# Patient Record
Sex: Female | Born: 1935 | Race: White | Hispanic: No | Smoking: Never smoker
Health system: Southern US, Community
[De-identification: ages and names within clinical notes are randomized; demographics above are authoritative.]

## PROBLEM LIST (undated history)

## (undated) DIAGNOSIS — E785 Hyperlipidemia, unspecified: Secondary | ICD-10-CM

## (undated) DIAGNOSIS — F329 Major depressive disorder, single episode, unspecified: Secondary | ICD-10-CM

## (undated) DIAGNOSIS — F32A Depression, unspecified: Secondary | ICD-10-CM

## (undated) DIAGNOSIS — I10 Essential (primary) hypertension: Secondary | ICD-10-CM

## (undated) DIAGNOSIS — K851 Biliary acute pancreatitis without necrosis or infection: Secondary | ICD-10-CM

## (undated) DIAGNOSIS — F0390 Unspecified dementia without behavioral disturbance: Secondary | ICD-10-CM

## (undated) DIAGNOSIS — C569 Malignant neoplasm of unspecified ovary: Secondary | ICD-10-CM

## (undated) DIAGNOSIS — I4891 Unspecified atrial fibrillation: Secondary | ICD-10-CM

## (undated) DIAGNOSIS — I351 Nonrheumatic aortic (valve) insufficiency: Secondary | ICD-10-CM

## (undated) HISTORY — PX: ABDOMINAL HYSTERECTOMY: SHX81

---

## 2000-05-17 ENCOUNTER — Encounter: Admission: RE | Admit: 2000-05-17 | Discharge: 2000-05-17 | Payer: Self-pay

## 2000-05-24 ENCOUNTER — Encounter: Admission: RE | Admit: 2000-05-24 | Discharge: 2000-05-24 | Payer: Self-pay | Admitting: *Deleted

## 2000-05-24 ENCOUNTER — Encounter: Payer: Self-pay | Admitting: *Deleted

## 2000-09-09 ENCOUNTER — Ambulatory Visit (HOSPITAL_COMMUNITY): Admission: RE | Admit: 2000-09-09 | Discharge: 2000-09-09 | Payer: Self-pay | Admitting: *Deleted

## 2000-09-09 ENCOUNTER — Encounter (INDEPENDENT_AMBULATORY_CARE_PROVIDER_SITE_OTHER): Payer: Self-pay

## 2000-09-09 ENCOUNTER — Encounter: Payer: Self-pay | Admitting: *Deleted

## 2000-11-27 ENCOUNTER — Emergency Department (HOSPITAL_COMMUNITY): Admission: EM | Admit: 2000-11-27 | Discharge: 2000-11-27 | Payer: Self-pay

## 2000-12-04 ENCOUNTER — Emergency Department (HOSPITAL_COMMUNITY): Admission: EM | Admit: 2000-12-04 | Discharge: 2000-12-04 | Payer: Self-pay | Admitting: Emergency Medicine

## 2001-04-30 ENCOUNTER — Emergency Department (HOSPITAL_COMMUNITY): Admission: EM | Admit: 2001-04-30 | Discharge: 2001-04-30 | Payer: Self-pay | Admitting: Emergency Medicine

## 2001-04-30 ENCOUNTER — Encounter: Payer: Self-pay | Admitting: Emergency Medicine

## 2001-07-16 ENCOUNTER — Encounter: Admission: RE | Admit: 2001-07-16 | Discharge: 2001-07-16 | Payer: Self-pay | Admitting: Orthopedic Surgery

## 2001-07-16 ENCOUNTER — Encounter: Payer: Self-pay | Admitting: Orthopedic Surgery

## 2001-07-17 ENCOUNTER — Ambulatory Visit (HOSPITAL_BASED_OUTPATIENT_CLINIC_OR_DEPARTMENT_OTHER): Admission: RE | Admit: 2001-07-17 | Discharge: 2001-07-17 | Payer: Self-pay | Admitting: Orthopedic Surgery

## 2001-09-09 ENCOUNTER — Encounter (INDEPENDENT_AMBULATORY_CARE_PROVIDER_SITE_OTHER): Payer: Self-pay | Admitting: *Deleted

## 2003-11-08 ENCOUNTER — Emergency Department (HOSPITAL_COMMUNITY): Admission: EM | Admit: 2003-11-08 | Discharge: 2003-11-08 | Payer: Self-pay | Admitting: Family Medicine

## 2004-12-17 ENCOUNTER — Encounter (INDEPENDENT_AMBULATORY_CARE_PROVIDER_SITE_OTHER): Payer: Self-pay | Admitting: *Deleted

## 2004-12-18 ENCOUNTER — Inpatient Hospital Stay (HOSPITAL_COMMUNITY): Admission: EM | Admit: 2004-12-18 | Discharge: 2004-12-22 | Payer: Self-pay | Admitting: Emergency Medicine

## 2005-01-24 ENCOUNTER — Ambulatory Visit: Payer: Self-pay | Admitting: Cardiology

## 2005-01-29 ENCOUNTER — Ambulatory Visit: Payer: Self-pay | Admitting: Nurse Practitioner

## 2005-03-21 ENCOUNTER — Encounter (INDEPENDENT_AMBULATORY_CARE_PROVIDER_SITE_OTHER): Payer: Self-pay | Admitting: Specialist

## 2005-03-22 ENCOUNTER — Inpatient Hospital Stay (HOSPITAL_COMMUNITY): Admission: RE | Admit: 2005-03-22 | Discharge: 2005-03-25 | Payer: Self-pay | Admitting: Surgery

## 2005-03-22 ENCOUNTER — Encounter (INDEPENDENT_AMBULATORY_CARE_PROVIDER_SITE_OTHER): Payer: Self-pay | Admitting: *Deleted

## 2005-03-25 ENCOUNTER — Encounter (INDEPENDENT_AMBULATORY_CARE_PROVIDER_SITE_OTHER): Payer: Self-pay | Admitting: *Deleted

## 2006-05-24 ENCOUNTER — Encounter: Admission: RE | Admit: 2006-05-24 | Discharge: 2006-05-24 | Payer: Self-pay | Admitting: Orthopaedic Surgery

## 2006-05-29 ENCOUNTER — Emergency Department (HOSPITAL_COMMUNITY): Admission: EM | Admit: 2006-05-29 | Discharge: 2006-05-29 | Payer: Self-pay | Admitting: Emergency Medicine

## 2006-06-10 ENCOUNTER — Emergency Department (HOSPITAL_COMMUNITY): Admission: EM | Admit: 2006-06-10 | Discharge: 2006-06-10 | Payer: Self-pay | Admitting: Emergency Medicine

## 2006-08-02 ENCOUNTER — Emergency Department (HOSPITAL_COMMUNITY): Admission: EM | Admit: 2006-08-02 | Discharge: 2006-08-02 | Payer: Self-pay | Admitting: Emergency Medicine

## 2006-08-12 IMAGING — RF DG CHOLANGIOGRAM OPERATIVE
1 series · 12 of 12 positions shown · non-contrast
Comparison: none

CLINICAL DATA: Cholecystectomy for gallstones.  
 INTRAOPERATIVE CHOLANGIOGRAM:

[Series 1: run · 3 acquisitions, 12 frames shown]
[im 1/3]
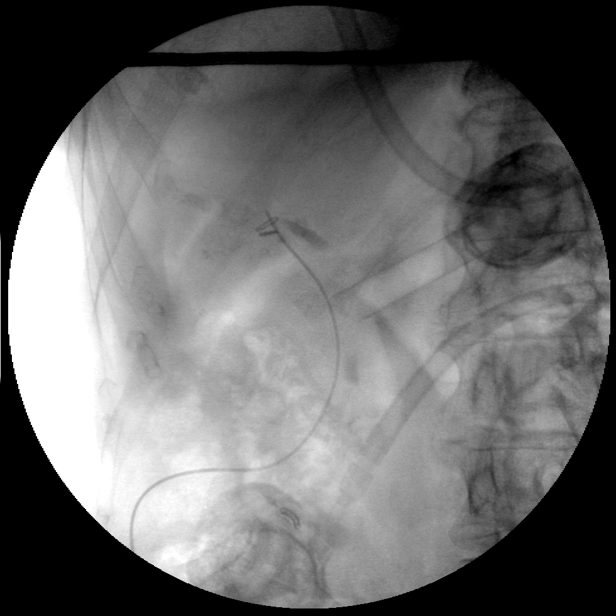
[im 1/3]
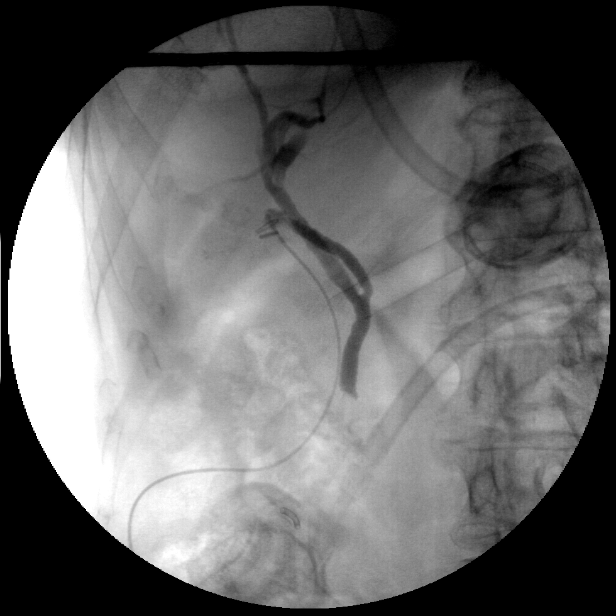
[im 1/3]
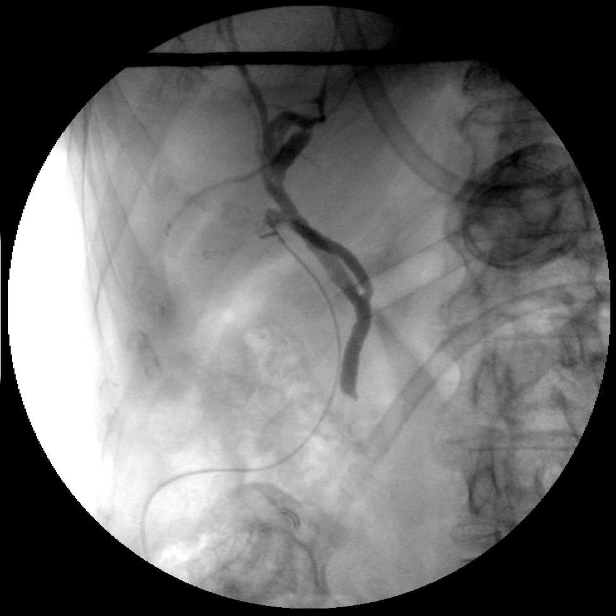
[im 1/3]
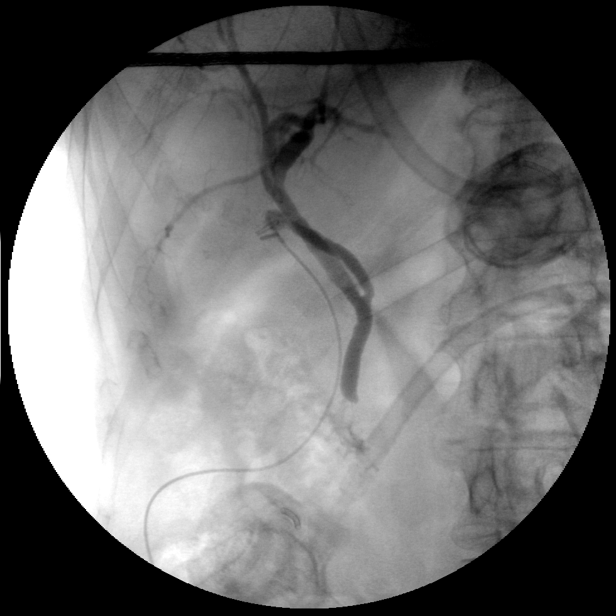
[im 2/3]
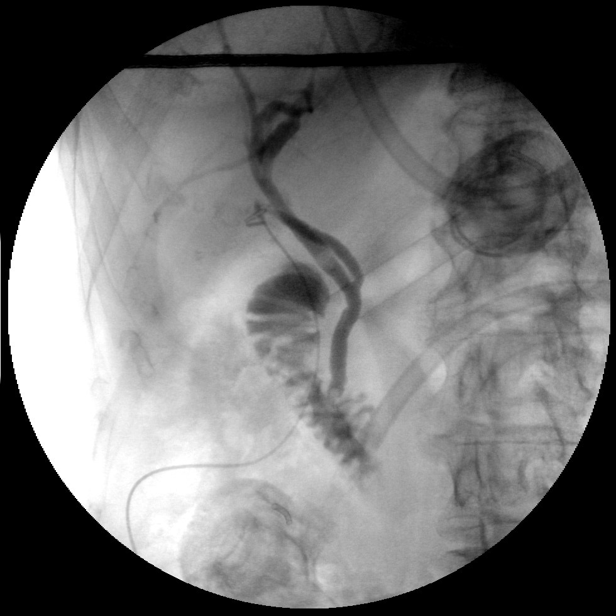
[im 2/3]
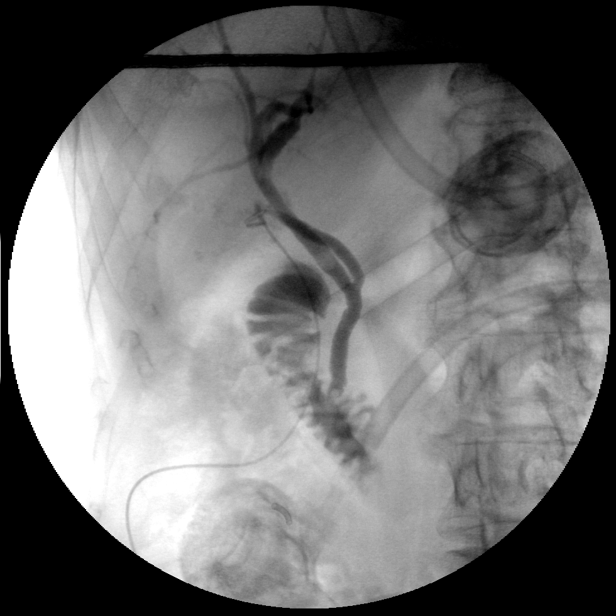
[im 2/3]
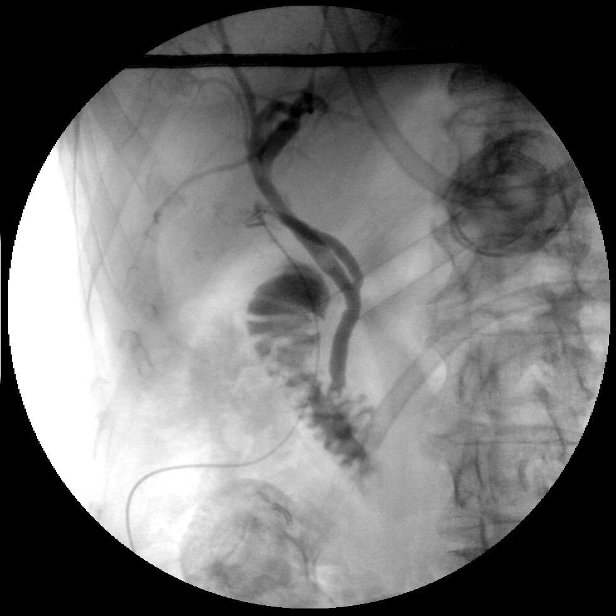
[im 2/3]
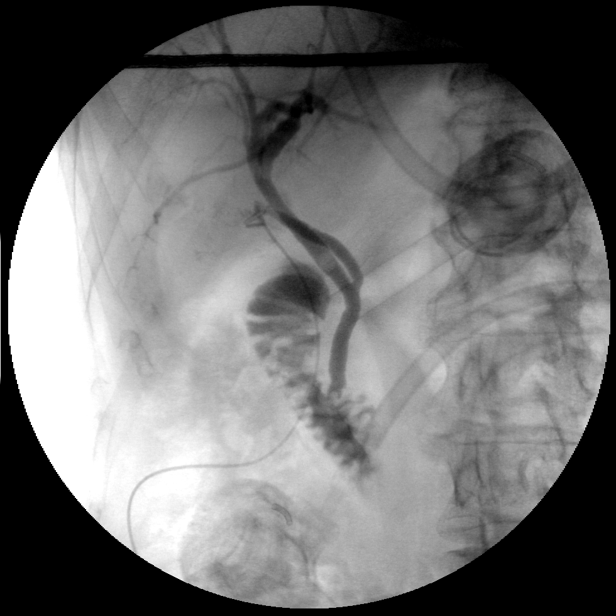
[im 3/3]
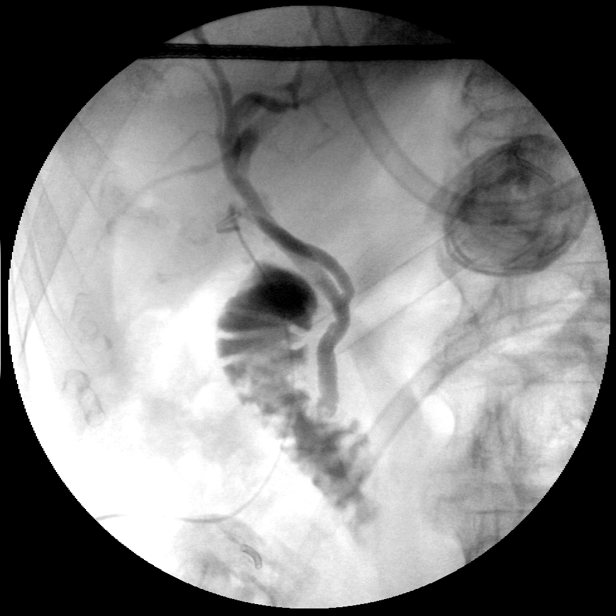
[im 3/3]
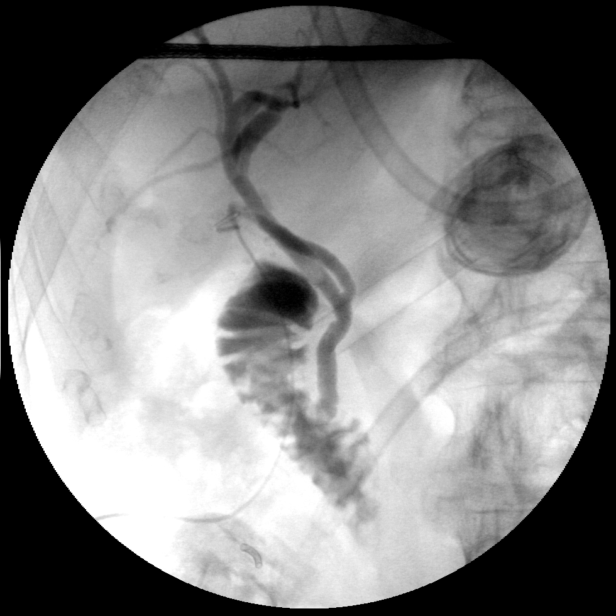
[im 3/3]
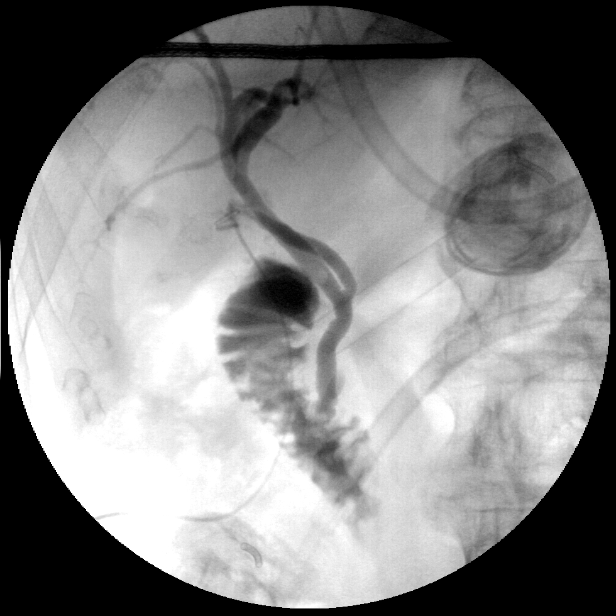
[im 3/3]
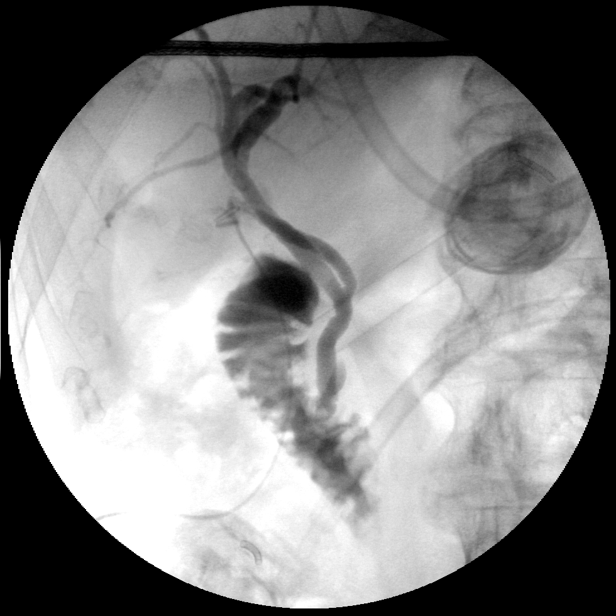

[12 of 12 positions shown; findings below may reference images not displayed]

FINDINGS: Imaging obtained with a C-arm demonstrates contrast injection run from intraoperative cholangiogram.  There is variant elongated cystic duct with low insertion.  There is persistent suggestion of a small rounded focal filling defect in the distal common bile duct just above the ampulla which is not obstructive to the flow of contrast to the duodenum.  Focal small retained calculus is not excluded.  The rest of the opacified biliary tree shows no filling defects.  No extravasation of contrast material is present.
IMPRESSION: Intraoperative cholangiogram shows suggestion of rounded filling defect in the distal common bile duct potentially representing a small retained calculus.  There is incidental elongated cystic duct with low insertion.

## 2006-08-16 ENCOUNTER — Encounter: Admission: RE | Admit: 2006-08-16 | Discharge: 2006-08-16 | Payer: Self-pay | Admitting: Orthopaedic Surgery

## 2006-12-13 ENCOUNTER — Emergency Department (HOSPITAL_COMMUNITY): Admission: EM | Admit: 2006-12-13 | Discharge: 2006-12-14 | Payer: Self-pay | Admitting: Emergency Medicine

## 2006-12-23 ENCOUNTER — Emergency Department (HOSPITAL_COMMUNITY): Admission: EM | Admit: 2006-12-23 | Discharge: 2006-12-23 | Payer: Self-pay | Admitting: Emergency Medicine

## 2007-02-19 ENCOUNTER — Encounter: Admission: RE | Admit: 2007-02-19 | Discharge: 2007-02-19 | Payer: Self-pay | Admitting: Internal Medicine

## 2007-07-04 ENCOUNTER — Ambulatory Visit: Payer: Self-pay | Admitting: Cardiology

## 2007-07-04 LAB — CONVERTED CEMR LAB
ALT: 13 units/L (ref 0–35)
AST: 20 units/L (ref 0–37)
Bilirubin, Direct: 0.1 mg/dL (ref 0.0–0.3)
Cholesterol: 162 mg/dL (ref 0–200)
HDL: 55.5 mg/dL (ref >39.0)
Total Bilirubin: 0.6 mg/dL (ref 0.3–1.2)
Total Protein: 6.6 g/dL (ref 6.0–8.3)

## 2007-07-15 ENCOUNTER — Ambulatory Visit: Payer: Self-pay

## 2007-09-02 ENCOUNTER — Ambulatory Visit: Payer: Self-pay | Admitting: Cardiovascular Disease

## 2007-10-08 ENCOUNTER — Encounter: Admission: RE | Admit: 2007-10-08 | Discharge: 2007-10-08 | Payer: Self-pay | Admitting: Orthopedic Surgery

## 2007-10-10 ENCOUNTER — Ambulatory Visit (HOSPITAL_BASED_OUTPATIENT_CLINIC_OR_DEPARTMENT_OTHER): Admission: RE | Admit: 2007-10-10 | Discharge: 2007-10-10 | Payer: Self-pay | Admitting: Orthopedic Surgery

## 2008-03-03 ENCOUNTER — Encounter: Admission: RE | Admit: 2008-03-03 | Discharge: 2008-03-03 | Payer: Self-pay | Admitting: Internal Medicine

## 2008-04-16 DIAGNOSIS — E782 Mixed hyperlipidemia: Secondary | ICD-10-CM | POA: Insufficient documentation

## 2008-04-16 DIAGNOSIS — M79609 Pain in unspecified limb: Secondary | ICD-10-CM | POA: Insufficient documentation

## 2008-04-16 DIAGNOSIS — I10 Essential (primary) hypertension: Secondary | ICD-10-CM | POA: Insufficient documentation

## 2008-04-16 DIAGNOSIS — R079 Chest pain, unspecified: Secondary | ICD-10-CM | POA: Insufficient documentation

## 2008-07-13 ENCOUNTER — Encounter: Payer: Self-pay | Admitting: Internal Medicine

## 2008-07-14 ENCOUNTER — Encounter: Payer: Self-pay | Admitting: Internal Medicine

## 2008-07-14 ENCOUNTER — Ambulatory Visit: Payer: Self-pay | Admitting: Internal Medicine

## 2008-07-14 ENCOUNTER — Inpatient Hospital Stay (HOSPITAL_COMMUNITY): Admission: AD | Admit: 2008-07-14 | Discharge: 2008-07-17 | Payer: Self-pay | Admitting: Internal Medicine

## 2008-07-14 ENCOUNTER — Telehealth: Payer: Self-pay | Admitting: Internal Medicine

## 2008-07-14 DIAGNOSIS — K219 Gastro-esophageal reflux disease without esophagitis: Secondary | ICD-10-CM | POA: Insufficient documentation

## 2008-07-14 DIAGNOSIS — D649 Anemia, unspecified: Secondary | ICD-10-CM

## 2008-07-14 DIAGNOSIS — I739 Peripheral vascular disease, unspecified: Secondary | ICD-10-CM | POA: Insufficient documentation

## 2008-07-14 DIAGNOSIS — F329 Major depressive disorder, single episode, unspecified: Secondary | ICD-10-CM

## 2008-07-14 DIAGNOSIS — K921 Melena: Secondary | ICD-10-CM | POA: Insufficient documentation

## 2008-07-14 DIAGNOSIS — M1990 Unspecified osteoarthritis, unspecified site: Secondary | ICD-10-CM | POA: Insufficient documentation

## 2008-07-14 DIAGNOSIS — M810 Age-related osteoporosis without current pathological fracture: Secondary | ICD-10-CM | POA: Insufficient documentation

## 2008-07-14 DIAGNOSIS — Z8719 Personal history of other diseases of the digestive system: Secondary | ICD-10-CM | POA: Insufficient documentation

## 2008-07-14 DIAGNOSIS — C569 Malignant neoplasm of unspecified ovary: Secondary | ICD-10-CM

## 2008-07-15 ENCOUNTER — Encounter: Payer: Self-pay | Admitting: Gastroenterology

## 2009-01-12 ENCOUNTER — Encounter: Admission: RE | Admit: 2009-01-12 | Discharge: 2009-01-12 | Payer: Self-pay | Admitting: Rheumatology

## 2009-01-23 ENCOUNTER — Inpatient Hospital Stay (HOSPITAL_COMMUNITY): Admission: EM | Admit: 2009-01-23 | Discharge: 2009-01-28 | Payer: Self-pay | Admitting: Emergency Medicine

## 2010-08-11 LAB — BASIC METABOLIC PANEL
CO2: 28 mEq/L (ref 19–32)
Calcium: 8.4 mg/dL (ref 8.4–10.5)
Calcium: 8.7 mg/dL (ref 8.4–10.5)
GFR calc Af Amer: >60 mL/min (ref >60)
GFR calc Af Amer: >60 mL/min (ref >60)
GFR calc non Af Amer: >60 mL/min (ref >60)
GFR calc non Af Amer: >60 mL/min (ref >60)
Sodium: 137 mEq/L (ref 135–145)
Sodium: 138 mEq/L (ref 135–145)

## 2010-08-11 LAB — URINE MICROSCOPIC-ADD ON

## 2010-08-11 LAB — DIFFERENTIAL
Lymphocytes Relative: 6 % — ABNORMAL LOW (ref 12–46)
Monocytes Absolute: 0.4 10*3/uL (ref 0.1–1.0)
Monocytes Relative: 3 % (ref 3–12)
Neutro Abs: 11.1 10*3/uL — ABNORMAL HIGH (ref 1.7–7.7)

## 2010-08-11 LAB — CARDIAC PANEL(CRET KIN+CKTOT+MB+TROPI)
CK, MB: 2.4 ng/mL (ref 0.3–4.0)
CK, MB: 3.8 ng/mL (ref 0.3–4.0)
Total CK: 130 U/L (ref 7–177)
Total CK: 155 U/L (ref 7–177)
Troponin I: 0.01 ng/mL (ref 0.00–0.06)

## 2010-08-11 LAB — URINALYSIS, ROUTINE W REFLEX MICROSCOPIC
Bilirubin Urine: NEGATIVE
Ketones, ur: NEGATIVE mg/dL
Nitrite: NEGATIVE
Protein, ur: NEGATIVE mg/dL
Urobilinogen, UA: 0.2 mg/dL (ref 0.0–1.0)
pH: 6.5 (ref 5.0–8.0)

## 2010-08-11 LAB — CULTURE, BLOOD (ROUTINE X 2)
Culture: NO GROWTH
Culture: NO GROWTH

## 2010-08-11 LAB — CBC
Hemoglobin: 13.4 g/dL (ref 12.0–15.0)
Hemoglobin: 14.4 g/dL (ref 12.0–15.0)
MCHC: 33.8 g/dL (ref 30.0–36.0)
RBC: 4.21 MIL/uL (ref 3.87–5.11)
RBC: 4.53 MIL/uL (ref 3.87–5.11)
RDW: 13.6 % (ref 11.5–15.5)

## 2010-08-11 LAB — VITAMIN B12: Vitamin B-12: 400 pg/mL (ref 211–911)

## 2010-08-11 LAB — TSH: TSH: 2.987 u[IU]/mL (ref 0.350–4.500)

## 2010-08-17 LAB — DIFFERENTIAL
Basophils Absolute: 0.1 10*3/uL (ref 0.0–0.1)
Eosinophils Absolute: 0.1 10*3/uL (ref 0.0–0.7)
Lymphs Abs: 1 10*3/uL (ref 0.7–4.0)
Monocytes Absolute: 0.4 10*3/uL (ref 0.1–1.0)
Neutro Abs: 3.5 10*3/uL (ref 1.7–7.7)

## 2010-08-17 LAB — CBC
HCT: 30.2 % — ABNORMAL LOW (ref 36.0–46.0)
Hemoglobin: 10 g/dL — ABNORMAL LOW (ref 12.0–15.0)
Hemoglobin: 10 g/dL — ABNORMAL LOW (ref 12.0–15.0)
Hemoglobin: 6.7 g/dL — CL (ref 12.0–15.0)
MCHC: 32.9 g/dL (ref 30.0–36.0)
MCV: 71.9 fL — ABNORMAL LOW (ref 78.0–100.0)
MCV: 77 fL — ABNORMAL LOW (ref 78.0–100.0)
Platelets: 292 10*3/uL (ref 150–400)
RBC: 3.85 MIL/uL — ABNORMAL LOW (ref 3.87–5.11)
RBC: 3.91 MIL/uL (ref 3.87–5.11)
RDW: 21 % — ABNORMAL HIGH (ref 11.5–15.5)
WBC: 5.5 10*3/uL (ref 4.0–10.5)
WBC: 5.6 10*3/uL (ref 4.0–10.5)

## 2010-08-17 LAB — URINALYSIS, ROUTINE W REFLEX MICROSCOPIC
Bilirubin Urine: NEGATIVE
Ketones, ur: NEGATIVE mg/dL
Protein, ur: NEGATIVE mg/dL
Specific Gravity, Urine: 1.01 (ref 1.005–1.030)

## 2010-08-17 LAB — CROSSMATCH

## 2010-08-17 LAB — PROTIME-INR
INR: 1.1 (ref 0.00–1.49)
Prothrombin Time: 14 seconds (ref 11.6–15.2)

## 2010-08-17 LAB — COMPREHENSIVE METABOLIC PANEL
AST: 19 U/L (ref 0–37)
Albumin: 3.6 g/dL (ref 3.5–5.2)
Alkaline Phosphatase: 64 U/L (ref 39–117)
BUN: 21 mg/dL (ref 6–23)
CO2: 25 mEq/L (ref 19–32)
Chloride: 108 mEq/L (ref 96–112)
Potassium: 3.7 mEq/L (ref 3.5–5.1)
Total Bilirubin: 0.4 mg/dL (ref 0.3–1.2)

## 2010-08-17 LAB — APTT: aPTT: 30 seconds (ref 24–37)

## 2010-08-17 LAB — TSH: TSH: 2.712 u[IU]/mL (ref 0.350–4.500)

## 2010-08-17 LAB — IRON: Iron: <10 ug/dL — ABNORMAL LOW (ref 42–135)

## 2010-08-17 LAB — URINE MICROSCOPIC-ADD ON

## 2010-09-19 NOTE — Discharge Summary (Signed)
[name], [name]                 ACCOUNT NO.:  [id number]   MEDICAL RECORD NO.:  [id number]           PATIENT TYPE:   LOCATION:                                 FACILITY:   PHYSICIAN:  Hedwig Morton. Juanda Chance, MD     DATE OF BIRTH:  Sep 12, 1918   DATE OF ADMISSION:  DATE OF DISCHARGE:                               DISCHARGE SUMMARY   ADMITTING DIAGNOSES:  1. Severe iron-deficiency anemia, she is microcytic.  She is Hemoccult      positive and has a self-reported history of black tarry stools.      Her BUN is also elevated.  All of which point to an upper      gastrointestinal source for her bleeding.  This may be secondary to      aspirin-induced ulcer, arteriovenous malformations, or neoplasia.      It is less likely that she has a lower gastrointestinal source,      such as, a large polyp or colon cancer or other lower      gastrointestinal abnormality.  Anemia symptomatic.  She is quite      weak.  2. History of pancreatitis:  She underwent in November 2006 endoscopic      retrograde cholangiopancreatography with sphincterotomy by Dr.      Evette Cristal.  3. Status post in November 2006 cholelithiasis, this led to gallstone      pancreatitis.  Laparoscopic cholecystectomy was performed by Dr.      Corliss Skains.  4. Peripheral vascular disease.  5. History of gastroesophageal reflux disease when the upper endoscope      by Dr. Virginia Rochester, in 2003.  6. Osteoarthritis.  7. Osteoporosis.  8. Depression.  9. Hypertension.  10.Hyperlipidemia.  11.Status post hysterectomy in 1966 secondary to cervical cancer.  12.Status post remote appendectomy.  13.History of fracture of left upper extremity for which she underwent      open reduction and internal fixation.  14.History of adhesive capsulitis of the left shoulder.  She underwent      closed manipulation of the left shoulder under anesthesia in 2003.  15.Status post history of bilateral cataract surgery.   DISCHARGE DIAGNOSES:  1. Upper gastrointestinal  bleed.  The exact source of the bleed is      indeterminate at the time of this dictation, but it is suspected to      be secondary to arteriovenous malformations.  2. Status post upper endoscopy on July 15, 2008, by Dr. Melvia Heaps.  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  This was fulgurated      with the APC coagulator.  Hiatal hernia also seen on endoscopy.  3. Status post colonoscopy on July 15, 2008.  This study into the      distal ileum did not show any pathology.  4. Capsule endoscopy being performed currently at the time of this      dictation with findings to be determined within the next few days.  5. Iron-deficiency anemia, suggestive  of chronic blood loss:  She      underwent transfusion with 2 units of packed red blood cells during      this admission with post transfusion blood counts stable.  6. Chest x-ray showing a possible 6-mm left lower lobe nodule.  CT      scan was performed, showed inflammatory changes involving lingular      segment, which is likely chronic and subacute in nature, possibly      this could represented an atypical mycobacterial infection of the      lungs.   LABORATORY DATA:  Hemoglobin 6.7 on admission, 10.0 at discharge.  Hematocrit 20.2 on admission, 29.6 at discharge.  MCV is 71.9.  Platelet  count 292,000.  White blood cell count 5.1.  ESR was 14.  PT 14.0, INR  1.1, PTT 30.  Sodium 141, potassium 3.7, chloride 108, CO2 25, glucose  117, BUN 21, creatinine 0.68.  LFTs within normal limits.  TSH level  2.712.  Iron less than 10.  Total iron binding capacity and iron  saturation were not calculated.  Urinalysis was displayed 0-2 white  blood cells, 0-2 red blood cells per high-power field, rare bacteria and  trace leukocytes present.   X-RAYS:  1. Single-view abdomen showed no abnormalities.  2. Two-view chest showed possible 6-mm left lower lobe nodule.  CT       suggested to further evaluate.  3. CT chest with contrast showed inflammatory changes involving the      lingular segment, which are likely subacute and chronic in nature.      One possibility would be atypical mycobacterial infection of the      lung.   BRIEF HISTORY:  [name] is an 77 year old white female who presented  to her primary physician, Dr. Ethelene Browns Anderson's office for evaluation  of fatigue and recent history of dark stools.  He obtained blood cells,  and the hemoglobin returned at 7.3.  Her usual GI doctor is Dr. Virginia Rochester.  Dr. Virginia Rochester was out of town, so Dr. Dareen Piano referred her for evaluation to  Dr. Juanda Chance.  She was added on for evaluation and ended up being admitted  to the hospital for transfusion and workup of the anemia and a history  of dark tarry stools and heme positivity on fecal occult blood testing.  Her MCV was low.   The patient gave a history of having been taking 2-4 aspirin daily until  about a week prior to  presenting to the office and subsequently being  admitted to the hospital.   HOSPITAL COURSE:  The patient was admitted to nontelemetry bed.  She was  transfused with a total of 2 units of packed red blood cells.  The  initial hemoglobin checks showed a measurement of 6.7, it rose to 10  with the transfusion.  It remained stable at 10 on recheck.   The patient underwent the upper endoscopy and colonoscopy with finding  of a duodenal AVM.  There was a suspicion for further AVMs, deeper  within the small bowel and therefore, underwent capsule endoscopy,  performed on July 16, 2008.  At the time of this dictation, the capsule  endoscopy is still in progress and images will be downloaded within the  next few days and readings to take place thereafter.   The patient also had an irregular finding on her chest x-ray.  This was  followed up with a CT scan, which raised the remote possibility of an  atypical  mycobacterial infection.  The patient has not had  fever,  chills, sweats, weight loss, or respiratory problems and it is felt  unlikely that she has any sort of mycobacterial infection.  However, she  probably should have a PPD placed.  Since the plan is to send her home  soon, we will let Dr. Dareen Piano supervise any placement of PPDs or  further workup of the lung findings.  The concern is that if she has a  PPD placed here today that she will not get followed up with a reading  within the required a 3-day period.   The patient's diet was advanced after the colonoscopy prep to a regular  diet, which she tolerated well.  She was still complaining of some  weakness but nothing like that she had been experiencing prior to  arrival.  Her stools turned to brown.  She was in stable condition and  felt ready for discharge following completion of the capsule endoscopy.  As this may be later today, it is possible that she will go home on  July 17, 2008, rather than July 16, 2008.   MEDICATIONS AT DISCHARGE:  1. Pentoxifylline ER 400 mg once daily.  2. Pravastatin 80 mg once daily.  3. Omeprazole 20 mg once daily.  4. Venlafaxine HCl 75 mg twice daily.  5. Atenolol 25 mg once daily.  6. Colchicine 0.6 mg once daily.  7. Lorazepam 1 mg at bedtime.  8. Tramadol 50 mg 1 twice daily as needed.  9. Actonel 35 mg once a week every Monday.  10.Os-Cal 500 mg with vitamin D once daily.   The patient is to continue not to use aspirin products.   DIET AT DISCHARGE:  Regular.   FOLLOWUP OFFICE VISIT:  1. She should follow up with Dr. Vivien Rossetti office for a CBC within      the next 10 days.  2. Depending on what is found on the reading of the capsule endoscopy,      she may need to follow up with Dr. Virginia Rochester.  3. Again, she should follow up regarding whether or not she needs a      PPD placed with Dr. Leatha Gilding within the next 10 days.      Jennye Moccasin, PA-C      Hedwig Morton. Juanda Chance, MD  Electronically Signed    SG/MEDQ  D:  07/16/2008   T:  07/17/2008  Job:  [id number]   cc:   Georgiana Spinner, M.D.  Dr. Lurena Nida

## 2010-09-19 NOTE — Op Note (Signed)
NAMEWILFRED, SIVERSON                 ACCOUNT NO.:  [id number]   MEDICAL RECORD NO.:  [id number]          PATIENT TYPE:  AMB   LOCATION:  DSC                          FACILITY:  [facility]   PHYSICIAN:  Katy Fitch. Sypher, M.D. DATE OF BIRTH:  [birth year]   DATE OF PROCEDURE:  10/10/2007  DATE OF DISCHARGE:                               OPERATIVE REPORT   PREOPERATIVE DIAGNOSIS:  Severely comminuted shortened fracture of left  distal radial metaphysis and distal ulnar metaphysis.   POSTOPERATIVE DIAGNOSIS:  Severely comminuted shortened fracture of left  distal radial metaphysis and distal ulnar metaphysis.   OPERATIONS:  Open reduction and internal fixation of left radius  utilizing a six peg DVR plate system and a closed/open reduction of the  left ulna without supplemental fixation.   OPERATING SURGEON:  Josephine Igo, MD   ASSISTANT:  Marveen Reeks. Dasnoit PA-C   ANESTHESIA:  Left infraclavicular block supplemented by general  anesthesia by LMA.   SUPERVISING ANESTHESIOLOGIST:  Janetta Hora. Gelene Mink, MD   INDICATIONS:  Quaniya Damas is a 88-year woman referred through her family  for evaluation and management of a very comminuted left distal double  bone forearm fracture.   Ms. Laureano has been a long-term patient of our practice.   She fell while visiting [place], [place] on Oct 02, 2007.  She is  seen at [facility] where her comminuted fracture was identified and  splinted.  She was advised to seek a hand surgery follow-up.  Her family  brought her for an upper extremity orthopedic consult at our office this  week.  Arrangements were made for definitive open reduction and internal  fixation of her fracture at this time.   Alternative treatment strategies including closed reduction, bridge  plating and external fixation were discussed and all rejected in favor  of volar plating.  But we advised her that we would either closed  reduce, open reduced or plate/pin her ulna.   At  the time of our reduction, we were able to achieve a satisfactory  closed reduction in the ulna, therefore, we will allow the radius to  provide distraction of the ulna at this time.   PROCEDURE:  Zniyah Midkiff is brought to the operating room and placed in  supine position on the table.   Following a anesthesia consult with Dr. Gelene Mink in the holding area, a  left infraclavicular block was placed without complication.  She was  subsequently brought to room 2, placed in supine position on the  operating table and under Dr. Thornton Dales direct supervision, general  anesthesia by LMA technique induced.   The left upper extremity was prepped with Betadine soap solution and  sterilely draped.  Ancef 1 g was administered as an IV prophylactic  antibiotic one hour prior to surgery.   The arm was carefully exsanguinated with an Esmarch bandage and arterial  tourniquet inflated to 230 mmHg.  The procedure commenced with extended  DVR extensile exposure to allow visualization of the pronator flexor  carpi radialis tendon and the palmaris longus.  The sheath of the flexor  carpi  radialis was split longitudinally and the flexor pollicis longus  retracted in ulnar direction.  The pronator quadratus was meticulously  elevated off the volar aspect of the radius as there was marked  comminution of the volar cortex with several fragments more than 90  degrees malrotated.   Ultimately, the volar cortex was reassembled anatomically like a jigsaw  puzzle.  At least, five fragments constituted the anterior cortex of the  radius.   A six peg DVR plate system was applied and placed with a provisional  screw in the sliding hole..  The C-arm fluoroscope was used to gain  length and adjust the proper position of the plate.  Ultimately, a  combination of four smooth pegs radially and two threaded pegs ulnarly  were placed.  Anatomic reduction of the radius was achieved.   With gentle closed manipulation  followed by open manipulation of the  radius and ulna, we achieved a virtually anatomic reduction of the ulna.   Given an intact interosseous membrane and periosteal sleeve and my  judgment plating or pin fixation of the ulna was not likely to improve  the outcome, therefore we deferred.   The wound was then inspected for bleeding points which were  electrocauterized with bipolar current followed by repair of the  pronator quadratus over the plate with figure-of-eight suture of 0  Vicryl followed by repair of the skin with subcutaneous suture of 4-0  Vicryl and intradermal 3-0 Prolene.  A voluminous gauze dressing was  applied with a sugar-tong splint.  Ms. Kahan tolerated the surgery and  anesthesia well.  She was transferred to the recovery room with stable  signs.   I will see her back for followup in 5 days at which time I will remove  her dressing and place her on a very light weight plastic splint.      Katy Fitch Sypher, M.D.  Electronically Signed     RVS/MEDQ  D:  10/10/2007  T:  10/11/2007  Job:  [id number]   cc:   Miguel Aschoff, M.D.

## 2010-09-19 NOTE — Assessment & Plan Note (Signed)
[facility]                            CARDIOLOGY OFFICE NOTE   NAME:Brown, Maria WIEDEL                        MRN:          [id number]  DATE:07/04/2007                            DOB:          [birth year]    Mrs. Mcelhinny is a pleasant 77 year old female who presents for evaluation  of chest pain, lower extremity pain, lower extremity edema, and  hyperlipidemia.  She was seen in his office in September 2006 for  preoperative consultation.  She apparently has a history of peripheral  vascular disease not requiring any intervention, but I do not have those  records available.  She does not have dyspnea on exertion, orthopnea or  PND.  She does not have palpitations.  She occasionally feels  presyncopal when she stands but otherwise has not had syncope.  She does  state that she occasionally has chest pain.  The pain is in the left  lower chest/left upper quadrant area.  She finds it difficult described  but it states that lasts for approximately 5 minutes.  Is not exertional  nor is it pleuritic positional.  Is not related to food.  There is no  associated nausea, vomiting, shortness of breath, or diaphoresis.  The  pain does not radiate.  She also complains of some swelling in her left  lower extremity.  She states she has had some swelling for quite some  time related to varicose veins.  It has worsened but she is not wearing  her support hose.  It does get worse through the day and improve  overnight.  She also has some pain in her right upper shoulder and right  hand.  She has not seen a physician in over 1 year.  Because of that,  she presented for further evaluation.  Her medications include atenolol  25 mg daily, pentoxifylline 40 mg p.o. daily, Pravachol 80 mg daily,  omeprazole 20 mg daily, Actonel 35 mg weekly, fexofenadine 75 mg daily,  lorazepam 1 mg as needed, aspirin, and Os-Cal.  She has an allergy to  HYDROCODONE.   SOCIAL HISTORY:  She does  not smoke nor does she consume alcohol.   FAMILY HISTORY:  Negative for coronary disease in her immediate family.   PAST MEDICAL HISTORY:  There is no diabetes mellitus but there is  hypertension and hyperlipidemia.  She has history of gastric reflux  disease.  She also has arthritis.  She has had a prior hysterectomy and  appendectomy.  She has a history of gallstone pancreatitis that required  cholecystectomy.  There is a question history of peripheral vascular  disease.  There was a history of depression as well.   REVIEW OF SYSTEMS:  She denies any headaches, fevers, chills.  No  productive cough or hemoptysis.  There is no dysphagia melena or  hematochezia.  There is no dysuria or seizure activity.  There is no  orthopnea or PND but there is mild pedal edema.  She also complains of  pain in her left lower extremity.  This is from the knee down.  Occurs  when standing  but she states it tingles and aches most of the time.  It  does not change with exertion.  She does not have exertional leg pain.  Remaining systems are negative.   PHYSICAL EXAMINATION:  Today shows a heart rate is 83.  BP 134/67.  She is well-developed, well-nourished in no acute distress.  She weighs  142 pounds.  Her skin is warm and dry.  She is not depressed.  There is no peripheral  clubbing.  She has somewhat of a flat affect.  BACK:  Normal.  HEENT:  Normal with normal eyelids.  Neck is supple with normal upstroke bilaterally.  No bruits noted.  There is no jugular distention and no thyromegaly noted.  Chest is clear to auscultation.  No expansion.  Her cardiovascular is regular rhythm.  Normal S1-S2.  There are no  murmurs, rubs or gallops noted.  Bowel exam nontender.  Positive bowel sounds.  No hepatosplenomegaly no  mass appreciated.  There is no abdominal bruit.  She has 2+ femoral  pulses bilaterally.  No bruits.  EXTREMITIES:  Show trace edema on the left.  She has varicosities  bilaterally, left  greater than right.  I cannot palpate cords.  She has  a 2+ posterior tibial pulse on the right.  Her posterior tibial and  dorsalis pedis pulse and left is diminished.  NEUROLOGIC:  Grossly intact.   Electrocardiogram shows sinus rhythm at a rate of 83.  There are no  significant ST changes.   DIAGNOSES:  1. Atypical chest pain - her symptoms  the etiology for symptoms      unclear.  We will plan to proceed with a Myoview for [place] for risk      stratification.  Shows normal perfusion we will not pursue further      cardiac evaluation.  2. Leg pain - her symptoms are not classic for claudication.  However,      she does have diminished pulses on the left.  We will schedule her      to have ABI with Dopplers.  We will pursue this further depending      on those results.  She will continue on her aspirin at present as      well as her statin.  3. Hyperlipidemia - she will continue on Pravachol and check lipids      and liver today.  Note she has not had any blood or by a physician      over 1 year.  I discussed importance of close follow-up.  I have      also asked have a primary care physician.  4. Hypertension - blood pressure is adequate controlled.  We will      continue with her atenolol.  5. Next history of gastric reflux disease - she will continue on      omeprazole.  6. Arthritis - she will follow up Dr. Phylliss Bob concerning this issue.   I will see her back on as-needed basis pending the results of her above  studies.     Madolyn Frieze Jens Som, MD, [facility]  Electronically Signed    BSC/MedQ  DD: 07/04/2007  DT: 07/04/2007  Job #: [id number]

## 2010-09-19 NOTE — Progress Notes (Signed)
[facility]                        PERIPHERAL VASCULAR OFFICE NOTE   NAME:[name]                        MRN:          [id number]  DATE:09/02/2007                            DOB:          01/24/1919    PRIMARY CARDIOLOGIST:  Madolyn Frieze. Jens Som, MD, [facility]   REASON FOR CONSULTATION:  Lower extremity peripheral arterial disease.   [name] is a delightful 77 year old woman with hypertension and  hyperlipidemia and peripheral arterial disease.  She complains of  bilateral leg pain worse on the left.  She describes the pain as a  burning and aching sensation that occurs in the lower legs.  It really  does not involve the calves but rather the shin area.  The pain is much  worse on the left in comparison to the right.  Her symptoms occur  intermittently and are not consistently related to exertion.  In fact  she is able to walk without limitation.  She denies any history of  ulcerations or rest pain.  She admits to lower extremity edema that she  relates to varicosities in the legs.  There is no history of stroke or  TIA.   Because of an abnormal pulse exam and complaint of leg pain she  underwent a lower extremity duplex scan on March 10 that showed long  segment bilateral SFA occlusion with reconstitution in the popliteal  arteries bilaterally.  The ABIs are 0.65 bilaterally.   PAST MEDICAL HISTORY:  1. Hypertension.  2. Dyslipidemia.  3. Gastroesophageal reflux disease.  4. Osteoarthritis.  5. Hysterectomy with appendectomy.  6. Gallstone pancreatitis and subsequent cholecystectomy.  7. Peripheral arterial disease as outlined above.   SOCIAL HISTORY:  The patient is widowed.  She lives alone.  She does not  smoke cigarettes or drink alcohol.   FAMILY HISTORY:  There is no history of coronary or peripheral arterial  disease.   REVIEW OF SYSTEMS:  A complete 12 point review of systems was performed  and negative except as detailed  above.   MEDICATIONS:  1. Include atenolol 25 mg daily.  2. Pentoxifylline 400 mg daily.  3. Pravastatin 80 mg daily.  4. Omeprazole 20 mg daily.  5. Actonel 35 mg weekly.  6. Venlafaxine 75 mg daily.  7. Aspirin 81 mg daily.  8. Os-Cal.  9. Multivitamin.   ALLERGIES:  HYDROCODONE.   PHYSICAL EXAMINATION:  GENERAL:  The patient is an alert and oriented  elderly woman in no acute distress.  VITAL SIGNS:  Weight is 146, blood pressure 116/60 on the right, 118/52  on the left.  Heart rate 72, respiratory rate 16.  HEENT:  Normal.  NECK:  Normal carotid upstrokes without bruits.  Jugular venous pressure  is normal.  No thyromegaly or thyroid nodules.  LUNGS:  Lungs are clear bilaterally.  CHEST:  Chest wall normal expansion.  HEART:  Regular rate and rhythm.  The apex is discrete and nondisplaced.  There are no murmurs, gallops or rubs.  ABDOMEN:  Abdomen is soft, nontender, no abdominal bruits.  EXTREMITIES:  There is trace bilateral edema.  There are  bilateral  varicosities.  Pulses femoral 2+ posterior tibial, 2+ right, 1+ left,  dorsalis pedis 1+ right, trace left.  SKIN:  Warm and dry without rash.  There are no areas of skin breakdown.  NEUROLOGIC:  Cranial nerves II-XII are intact.  Strength intact and  equal.   ASSESSMENT:  [name] is an 77 year old woman with atypical leg pain.  This is in the setting of long bilateral SFA occlusions.  While  oftentimes PAD is associated with atypical leg symptoms I really do not  believe her symptoms are related to vascular disease.  With long total  SFA occlusions this would not be favorable for endovascular treatment.  In the absence of critical limb ischemia I would favor observation and  reserve aggressive therapies for the development of rest pain or  critical limb symptoms.  Hopefully with total occlusions and robust  collateral flow she will remain stable and not require any aggressive  interventions.  [name] should have  aggressive medical therapy for  her PAD as a coronary risk equivalent.  She currently is on a beta-  blocker, statin as well as aspirin.   For followup I will plan on seeing her on a p.r.n. basis.  If she  develops further problems I would be happy to see her at anytime.     Veverly Fells. Excell Seltzer, MD  Electronically Signed    MDC/MedQ  DD: 09/11/2007  DT: 09/11/2007  Job #: [id number]   cc:   Madolyn Frieze. Jens Som, MD, [facility]

## 2010-09-22 NOTE — Op Note (Signed)
[name], [name]                 ACCOUNT NO.:  [id number]   MEDICAL RECORD NO.:  [id number]          PATIENT TYPE:  AMB   LOCATION:  DAY                          FACILITY:  [facility]   PHYSICIAN:  Wilmon Arms. Corliss Skains, M.D. DATE OF BIRTH:  26-Oct-1918   DATE OF PROCEDURE:  03/21/2005  DATE OF DISCHARGE:                                 OPERATIVE REPORT   PREOPERATIVE DIAGNOSES:  1.  Symptomatic cholelithiasis.  2.  Recent gallstone pancreatitis.   POSTOPERATIVE DIAGNOSES:  1.  Symptomatic cholelithiasis.  2.  Recent gallstone pancreatitis.   PROCEDURE:  Laparoscopic cholecystectomy with intraoperative cholangiogram.   SURGEON:  Wilmon Arms. Corliss Skains, M.D.   ASSISTANT:  Anselm Pancoast. Zachery Dakins, M.D.   ANESTHESIA:  General endotracheal.   INDICATIONS FOR PROCEDURE:  The patient is an 76 year old female in  reasonable health who was recently hospitalized for pancreatitis. Workup  included an ultrasound showing cholelithiasis but no evidence of common bile  duct dilatation. The patient has done reasonably well since discharge. On  retrospect she did have some episodic right upper quadrant pain associated  with vomiting but this has not recurred since her hospitalization.  Preoperative liver function tests were normal. She also received  preoperative cardiac clearance.   FINDINGS:  At operation a noninflamed gallbladder with some mild omental  adhesions. Cholangiogram showed a long cystic duct with good flow of  contrast proximally and distally in the biliary tree. Three filling defects  were noted, one was proximally in the common bile duct and 2 were distally  at the ampulla. Contrast did flow into the duodenum after giving glucagon.   DESCRIPTION OF PROCEDURE:  The patient was brought to the operating room and  placed in the supine position on the operating room table. She had been  given preoperative antibiotics. After an adequate level of general  endotracheal anesthesia was obtained,  the patient's abdomen was prepped with  Betadine and draped in a sterile fashion. A timeout was then taken to assure  proper patient and proper procedure. Her umbilicus was infiltrated with  0.25% Marcaine. A vertical incision was made just below her umbilicus. The  fascia was grasped with clamps and elevated. It was opened vertically. The  peritoneal cavity was entered bluntly. No adhesions were noted on digital  examination. The #0 Vicryl was used to create a pursestring and the Hasson  cannula was then inserted. Pneumoperitoneum was obtained by insufflating CO2  and maintaining maximum pressure at 15 mmHg. The patient was rotated into  reverse Trendelenburg position, rotated slightly to her left. The  gallbladder was visualized and was noted to be uninflamed. A 10 mm port was  placed in the subxiphoid position. Two 5 mm ports were placed in the right  upper quadrant. The gallbladder was grasped with a clamp and elevated over  the edge of the liver. There were some filmy adhesions to the surface of the  gallbladder and these were taken down with cautery. The cystic duct was  identified, circumferentially dissected and ligated with a clip distally. A  small opening was created on the cystic duct  and the cholangiogram catheter  was inserted through a stab incision and into the cystic duct. This was  secured with a clip. The cholangiogram was then obtained. There was good  flow proximally and distally in the biliary tree. However, contrast did not  flow easily through the duodenum. There appeared to be two small filling  defects at the ampulla. There was also a small filling defect more proximal  to the biliary tree above the cystic duct, common duct junction. A dose of  glucagon was given and after several minutes we repeated the cholangiogram.  The contrast flowed easily into the duodenum but the filling defects  remained. Therefore a decision was made to securely ligate the cystic duct   and consult gastroenterology for a possible ERCP postoperatively. The  cholangiogram catheter was removed and four clips were placed on the cystic  duct which was then divided. The cystic artery was also ligated with clips  and divided. Hook cautery was then used to remove the gallbladder from the  liver bed. The gallbladder was removed through the umbilical port.  Hemostasis was obtained with Bovie cautery in the liver bed. This area was  thoroughly irrigated and suctioned dry. Ports were removed under direct  vision, no bleeding was noted. The pneumoperitoneum was released. The  pursestring suture was used to close the fascia at the umbilical port. All  sponge, instrument and needle counts were correct. The patient was extubated  and brought to the recovery room in stable condition.   At the beginning of the laparoscopic portion of the case, the patient had  some bradycardia which was treated by releasing the pneumoperitoneum briefly  and treated medically by the anesthetist. This resolved quickly, no problems  for the remainder of the case. For details, please see the anesthesia  records.      Wilmon Arms. Tsuei, M.D.  Electronically Signed     MKT/MEDQ  D:  03/21/2005  T:  03/21/2005  Job:  [id number]

## 2010-09-22 NOTE — Op Note (Signed)
[facility]  Patient:    [name], [name] Visit Number: [id number] MRN: [id number]          Service Type: DSU Location: [facility] Attending Physician:  Cornell Barman Dictated by:   Lenard Galloway Chaney Malling, M.D. Proc. Date: 07/17/01 Admit Date:  07/17/2001                             Operative Report  PREOPERATIVE DIAGNOSIS: 1. Fracture left proximal humerus. 2. Adhesive capsulitis, left shoulder secondary to fracture.  POSTOPERATIVE DIAGNOSIS:  OPERATION PERFORMED:  SURGEON:  Rodney A. Chaney Malling, M.D.  ANESTHESIA:  General.  INDICATIONS FOR PROCEDURE:  The patient is an 77 year old female who had a previous fracture of the left proximal humerus.  The patient developed adhesive capsulitis about the left shoulder.  She is now admitted for closed manipulation of the left shoulder under anesthesia.  ____________  DESCRIPTION OF PROCEDURE:  The patient was placed on the operating table in supine position.  After satisfactory ____________ anesthesia, an attempt was made at manipulation.  The shoulder was abducted and externally rotated. There was a great deal of resistance.  It was felt that the adhesions were extremely dense and that more forceful manipulation could possibly fracture the proximal humerus and manipulation was terminated.  The patient then returned to the recovery room in excellent condition. Technically, this went well. Dictated by:   Lenard Galloway Chaney Malling, M.D. Attending Physician:  Cornell Barman DD:  07/17/01 TD:  07/18/01 Job: [id number]

## 2010-09-22 NOTE — H&P (Signed)
NAMEKADASIA, [name]                 ACCOUNT NO.:  [id number]   MEDICAL RECORD NO.:  [id number]          PATIENT TYPE:  INP   LOCATION:  1604                         FACILITY:  [facility]   PHYSICIAN:  Wilmon Arms. Corliss Skains, M.D. DATE OF BIRTH:  [birth year]   DATE OF ADMISSION:  03/21/2005  DATE OF DISCHARGE:  03/25/2005                                HISTORY & PHYSICAL   CHIEF COMPLAINT:  Gallstone pancreatitis.   HISTORY OF PRESENT ILLNESS:  The patient is a pleasant 77 year old female  who was recently hospitalized with pancreatitis.  She had elevated liver  enzymes as well as high amylase and lipase.  Workup included an ultrasound  which showed evidence of cholelithiasis but no evidence of common bile duct  dilatation.  The patient was hospitalized for approximately a week.  Since  discharge she has done reasonably well.  In retrospect, she states that over  the last year she has had several episodes of epigastric and right upper  quadrant pain associated with some vomiting.  She has been doing well for  the last several weeks but has been avoiding any fat in her food.   PAST MEDICAL HISTORY:   MEDICATIONS:  1.  Effexor 75 mg b.i.d.  2.  Lorazepam 1 mg p.r.n.  3.  Prevacid 30 mg every day.  4.  Actonel 35 mg every week.  5.  Os-Cal two tabs every day.  6.  Aspirin.   ALLERGIES:  HYDROCODONE makes her feel weak.   PAST SURGICAL HISTORY:  1.  Appendectomy.  2.  Hysterectomy.   PAST MEDICAL HISTORY:  1.  Hypercholesterolemia.  2.  Gastroesophageal reflux.  3.  Depression.  4.  Osteoporosis.  5.  Osteoarthritis.  6.  Poor peripheral circulation.  7.  Pancreatitis.   SOCIAL HISTORY:  Nonsmoker, nondrinker.   FAMILY HISTORY:  Father and mother are both deceased from pulmonary disease.  Brother is deceased from throat cancer.   REVIEW OF SYSTEMS:  Please see the attached review of systems sheet.   PHYSICAL EXAMINATION:  VITAL SIGNS:  Height 5 feet 8 inches, weight 141.4,  blood pressure 153/73, pulse 86, temp 97.4.  GENERAL:  This is a well developed, well nourished female in no apparent  distress.  HEENT:  EOMI.  Her sclerae are anicteric.  LUNGS:  Clear to auscultation bilaterally.  Normal respiratory effort.  HEART:  Regular rate and rhythm.  No murmurs.  ABDOMEN:  Soft and nontender, nondistended.  Well-healed lower midline  incision.  Good bowel sounds.  SKIN:  Warm, dry with no sign of jaundice.   LABORATORY:  Blood work showed normal electrolytes and normal liver  functions.  Her white count was also normal.   IMPRESSION:  Cholelithiasis with recent gallstone pancreatitis.   PLAN:  Recommend laparoscopic cholecystectomy with intraoperative  cholangiogram.  I discussed the procedure with the patient including all the  potential risks and benefits and expected outcomes.  I answered all of her  questions and the patient wishes to proceed.  She has received medical  clearance prior to surgery.  Wilmon Arms. Tsuei, M.D.  Electronically Signed     MKT/MEDQ  D:  05/30/2005  T:  05/30/2005  Job:  [id number]

## 2010-09-22 NOTE — Procedures (Signed)
[facility]  Patient:    [name], [name]                        MRN: [id number] Proc. Date: 09/09/01 Adm. Date:  [id number] Attending:  Sabino Gasser                           Procedure Report  PROCEDURE:  Upper endoscopy with dilation.  INDICATIONS:  Dysphagia.  ANESTHESIA:  Demerol 50 mg, Versed 5 mg, ampicillin 2 grams.  DESCRIPTION OF PROCEDURE:  With the patient mildly sedated in the left lateral decubitus position, in room #3 of Radiology, the Olympus videoscopic endoscope was inserted into the mouth and passed under direct vision through the esophagus.  The distal esophagus was approached and showed changed of esophagitis, moderately severe.  This was photographed first.  Then we entered into the stomach.  Fundus, body, antrum, duodenal bulb, and second portion of the duodenum were visualized and appeared normal.  The endoscope was pulled back into the stomach and placed in retroflexion to view the stomach from below, and a hiatal hernia was seen.  The endoscope was then straightened, a guidewire was passed.  The endoscope was withdrawn, taking circumferential views of the entire gastric and subsequently esophageal mucosa which otherwise appeared normal.  Subsequently dilators 16 and 18 were passed over the guidewire with minimal resistance.  The guidewire was removed, and this was done under fluoroscopic control.  The endoscope was inserted, and the previous area of esophagitis was seen and at this point was biopsied.  Once accomplished the endoscope was withdrawn.  The patients vital signs and pulse oximeter remained stable.  The patient tolerated the procedure well without apparent complications.  FINDINGS:  Changes of esophagitis of distal esophagus, dilated and biopsied. Await biopsy report.  Will have patient on liquid diet today and proceed to a regular diet as tolerated.  Will need to be on proton pump inhibitor therapy. DD:  09/09/00 TD:   09/07/00 Job: [id number]

## 2010-09-22 NOTE — Discharge Summary (Signed)
[name], [name]                 ACCOUNT NO.:  [id number]   MEDICAL RECORD NO.:  [id number]          PATIENT TYPE:  INP   LOCATION:  5729                         FACILITY:  [facility]   PHYSICIAN:  Jackie Plum, M.D.DATE OF BIRTH:  [birth year]   DATE OF ADMISSION:  12/17/2004  DATE OF DISCHARGE:  12/22/2004                                 DISCHARGE SUMMARY   DISCHARGE DIAGNOSES:  1.  Acute pancreatitis, resolved.  2.  Abdominal pain.   SECONDARY DIAGNOSIS:  1.  Nausea and vomiting, resolved.  2.  Elevated liver function testing likely secondary to pancreatitis,      improved, outpatient follow-up recommended.  3.  History of depression.  4.  Dyslipidemia.  5.  Peripheral vascular disease.   DISCHARGE MEDICATIONS:  1.  Protonix 40 mg daily.  2.  Pravachol 80 mg daily.  3.  Her Zocor has been discontinued.  4.  She is going to continue her pentoxifylline, Effexor, lorazepam, Actonel      and aspirin as previously.   CONSULTATIONS:  Not applicable.   PROCEDURES:  The patient had a HIDA scan, which was negative.  She also had  an abdominal ultrasound on admission, which showed scattered, floating tiny  intraluminal stones.  There was no ductal obstruction.   DISCHARGE LABORATORY DATA:  WBC count 5.8, hemoglobin 12.5, hematocrit 36.3,  MCV 88.0, platelet count 228.  Sodium 139, potassium 4.0, chloride 107, CO2  21, glucose 92, BUN 3, creatinine 0.7.  Alkaline phosphatase was 100.  AST  153, ALT 220 (the patient's admitting alkaline phosphatase was 175 with AST  on admission of 895 and ALT on admission of 456).  Lipase 61 (the patient's  lipase on admission was more than 2000).   REASON FOR ADMISSION:  Abdominal pain, nausea and vomiting.  The patient  came to the ED with above complaints.  Apparently she had been taking  Pravachol but due to insurance issues, she was changed to Zocor and has had  some GI symptoms which are similar soon after starting Zocor.  On admission,  the patient was noted to be hypertensive with a BP of 158 systolic.  Her  limited exam revealed guarding with tenderness.  Her laboratory results were  notable for a lipase of more than 2000.  She was admitted for further  management of presumed acute pancreatitis.   HOSPITAL COURSE:  The patient was started on a regimen of bowel rest.  She  had a leukocytosis of 16,100 and therefore an x-ray of the chest was done,  which did not reveal any pneumonic process.  This was presumed secondary to  stress margination.  She had a significantly elevated AST and there was  concern for possible stone, and above x-ray and above ultrasound which was  followed up with HIDA scan was done.  HIDA scan was negative for any  cholecystitis.  With a regimen of bowel rest and supportive care, including  IV fluid supplementation, the patient continued to do well and by the day of  discharge, she is able to tolerate a full diet without any problems.  At  this time we believe that the possibilities would include a possible  gallstone pancreatitis with the stone having passed at the time of admission  or around the time of admission, but we cannot rule out possibly involvement  of the Zocor.  We have therefore opted to change her dose of Zocor to  Pravachol.  She is going to be on Protonix and continue on the other  medications as previously.  She would need careful follow-up of her liver  function testing, which had been significantly improved, and I expect them  to improve further to normalization, since they are most likely related to  her acute pancreatitis, which has since resolved.  She will be discharged  home in stable, satisfactory condition.  She is going to follow up with her  PCP in about 10-14 days for follow-up.  At that time, LFTs will need to be  checked including her BMET as well as her CBC.   On rounds today, the patient feels fine.  She denies feeling any fever,  shortness of breath, nausea or  vomiting.  She has been able to eat over the  last 24 hours without any problems.  Cardiopulmonary exam is unremarkable.  Abdomen is soft, nontender, normal bowel sounds.  Abdomen is soft, bowel  sounds present, no tenderness.   Discharge blood pressure was 130/73, pulse 78, respirations 20, temperature  99.4 degrees Fahrenheit.  She had a pulse oximetry of 98% on room air.      Jackie Plum, M.D.  Electronically Signed     GO/MEDQ  D:  12/22/2004  T:  12/22/2004  Job:  [id number]   cc:   Dellis Anes. Idell Pickles, M.D.  [address]  [place]  [place] [address]  Fax: [phone]

## 2010-09-22 NOTE — Op Note (Signed)
NAMEJOHNASIA, LIESE                 ACCOUNT NO.:  [id number]   MEDICAL RECORD NO.:  [id number]          PATIENT TYPE:  INP   LOCATION:  1604                         FACILITY:  [facility]   PHYSICIAN:  Graylin Shiver, M.D.   DATE OF BIRTH:  [birth year]   DATE OF PROCEDURE:  03/22/2005  DATE OF DISCHARGE:                                 OPERATIVE REPORT   PROCEDURE:  Endoscopic retrograde cholangiogram and sphincterotomy.   INDICATIONS FOR PROCEDURE:  Suspected choledocholithiasis on intraoperative  angiogram.   Informed consent was obtained after explanation of the risks of bleeding,  infection, perforation and pancreatitis.   MEDICATIONS GIVEN DURING PROCEDURE:  Fentanyl 125 mcg IV, Versed 10 mg IV,  glucagon 1 mg IV.   DESCRIPTION OF PROCEDURE:  With the patient lying on the fluoroscopy table  on her abdomen, the Olympus lateral viewing duodenum scope was inserted into  the oropharynx and passed into the esophagus.  It was advanced down the  esophagus, then into the stomach and into the duodenum. The stomach and  duodenum did not reveal any specific lesions.  The region of the papilla of  Vater was located in the second portion of the duodenum. The papillary  orifice was very very small and somewhat flat.  Cannulation was attempted  using a guidewire and palpable at multiple attempts at cannulation were  attempted, and it was very difficult to cannulate because the papilla and  the orifice were so small.  After just a couple of attempts at cannulating  with the guidewire and papillotome, the papilla was actually very difficult  to see, but I could occasionally see some bile coming from this area.  I  switched to a smaller palpable at an smaller diameter guidewire and  repositioned the patient more towards her left lateral side.  Orientation  was actually quite good, but the papillary orifice was again very, very  small.  One cannulation of the guidewire did trace  into the pancreatic  duct. This was then removed.  Multiple other attempts at cannulation of the  common bile duct were then done, and finally I was able to get selective  cannulation of the common bile duct.  After this was achieved with a  guidewire and palpable, contrast was injected into the biliary tree.  I  initially saw what appeared to be a filling defect in the distal bile duct  but then did not see a again this was seen on initial injection.  Because of  the suspected choledocholithiasis, and because this patient has had  gallstone pancreatitis in the past, I felt that sphincterotomy would be  indicated.  I therefore performed a generous sphincterotomy after proper  localization of the sphincterotome.  Good bile flow was achieved.  The  papillotome was then removed.  An 8.5 mm endoscopic balloon was advanced out  over the guidewire and advanced up the biliary tree.  The duct was swept.  The 8.5-mm balloon was easily swept out of the biliary tree and to the  papillary side after the sphincterotomy.  There was no bleeding.  The  final  occlusion cholangiogram did not reveal any stones.  I did not see an obvious  stone pass out of the bile duct, however, one could have passed.  The  _______ defect could have been broken up if it was muddy in appearance.  She  tolerated the procedure well without obvious complications.  She was  returned to the recovery room.   IMPRESSION:  Suspected choledocholithiasis causing prior episode of  gallstone pancreatitis.  The final occlusion cholangiogram appears to be  clear.           ______________________________  Graylin Shiver, M.D.     SFG/MEDQ  D:  03/22/2005  T:  03/23/2005  Job:  [id number]   cc:   Dellis Anes. Idell Pickles, M.D.  Fax: [phone]   Wilmon Arms. Corliss Skains, M.D.  [address] [place] [address] [id number]  [place]

## 2010-09-22 NOTE — Consult Note (Signed)
[name], [name]                 ACCOUNT NO.:  [id number]   MEDICAL RECORD NO.:  [id number]          PATIENT TYPE:  INP   LOCATION:  1604                         FACILITY:  [facility]   PHYSICIAN:  Graylin Shiver, M.D.   DATE OF BIRTH:  [birth year]   DATE OF CONSULTATION:  03/22/2005  DATE OF DISCHARGE:                                   CONSULTATION   REASON FOR CONSULTATION:  The patient is an 77 year old female who is status  post laparoscopic cholecystectomy on March 21, 2005.  At that time, she  was found to have gallstones.  An operative cholangiogram was done, which  raised the suspicion of a distal common bile duct stone.  We were consulted  for ERCP.   I briefly saw the patient yesterday while she was in the PACU  postoperatively, but since she was just coming out of anesthesia, even  though I did talk to her, I did not feel that she was appropriate at that  time to really give informed consent for ERCP.  I therefore came back today.  I reviewed the findings with her.  She is feeling okay today  postoperatively.   ALLERGIES:  HYDROCODONE.   SOCIAL HISTORY:  She does not smoke or drink alcohol.   MEDICATIONS:  Noted on chart.   Prior surgeries include hysterectomy, appendectomy.   REVIEW OF SYSTEMS:  Currently no complaints.  No chest pain, shortness of  breath, cough or sputum production.   PHYSICAL EXAMINATION:  GENERAL: She is in no distress.  HEENT:  Nonicteric.  VITAL SIGNS:  Stable.  HEART:  Regular rhythm.  LUNGS:  Clear.  ABDOMEN:  There is some minimal discomfort from her recent surgery yesterday  but no significant pain.   Operative cholangiogram was reviewed, showing the likelihood of a distal  common duct stone.   IMPRESSION:  1.  Status post laparoscopic cholecystectomy.  2.  Probable choledocholithiasis.   PLAN:  Proceed with ERCP with sphincterotomy and stone extraction.  The  procedure was explained in detail with the patient and her husband,  along  with the potential risks of bleeding, infection, perforation, and  pancreatitis.  She understands.  The husband understands.  She has decided  to undergo the procedure.           ______________________________  Graylin Shiver, M.D.     SFG/MEDQ  D:  03/22/2005  T:  03/22/2005  Job:  [id number]   cc:   Wilmon Arms. Corliss Skains, M.D.  [address] [place] [phone]  [place]. Idell Pickles, M.D.  Fax: [phone]

## 2010-09-22 NOTE — H&P (Signed)
[name], [name]                 ACCOUNT NO.:  [id number]   MEDICAL RECORD NO.:  [id number]          PATIENT TYPE:  EMS   LOCATION:  MAJO                         FACILITY:  [facility]   PHYSICIAN:  Deirdre Peer. Polite, M.D. DATE OF BIRTH:  Aug 13, 1918   DATE OF ADMISSION:  12/17/2004  DATE OF DISCHARGE:                                HISTORY & PHYSICAL   CHIEF COMPLAINT:  Nausea, vomiting, abdominal pain.   HISTORY OF PRESENT ILLNESS:  An 77 year old female with a known history of  high cholesterol and depression, who presents to the ED with complaints of  the acute onset of nausea, vomiting, and abdominal pain.  According to the  patient, she had been in her usual state of health until today.  She had  some nausea and abdominal discomfort.  The patient stated it was in the  periumbilical area.  The patient states she later went out to eat at a  restaurant, __________, and ate broiled fish.  After eating the seafood, the  patient persisted in having nausea and abdominal discomfort, and  subsequently had emesis.  The patient states she vomited greater than 10  times, and denied any blood.  Because of the persistent nature of these  symptoms, the patient presented to the ED for evaluation.  Of note, the  patient stated that she did have abdominal discomfort approximately 1 month  ago that resolved with anti-emetics; however, today the symptoms did not  resolve, and again presented to the ED for evaluation.  In the ED, the  patient was evaluated, and had labs that showed significant leukocytosis.  Lipase was 2000.  Abdominal ultrasound showed intraluminal gallstones  without gallbladder wall thickening.  No pericolic fluid.  No evidence of  biliary dilatation.  The [facility] hospitalist was called for further evaluation  and treatment.  At the time of my evaluation, the patient was alert and  oriented x3, still with significant abdominal discomfort.  Denies any fever  or chills.  No diarrhea, no  constipation, no dysuria.  Denies any chest pain  or shortness of breath, just abdominal pain as described above.  Admission  is deemed necessary for further evaluation and treatment of abdominal pain  and abnormal laboratories, consistent with pancreatitis probably from a  gallstone etiology.   PAST MEDICAL HISTORY:  As stated above.   MEDICATIONS ON ADMISSION:  1.  Effexor 75 mg daily.  2.  Lorazepam 1 mg p.r.n.  3.  Prevacid 30 mg daily.  4.  Pentoxifylline 400 mg daily.  5.  Aspirin 81 mg daily.  6.  Zocor daily.   SOCIAL HISTORY:  Negative for tobacco, alcohol, or drugs.   ALLERGIES:  Reports allergy to HYDROCODONE which causes nausea.   SOCIAL HISTORY:  Negative tobacco, alcohol, or drugs.   PAST SURGICAL HISTORY:  1.  Admits to having a hysterectomy in the past in 1966, according to the      patient secondary to cervical CA.  The patient states no recurrence      since then.  2.  The patient also admits to appendectomy in  the past.   FAMILY HISTORY:  Mother deceased as age 22 secondary to pneumonia.  Father  with a history of asthma and emphysema.   REVIEW OF SYSTEMS:  As stated in the HPI.  Otherwise, negative.   PHYSICAL EXAMINATION:  GENERAL:  The patient is alert and oriented x3 and in  moderate distress secondary to abdominal discomfort.  VITAL SIGNS:  Temperature 98.6, blood pressure 158/70, pulse 95, respiratory  rate 19, saturating 99%.  HEENT:  Within normal limits.  CHEST:  Without rales or rhonchi.  CARDIOVASCULAR:  Regular.  No S3.  ABDOMEN:  Positive discomfort to palpation in the periumbilical area.  No  mass.  No rebound tenderness.  Positive guarding.  EXTREMITIES:  No edema.  The patient does have multiple broken vessels in  the lower extremity, consistent with possible varicose veins.  NEUROLOGIC:  Nonfocal.   LABORATORY DATA:  Negative UA, within normal limits.  CBC revealed white  count of 16.4, hemoglobin 14.6, MCV 87.6, neutrophil count 93.   Lipase 2000.  BMET revealed sodium of 140, potassium 3.3, chloride 105, BUN 15, glucose  131, creatinine 1.0.  Point of care enzymes within normal limits.  LFT's  revealed AST of 895, ALT of 466, bilirubin 2.2, protein 6.8.  __________ of  the chest shows chronic interstitial coarsening with a focal density at the  left heart border.  A 2-D x-ray is recommended.  Abdominal ultrasound  revealed tiny intraluminal gallstones without gallbladder wall thickening or  pericholecystic fluid.  No evidence of biliary dilatation.   ASSESSMENT:  1.  Nausea/vomiting, abdominal pain.  2.  Elevated liver function tests.  3.  Elevated bilirubin.  4.  Elevated lipase.  Thus, symptoms are consistent with pancreatitis.  Must      rule out past gallstones as the etiology.  5.  Leukocytosis.  6.  Depression.  7.  Peripheral vascular disease.  8.  High cholesterol.  9.  Hypokalemia.   RECOMMENDATIONS:  Recommend the patient be admitted to a medicine floor bed.  The patient will be started on IV fluids, anti-emetics, and empiric  antibiotics.  The patient will be placed on a clear liquid diet, NPO if  emesis.  Will obtain follow up lipase.  Will obtain a [facility] HIDA scan.  Consider a CT of the abdomen and pelvis to rule out  any other pathology.  As the patient has elevated LFT's, this could be  consistent with gallstone pancreatitis or secondary to the patient's Zocor  use.  Will consider a hepatitis panel if the patient has persistent  elevation in her LFT's.  Will make further recommendations after review of  the above studies.      Deirdre Peer. Polite, M.D.  Electronically Signed     RDP/MEDQ  D:  12/18/2004  T:  12/18/2004  Job:  [id number]   cc:   Dellis Anes. Idell Pickles, M.D.  [address]  [place]  [place] [address]  Fax: [phone]

## 2010-09-22 NOTE — Consult Note (Signed)
[name], [name]                 ACCOUNT NO.:  [id number]   MEDICAL RECORD NO.:  [id number]          PATIENT TYPE:  INP   LOCATION:  1604                         FACILITY:  [facility]   PHYSICIAN:  Melissa L. Ladona Ridgel, MD  DATE OF BIRTH:  11-09-1918   DATE OF CONSULTATION:  03/24/2005  DATE OF DISCHARGE:                                   CONSULTATION   REQUESTING PHYSICIAN:  Dr. Manus Rudd   REASON FOR CONSULTATION:  Dizziness.   PRIMARY CARE PHYSICIAN:  Dr. Foye Deer   HISTORY OF PRESENT ILLNESS:  The patient is an 77 year old white female with  a past medical history significant for dyslipidemia, depression, gallstone  pancreatitis as well as peripheral vascular disease.  The patient was  admitted by the surgical team for a laparoscopic cholecystectomy and ERCP  secondary to retained common bile duct stone.  The patient tolerated the OR  well but on the day of discharge, complained of some dizziness.  The patient  states she had an okay night, got up to go the bathroom, and had to hold on  to everything in the path between the bed and the bathroom because of severe  dizziness.  She states she got back in the bed, rested, and the symptoms  resolved.  She subsequently was up and out of the bed on numerous occasions  with no further symptoms.   PAST MEDICAL HISTORY:  1.  Dyslipidemia.  2.  Depression.  3.  Pancreatitis secondary to gallstones.  4.  Peripheral vascular disease.  5.  GERD.  6.  Osteoporosis   REVIEW OF SYSTEMS:  She denies fever, chills, nausea, or vomiting.  She does  still have a sore throat.  She does have some constipation.  She does have  some mild abdominal pain related to her incisions.  She denies any dysuria,  and she does have a previous history of dizziness, but it sounds like it was  in the context of an illness.   SOCIAL HISTORY:  She denies smoking or ethanol.   FAMILY HISTORY:  Mom and dad are both deceased.  Mom died from pneumonia at  a  very young age, and dad had asthma and emphysema.   ALLERGIES:  HYDROCODONE.   MEDICATIONS IN THE HOSPITAL:  1.  Atenolol 25 mg daily.  2.  Os-Cal 1 tab p.o. twice daily.  3.  Protonix 40 mg daily.  4.  Effexor 75 mg q.h.s.  5.  Tylenol p.r.n.  6.  Motrin p.r.n.  7.  Morphine 2 mg IV q.2h. p.r.n.  8.  She really has not been using the Darvocet 1-2 q.4h. p.r.n.   PHYSICAL EXAMINATION:  VITAL SIGNS:  Temperature is 98.6, blood pressure  135/60, pulse 67, respirations 14, saturations 98% as documented on 2 L, but  the patient is on room air.  GENERAL:  She is in no acute distress.  She is normocephalic, atraumatic.  Pupils equal, round, and reactive to light.  Extraocular muscles are intact.  Mucous membranes are moist.  She has no bruits.  She has mild posterior  pharyngeal  erythema.  NECK:  Supple.  CHEST:  Crackles at the right base with no wheezing or dullness.  CARDIOVASCULAR:  Regular rate and rhythm, positive S1 and S2.  No S3 or S4.  No murmurs, rubs, or gallops are noted.  ABDOMEN:  Soft, minimally tender over her intrauterine incision sites, but  no guarding or rebound are present.  EXTREMITIES:  No cyanosis, clubbing, or edema.  NEUROLOGIC:  She has intact finger-to-nose testing.  Power is 5/5.  DTRs are  2+.  She has no nystagmus, and Romberg is negative, and she has a relatively  stable gait for her age.   LABORATORY DATA:  White count is 7.4, hemoglobin 12.9, hematocrit 38.5,  platelets 209.  Her sodium is 137, potassium 3.4, chloride 101, CO2 is 30,  BUN 8, creatinine 0.8.  Glucose is 120.  Her LFTs:  AST is 59, ALT is 46.   ASSESSMENT/PLAN:  This is an 77 year old white female status post  laparoscopic cholecystectomy with ERCP.  She was ready for discharge when  she complained of some dizziness, possibly secondary to rapid change in  position.  The symptoms resolved with rest, and she has had no further  symptoms, even though she has been up and around the  room.  1.  Cardiovascular:  We will check an EKG.  There is no need for cardiac      enzymes at this point.  We will also check orthostatic blood pressures.  2.  Pulmonary:  She has crackles at the right base, so we will check a chest      x-ray to rule out occult pneumonia.  If atelectasis is present, then we      will recommend incentive spirometry.  If her saturations continue to run      low, I would consider a PE work-up but at this time, it is very low on      my list.  3.  Gastrointestinal:  She has a history of gastroesophageal reflux disease,      continue her Protonix.  4.  Genitourinary:  We will check a UAC and ask to rule out occult urinary      tract infection.  5.  Endocrine:  I doubt this represents hypoglycemia, as she has had no      related symptoms, diaphoresis, or tremulousness.  6.  Neurologic:  She appears to be neurologically intact with no focal      deficits; however, if she continues to have dizziness, I would check a      CT of her head to rule out stroke.  The patient, however, does not have      significant risk factors for this other than age and dyslipidemia.  We      will follow with you.      Melissa L. Ladona Ridgel, MD  Electronically Signed     MLT/MEDQ  D:  03/24/2005  T:  03/24/2005  Job:  [id number]   cc:   Dellis Anes. Idell Pickles, M.D.  Fax: [phone]

## 2010-09-22 NOTE — Discharge Summary (Signed)
[name], [name]                 ACCOUNT NO.:  [id number]   MEDICAL RECORD NO.:  [id number]          PATIENT TYPE:  INP   LOCATION:  1604                         FACILITY:  [facility]   PHYSICIAN:  Wilmon Arms. Corliss Skains, M.D. DATE OF BIRTH:  02/03/1919   DATE OF ADMISSION:  03/21/2005  DATE OF DISCHARGE:  03/25/2005                                 DISCHARGE SUMMARY   ADMISSION DIAGNOSES:  Symptomatic cholelithiasis and recent gallstone  pancreatitis.   DISCHARGE DIAGNOSES:  Symptomatic cholelithiasis and recent gallstone  pancreatitis.   BRIEF HISTORY:  The patient is an 77 year old female who was recently  hospitalized for pancreatitis. Workup revealed evidence of cholelithiasis.  Her symptoms resolved and the patient was referred for surgical evaluation.  We recommended a laparoscopic cholecystectomy. She was admitted to the  hospital on March 21, 2005 where she underwent an uneventful  cholecystectomy. However, during her intraoperative cholangiogram she was  noted to have some filling defects in the common bile duct. There was no  clear evidence of obstruction as contrast flowed easily into the duodenum.  The patient was admitted to the hospital postoperatively and was seen in  consultation by gastroenterology. She underwent an ERCP on March 22, 2005. She underwent a sphincterotomy. No retained stones were noted. On  postoperative day #3, the patient felt well from an abdominal standpoint but  she did have some dizziness. Her vital signs were normal. Blood work was  also normal. Due to her complaints we consulted with Dr. Ladona Ridgel of the  hospitalist group. Her symptoms quickly resolved and no clear etiology was  obtained. This was felt to be possibly due to some orthostatic hypotension.  On the date of discharge, the patient is feeling great. She states that she  feels like a brand new person and is ready to go home. She is tolerating a  regular diet, voiding well. She is taking  p.r.n. pain medications with good  relief of her pain. Her wounds are healing well with no sign of infection.   DISCHARGE INSTRUCTIONS:  Darvocet-N 100 1-2 p.o. q.4-6 p.r.n. The patient  may shower. She should followup with Dr. Corliss Skains in 2-3 weeks.      Wilmon Arms. Tsuei, M.D.  Electronically Signed     MKT/MEDQ  D:  03/25/2005  T:  03/26/2005  Job:  [id number]   cc:   Melissa L. Ladona Ridgel, MD   Dellis Anes Idell Pickles, M.D.  Fax: [phone]

## 2011-02-01 LAB — POCT HEMOGLOBIN-HEMACUE: Hemoglobin: 11.2 — ABNORMAL LOW

## 2011-02-01 LAB — POCT I-STAT, CHEM 8
BUN: 23
Creatinine, Ser: 0.9
Glucose, Bld: 113 — ABNORMAL HIGH
Sodium: 139
TCO2: 29

## 2011-10-06 ENCOUNTER — Emergency Department: Payer: Self-pay | Admitting: Emergency Medicine

## 2011-10-07 LAB — COMPREHENSIVE METABOLIC PANEL
Albumin: 3.8 g/dL (ref 3.4–5.0)
Anion Gap: 10 (ref 7–16)
Bilirubin,Total: 0.3 mg/dL (ref 0.2–1.0)
Calcium, Total: 9.1 mg/dL (ref 8.5–10.1)
Chloride: 103 mmol/L (ref 98–107)
Co2: 30 mmol/L (ref 21–32)
Creatinine: 0.76 mg/dL (ref 0.60–1.30)
EGFR (African American): >60
Glucose: 102 mg/dL — ABNORMAL HIGH (ref 65–99)
Osmolality: 288 (ref 275–301)
Potassium: 4 mmol/L (ref 3.5–5.1)
SGOT(AST): 22 U/L (ref 15–37)
SGPT (ALT): 17 U/L
Sodium: 143 mmol/L (ref 136–145)

## 2011-10-07 LAB — CBC
MCH: 30.7 pg (ref 26.0–34.0)
MCHC: 34 g/dL (ref 32.0–36.0)
MCV: 90 fL (ref 80–100)
Platelet: 188 10*3/uL (ref 150–440)
RBC: 4.66 10*6/uL (ref 3.80–5.20)
RDW: 12.9 % (ref 11.5–14.5)

## 2011-10-07 LAB — TSH: Thyroid Stimulating Horm: 2.64 u[IU]/mL

## 2011-10-07 LAB — PROTIME-INR: INR: 1

## 2011-10-07 LAB — CK TOTAL AND CKMB (NOT AT ARMC): CK, Total: 72 U/L (ref 21–215)

## 2011-10-07 LAB — TROPONIN I: Troponin-I: <0.02 ng/mL

## 2011-10-07 LAB — LIPASE, BLOOD: Lipase: 106 U/L (ref 73–393)

## 2014-07-19 DIAGNOSIS — M810 Age-related osteoporosis without current pathological fracture: Secondary | ICD-10-CM | POA: Diagnosis not present

## 2014-07-19 DIAGNOSIS — G301 Alzheimer's disease with late onset: Secondary | ICD-10-CM | POA: Diagnosis not present

## 2014-07-19 DIAGNOSIS — N183 Chronic kidney disease, stage 3 (moderate): Secondary | ICD-10-CM | POA: Diagnosis not present

## 2014-07-19 DIAGNOSIS — M545 Low back pain: Secondary | ICD-10-CM | POA: Diagnosis not present

## 2014-07-19 DIAGNOSIS — E785 Hyperlipidemia, unspecified: Secondary | ICD-10-CM | POA: Diagnosis not present

## 2014-07-19 DIAGNOSIS — E039 Hypothyroidism, unspecified: Secondary | ICD-10-CM | POA: Diagnosis not present

## 2014-07-19 DIAGNOSIS — I75029 Atheroembolism of unspecified lower extremity: Secondary | ICD-10-CM | POA: Diagnosis not present

## 2014-07-19 DIAGNOSIS — I10 Essential (primary) hypertension: Secondary | ICD-10-CM | POA: Diagnosis not present

## 2015-01-24 DIAGNOSIS — G301 Alzheimer's disease with late onset: Secondary | ICD-10-CM | POA: Diagnosis not present

## 2015-01-24 DIAGNOSIS — E785 Hyperlipidemia, unspecified: Secondary | ICD-10-CM | POA: Diagnosis not present

## 2015-01-24 DIAGNOSIS — I10 Essential (primary) hypertension: Secondary | ICD-10-CM | POA: Diagnosis not present

## 2015-01-24 DIAGNOSIS — H6123 Impacted cerumen, bilateral: Secondary | ICD-10-CM | POA: Diagnosis not present

## 2015-01-24 DIAGNOSIS — K219 Gastro-esophageal reflux disease without esophagitis: Secondary | ICD-10-CM | POA: Diagnosis not present

## 2015-02-24 ENCOUNTER — Ambulatory Visit (INDEPENDENT_AMBULATORY_CARE_PROVIDER_SITE_OTHER): Payer: Commercial Managed Care - HMO | Admitting: Podiatry

## 2015-02-24 ENCOUNTER — Encounter: Payer: Self-pay | Admitting: Podiatry

## 2015-02-24 VITALS — BP 145/54 | HR 62 | Resp 12

## 2015-02-24 DIAGNOSIS — B351 Tinea unguium: Secondary | ICD-10-CM

## 2015-02-24 DIAGNOSIS — I739 Peripheral vascular disease, unspecified: Secondary | ICD-10-CM

## 2015-02-24 DIAGNOSIS — M79676 Pain in unspecified toe(s): Secondary | ICD-10-CM | POA: Diagnosis not present

## 2015-02-24 NOTE — Progress Notes (Signed)
   Subjective:    Patient ID: [name], female    DOB: 08/10/1918, 81 y.o.   MRN: [id number]  HPI Patient presents the office with a family member for concerns of thick, painful, elongated toenails for which she is unable to trim herself. She states the nails cause pain particularly with pressure in certain shoes. Denies any redness or drainage on the toenails. No other complaints at this time.  Review of Systems  Endocrine: Positive for cold intolerance.  Genitourinary: Positive for frequency.  Musculoskeletal: Positive for back pain, joint swelling and gait problem.  Skin: Positive for color change.  Neurological: Positive for weakness.       Objective:   Physical Exam AAO 3, NAD DP/PT pulses 1/4, CRT less than 3 seconds Protective sensation  Mildly decreased with Simms Weinstein monofilament , Achilles reflex intact Nails are hypertrophic, dystrophic, brittle, discolored, elongated 10. There is tenderness palpation over on the toenails 1-5 bilaterally. There is no surrounding erythema or drainage on the toenails. There are small hyperkeratotic lesions of the distal aspect of the second third digits on the left foot. Upon debridement there was no underlying ulceration, drainage or other signs of infection. No other areas of tenderness to bilateral lower extremities. Hammertoe contractures are present. No pain with calf compression, swelling, warmth, erythema.     Assessment & Plan:   81 year old female with symptomatic onychomycosis -Treatment options discussed including all alternatives, risks, and complications -Nails sharply debrided 10 without complication/bleeding -Discussed the importance of daily foot inspection -Follow-up in 3 months or sooner if any problems arise. In the meantime, encouraged to call the office with any questions, concerns, change in symptoms.   Celesta Gentile, DPM

## 2015-03-02 DIAGNOSIS — Z23 Encounter for immunization: Secondary | ICD-10-CM | POA: Diagnosis not present

## 2015-06-02 ENCOUNTER — Ambulatory Visit: Payer: Commercial Managed Care - HMO | Admitting: Podiatry

## 2015-06-09 ENCOUNTER — Ambulatory Visit (INDEPENDENT_AMBULATORY_CARE_PROVIDER_SITE_OTHER): Payer: Commercial Managed Care - HMO | Admitting: Podiatry

## 2015-06-09 ENCOUNTER — Encounter: Payer: Self-pay | Admitting: Podiatry

## 2015-06-09 DIAGNOSIS — M79676 Pain in unspecified toe(s): Secondary | ICD-10-CM

## 2015-06-09 DIAGNOSIS — B351 Tinea unguium: Secondary | ICD-10-CM

## 2015-06-09 DIAGNOSIS — I739 Peripheral vascular disease, unspecified: Secondary | ICD-10-CM

## 2015-06-09 NOTE — Progress Notes (Signed)
Patient ID: [name], female   DOB: [birth year], 82 y.o.   MRN: [id number]  Subjective: 82 y.o. returns the office today for painful, elongated, thickened toenails which she cannot trim herself. Denies any redness or drainage around the nails. Denies any acute changes since last appointment and no new complaints today. Denies any systemic complaints such as fevers, chills, nausea, vomiting.   Objective: AAO 3, NAD DP/PT pulses palpable 1/4, CRT less than 3 seconds Nails hypertrophic, dystrophic, elongated, brittle, discolored 10. There is tenderness overlying the nails 1-5 bilaterally. There is no surrounding erythema or drainage along the nail sites. No open lesions or pre-ulcerative lesions are identified. No other areas of tenderness bilateral lower extremities. No overlying edema, erythema, increased warmth. No pain with calf compression, swelling, warmth, erythema.  Assessment: Patient presents with symptomatic onychomycosis  Plan: -Treatment options including alternatives, risks, complications were discussed -Nails sharply debrided 10 without complication/bleeding. -Discussed daily foot inspection. If there are any changes, to call the office immediately.  -Follow-up in 3 months or sooner if any problems are to arise. In the meantime, encouraged to call the office with any questions, concerns, changes symptoms.  Celesta Gentile, DPM

## 2015-08-09 DIAGNOSIS — I129 Hypertensive chronic kidney disease with stage 1 through stage 4 chronic kidney disease, or unspecified chronic kidney disease: Secondary | ICD-10-CM | POA: Diagnosis not present

## 2015-08-09 DIAGNOSIS — R197 Diarrhea, unspecified: Secondary | ICD-10-CM | POA: Diagnosis not present

## 2015-08-09 DIAGNOSIS — R634 Abnormal weight loss: Secondary | ICD-10-CM | POA: Diagnosis not present

## 2015-08-09 DIAGNOSIS — E785 Hyperlipidemia, unspecified: Secondary | ICD-10-CM | POA: Diagnosis not present

## 2015-08-09 DIAGNOSIS — E559 Vitamin D deficiency, unspecified: Secondary | ICD-10-CM | POA: Diagnosis not present

## 2015-08-09 DIAGNOSIS — I75029 Atheroembolism of unspecified lower extremity: Secondary | ICD-10-CM | POA: Diagnosis not present

## 2015-08-11 DIAGNOSIS — H524 Presbyopia: Secondary | ICD-10-CM | POA: Diagnosis not present

## 2015-08-11 DIAGNOSIS — H5210 Myopia, unspecified eye: Secondary | ICD-10-CM | POA: Diagnosis not present

## 2015-09-22 ENCOUNTER — Ambulatory Visit: Payer: Commercial Managed Care - HMO | Admitting: Podiatry

## 2015-10-13 ENCOUNTER — Encounter: Payer: Self-pay | Admitting: Podiatry

## 2015-10-13 ENCOUNTER — Ambulatory Visit (INDEPENDENT_AMBULATORY_CARE_PROVIDER_SITE_OTHER): Payer: Commercial Managed Care - HMO | Admitting: Podiatry

## 2015-10-13 DIAGNOSIS — I739 Peripheral vascular disease, unspecified: Secondary | ICD-10-CM

## 2015-10-13 DIAGNOSIS — M79676 Pain in unspecified toe(s): Secondary | ICD-10-CM | POA: Diagnosis not present

## 2015-10-13 DIAGNOSIS — B351 Tinea unguium: Secondary | ICD-10-CM | POA: Diagnosis not present

## 2015-10-16 NOTE — Progress Notes (Signed)
Patient ID: [name], female   DOB: [birth year], 82 y.o.   MRN: [id number]  Subjective: 82 y.o. returns the office today for painful, elongated, thickened toenails which she cannot trim herself. Denies any redness or drainage around the nails. Denies any acute changes since last appointment and no new complaints today. Denies any systemic complaints such as fevers, chills, nausea, vomiting.   Objective: AAO 3, NAD DP/PT pulses palpable 1/4, CRT less than 3 seconds Nails hypertrophic, dystrophic, elongated, brittle, discolored 10. There is tenderness overlying the nails 1-5 bilaterally. There is no surrounding erythema or drainage along the nail sites. No open lesions or pre-ulcerative lesions are identified. No other areas of tenderness bilateral lower extremities. No overlying edema, erythema, increased warmth. No pain with calf compression, swelling, warmth, erythema.  Assessment: Patient presents with symptomatic onychomycosis  Plan: -Treatment options including alternatives, risks, complications were discussed -Nails sharply debrided 10 without complication/bleeding. -Discussed daily foot inspection. If there are any changes, to call the office immediately.  -Follow-up in 3 months or sooner if any problems are to arise. In the meantime, encouraged to call the office with any questions, concerns, changes symptoms.  Celesta Gentile, DPM

## 2016-01-17 ENCOUNTER — Encounter: Payer: Self-pay | Admitting: Podiatry

## 2016-01-17 ENCOUNTER — Ambulatory Visit (INDEPENDENT_AMBULATORY_CARE_PROVIDER_SITE_OTHER): Payer: Commercial Managed Care - HMO | Admitting: Podiatry

## 2016-01-17 DIAGNOSIS — B351 Tinea unguium: Secondary | ICD-10-CM | POA: Diagnosis not present

## 2016-01-17 DIAGNOSIS — M79676 Pain in unspecified toe(s): Secondary | ICD-10-CM

## 2016-01-17 DIAGNOSIS — I739 Peripheral vascular disease, unspecified: Secondary | ICD-10-CM

## 2016-01-17 NOTE — Progress Notes (Signed)
SUBJECTIVE Patient presents to office today complaining of elongated, thickened nails. Pain while ambulating in shoes. Patient is unable to trim their own nails.   Allergies  Allergen Reactions  . Hydrocodone     OBJECTIVE General Patient is awake, alert, and oriented x 3 and in no acute distress. Derm Skin is dry and supple bilateral. Negative open lesions or macerations. Remaining integument unremarkable. Nails are tender, long, thickened and dystrophic with subungual debris, consistent with onychomycosis, 1-5 bilateral. No signs of infection noted. Vasc  DP and PT pedal pulses palpable bilaterally. Temperature gradient within normal limits.  Neuro Epicritic and protective threshold sensation diminished bilaterally.  Musculoskeletal Exam No symptomatic pedal deformities noted bilateral. Muscular strength within normal limits.  ASSESSMENT 1. Dermatophytosis of nails bilateral 2. Onychomycosis of nail due to dermatophyte bilateral 3. Pain in foot bilateral  PLAN OF CARE Patient evaluated today. Instructed to maintain good pedal hygiene and foot care. Mechanical debridement of nails 1-5 bilaterally performed using a nail nipper. Filed with dremel without incident.  All patient questions were answered. Return to clinic in 3 mos.    Edrick Kins, DPM

## 2016-02-08 DIAGNOSIS — Z23 Encounter for immunization: Secondary | ICD-10-CM | POA: Diagnosis not present

## 2016-02-08 DIAGNOSIS — E039 Hypothyroidism, unspecified: Secondary | ICD-10-CM | POA: Diagnosis not present

## 2016-02-08 DIAGNOSIS — R935 Abnormal findings on diagnostic imaging of other abdominal regions, including retroperitoneum: Secondary | ICD-10-CM | POA: Diagnosis not present

## 2016-02-08 DIAGNOSIS — Z0001 Encounter for general adult medical examination with abnormal findings: Secondary | ICD-10-CM | POA: Diagnosis not present

## 2016-02-08 DIAGNOSIS — R197 Diarrhea, unspecified: Secondary | ICD-10-CM | POA: Diagnosis not present

## 2016-02-08 DIAGNOSIS — I129 Hypertensive chronic kidney disease with stage 1 through stage 4 chronic kidney disease, or unspecified chronic kidney disease: Secondary | ICD-10-CM | POA: Diagnosis not present

## 2016-02-08 DIAGNOSIS — N184 Chronic kidney disease, stage 4 (severe): Secondary | ICD-10-CM | POA: Diagnosis not present

## 2016-02-08 DIAGNOSIS — M810 Age-related osteoporosis without current pathological fracture: Secondary | ICD-10-CM | POA: Diagnosis not present

## 2016-02-08 DIAGNOSIS — E785 Hyperlipidemia, unspecified: Secondary | ICD-10-CM | POA: Diagnosis not present

## 2016-02-08 DIAGNOSIS — E559 Vitamin D deficiency, unspecified: Secondary | ICD-10-CM | POA: Diagnosis not present

## 2016-02-08 DIAGNOSIS — I75029 Atheroembolism of unspecified lower extremity: Secondary | ICD-10-CM | POA: Diagnosis not present

## 2016-04-17 ENCOUNTER — Ambulatory Visit: Payer: Commercial Managed Care - HMO | Admitting: Podiatry

## 2016-04-25 DIAGNOSIS — R32 Unspecified urinary incontinence: Secondary | ICD-10-CM | POA: Diagnosis not present

## 2016-05-04 ENCOUNTER — Ambulatory Visit: Payer: Commercial Managed Care - HMO | Admitting: Podiatry

## 2016-05-25 ENCOUNTER — Ambulatory Visit (INDEPENDENT_AMBULATORY_CARE_PROVIDER_SITE_OTHER): Payer: Medicare HMO | Admitting: Podiatry

## 2016-05-25 DIAGNOSIS — L603 Nail dystrophy: Secondary | ICD-10-CM | POA: Diagnosis not present

## 2016-05-25 DIAGNOSIS — M79609 Pain in unspecified limb: Secondary | ICD-10-CM | POA: Diagnosis not present

## 2016-05-25 DIAGNOSIS — L608 Other nail disorders: Secondary | ICD-10-CM | POA: Diagnosis not present

## 2016-05-25 DIAGNOSIS — B351 Tinea unguium: Secondary | ICD-10-CM | POA: Diagnosis not present

## 2016-06-03 NOTE — Progress Notes (Signed)
   SUBJECTIVE Patient  presents to office today complaining of elongated, thickened nails. Pain while ambulating in shoes. Patient is unable to trim their own nails.   OBJECTIVE General Patient is awake, alert, and oriented x 3 and in no acute distress. Derm Skin is dry and supple bilateral. Negative open lesions or macerations. Remaining integument unremarkable. Nails are tender, long, thickened and dystrophic with subungual debris, consistent with onychomycosis, 1-5 bilateral. No signs of infection noted. Vasc  DP and PT pedal pulses palpable bilaterally. Temperature gradient within normal limits.  Neuro Epicritic and protective threshold sensation diminished bilaterally.  Musculoskeletal Exam No symptomatic pedal deformities noted bilateral. Muscular strength within normal limits.  ASSESSMENT 1. Onychodystrophic nails 1-5 bilateral with hyperkeratosis of nails.  2. Onychomycosis of nail due to dermatophyte bilateral 3. Pain in foot bilateral  PLAN OF CARE 1. Patient evaluated today.  2. Instructed to maintain good pedal hygiene and foot care.  3. Mechanical debridement of nails 1-5 bilaterally performed using a nail nipper. Filed with dremel without incident.  4. Return to clinic in 3 mos.    Brent M. Evans, DPM [facility]  Dr. Brent M. Evans, DPM    [address]                                        [place], [place] [address]                Office [phone]  Fax [phone]      

## 2016-06-30 DIAGNOSIS — R32 Unspecified urinary incontinence: Secondary | ICD-10-CM | POA: Diagnosis not present

## 2016-07-31 DIAGNOSIS — R32 Unspecified urinary incontinence: Secondary | ICD-10-CM | POA: Diagnosis not present

## 2016-08-08 DIAGNOSIS — M810 Age-related osteoporosis without current pathological fracture: Secondary | ICD-10-CM | POA: Diagnosis not present

## 2016-08-08 DIAGNOSIS — N184 Chronic kidney disease, stage 4 (severe): Secondary | ICD-10-CM | POA: Diagnosis not present

## 2016-08-08 DIAGNOSIS — E785 Hyperlipidemia, unspecified: Secondary | ICD-10-CM | POA: Diagnosis not present

## 2016-08-08 DIAGNOSIS — I129 Hypertensive chronic kidney disease with stage 1 through stage 4 chronic kidney disease, or unspecified chronic kidney disease: Secondary | ICD-10-CM | POA: Diagnosis not present

## 2016-08-08 DIAGNOSIS — E559 Vitamin D deficiency, unspecified: Secondary | ICD-10-CM | POA: Diagnosis not present

## 2016-08-08 DIAGNOSIS — E039 Hypothyroidism, unspecified: Secondary | ICD-10-CM | POA: Diagnosis not present

## 2016-08-24 ENCOUNTER — Ambulatory Visit (INDEPENDENT_AMBULATORY_CARE_PROVIDER_SITE_OTHER): Payer: Medicare HMO | Admitting: Podiatry

## 2016-08-24 DIAGNOSIS — B351 Tinea unguium: Secondary | ICD-10-CM | POA: Diagnosis not present

## 2016-08-24 DIAGNOSIS — M79609 Pain in unspecified limb: Secondary | ICD-10-CM | POA: Diagnosis not present

## 2016-08-24 DIAGNOSIS — L603 Nail dystrophy: Secondary | ICD-10-CM

## 2016-08-24 DIAGNOSIS — L608 Other nail disorders: Secondary | ICD-10-CM

## 2016-08-26 NOTE — Progress Notes (Signed)
   SUBJECTIVE Patient  presents to office today complaining of elongated, thickened nails. Pain while ambulating in shoes. Patient is unable to trim their own nails.   OBJECTIVE General Patient is awake, alert, and oriented x 3 and in no acute distress. Derm Skin is dry and supple bilateral. Negative open lesions or macerations. Remaining integument unremarkable. Nails are tender, long, thickened and dystrophic with subungual debris, consistent with onychomycosis, 1-5 bilateral. No signs of infection noted. Vasc  DP and PT pedal pulses palpable bilaterally. Temperature gradient within normal limits.  Neuro Epicritic and protective threshold sensation diminished bilaterally.  Musculoskeletal Exam No symptomatic pedal deformities noted bilateral. Muscular strength within normal limits.  ASSESSMENT 1. Onychodystrophic nails 1-5 bilateral with hyperkeratosis of nails.  2. Onychomycosis of nail due to dermatophyte bilateral 3. Pain in foot bilateral  PLAN OF CARE 1. Patient evaluated today.  2. Instructed to maintain good pedal hygiene and foot care.  3. Mechanical debridement of nails 1-5 bilaterally performed using a nail nipper. Filed with dremel without incident.  4. Return to clinic in 3 mos.    Koby Hartfield M. Yiannis Tulloch, DPM [facility]  Dr. Stuart Guillen M. Mavery Milling, DPM    [address]                                        [place], [place] [address]                Office [phone]  Fax [phone]      

## 2016-09-04 DIAGNOSIS — S3991XA Unspecified injury of abdomen, initial encounter: Secondary | ICD-10-CM | POA: Diagnosis not present

## 2016-09-04 DIAGNOSIS — N390 Urinary tract infection, site not specified: Secondary | ICD-10-CM | POA: Diagnosis not present

## 2016-09-04 DIAGNOSIS — R1011 Right upper quadrant pain: Secondary | ICD-10-CM | POA: Diagnosis not present

## 2016-09-04 DIAGNOSIS — Z9181 History of falling: Secondary | ICD-10-CM | POA: Diagnosis not present

## 2016-09-10 DIAGNOSIS — J069 Acute upper respiratory infection, unspecified: Secondary | ICD-10-CM | POA: Diagnosis not present

## 2016-09-10 DIAGNOSIS — R05 Cough: Secondary | ICD-10-CM | POA: Diagnosis not present

## 2016-09-10 DIAGNOSIS — R531 Weakness: Secondary | ICD-10-CM | POA: Diagnosis not present

## 2016-09-28 DIAGNOSIS — R32 Unspecified urinary incontinence: Secondary | ICD-10-CM | POA: Diagnosis not present

## 2016-10-30 DIAGNOSIS — R32 Unspecified urinary incontinence: Secondary | ICD-10-CM | POA: Diagnosis not present

## 2016-11-23 ENCOUNTER — Ambulatory Visit (INDEPENDENT_AMBULATORY_CARE_PROVIDER_SITE_OTHER): Payer: Medicare HMO | Admitting: Podiatry

## 2016-11-23 DIAGNOSIS — B351 Tinea unguium: Secondary | ICD-10-CM | POA: Diagnosis not present

## 2016-11-23 DIAGNOSIS — M79676 Pain in unspecified toe(s): Secondary | ICD-10-CM | POA: Diagnosis not present

## 2016-11-26 ENCOUNTER — Ambulatory Visit: Payer: Medicare HMO | Admitting: Podiatry

## 2016-11-28 DIAGNOSIS — R32 Unspecified urinary incontinence: Secondary | ICD-10-CM | POA: Diagnosis not present

## 2016-12-01 NOTE — Progress Notes (Signed)
   SUBJECTIVE Patient  presents to office today complaining of elongated, thickened nails. Pain while ambulating in shoes. Patient is unable to trim their own nails.   OBJECTIVE General Patient is awake, alert, and oriented x 3 and in no acute distress. Derm Skin is dry and supple bilateral. Negative open lesions or macerations. Remaining integument unremarkable. Nails are tender, long, thickened and dystrophic with subungual debris, consistent with onychomycosis, 1-5 bilateral. No signs of infection noted. Vasc  DP and PT pedal pulses palpable bilaterally. Temperature gradient within normal limits.  Neuro Epicritic and protective threshold sensation diminished bilaterally.  Musculoskeletal Exam No symptomatic pedal deformities noted bilateral. Muscular strength within normal limits.  ASSESSMENT 1. Onychodystrophic nails 1-5 bilateral with hyperkeratosis of nails.  2. Onychomycosis of nail due to dermatophyte bilateral 3. Pain in foot bilateral  PLAN OF CARE 1. Patient evaluated today.  2. Instructed to maintain good pedal hygiene and foot care.  3. Mechanical debridement of nails 1-5 bilaterally performed using a nail nipper. Filed with dremel without incident.  4. Return to clinic in 3 mos.    Rayneisha Bouza M. Shaylin Blatt, DPM [facility]  Dr. Katryn Plummer M. Chani Ghanem, DPM    [address]                                        [place], [place] [address]                Office [phone]  Fax [phone]      

## 2017-01-01 DIAGNOSIS — R32 Unspecified urinary incontinence: Secondary | ICD-10-CM | POA: Diagnosis not present

## 2017-01-30 DIAGNOSIS — R32 Unspecified urinary incontinence: Secondary | ICD-10-CM | POA: Diagnosis not present

## 2017-02-28 DIAGNOSIS — R32 Unspecified urinary incontinence: Secondary | ICD-10-CM | POA: Diagnosis not present

## 2017-03-04 DIAGNOSIS — Z23 Encounter for immunization: Secondary | ICD-10-CM | POA: Diagnosis not present

## 2017-03-04 DIAGNOSIS — E785 Hyperlipidemia, unspecified: Secondary | ICD-10-CM | POA: Diagnosis not present

## 2017-03-04 DIAGNOSIS — I10 Essential (primary) hypertension: Secondary | ICD-10-CM | POA: Diagnosis not present

## 2017-03-04 DIAGNOSIS — N184 Chronic kidney disease, stage 4 (severe): Secondary | ICD-10-CM | POA: Diagnosis not present

## 2017-03-04 DIAGNOSIS — M810 Age-related osteoporosis without current pathological fracture: Secondary | ICD-10-CM | POA: Diagnosis not present

## 2017-03-04 DIAGNOSIS — I739 Peripheral vascular disease, unspecified: Secondary | ICD-10-CM | POA: Diagnosis not present

## 2017-03-04 DIAGNOSIS — Z0000 Encounter for general adult medical examination without abnormal findings: Secondary | ICD-10-CM | POA: Diagnosis not present

## 2017-03-04 DIAGNOSIS — G301 Alzheimer's disease with late onset: Secondary | ICD-10-CM | POA: Diagnosis not present

## 2017-03-04 DIAGNOSIS — E7800 Pure hypercholesterolemia, unspecified: Secondary | ICD-10-CM | POA: Diagnosis not present

## 2017-03-04 DIAGNOSIS — E559 Vitamin D deficiency, unspecified: Secondary | ICD-10-CM | POA: Diagnosis not present

## 2017-03-26 ENCOUNTER — Ambulatory Visit: Payer: Medicare HMO | Admitting: Podiatry

## 2017-04-03 DIAGNOSIS — R32 Unspecified urinary incontinence: Secondary | ICD-10-CM | POA: Diagnosis not present

## 2017-04-05 ENCOUNTER — Ambulatory Visit: Payer: Medicare HMO | Admitting: Podiatry

## 2017-04-12 ENCOUNTER — Ambulatory Visit: Payer: Medicare HMO | Admitting: Podiatry

## 2017-04-19 ENCOUNTER — Ambulatory Visit: Payer: Medicare HMO | Admitting: Podiatry

## 2017-05-01 DIAGNOSIS — R32 Unspecified urinary incontinence: Secondary | ICD-10-CM | POA: Diagnosis not present

## 2017-05-03 ENCOUNTER — Ambulatory Visit: Payer: Medicare HMO | Admitting: Podiatry

## 2017-05-17 ENCOUNTER — Ambulatory Visit: Payer: Medicare HMO | Admitting: Podiatry

## 2017-05-17 DIAGNOSIS — B351 Tinea unguium: Secondary | ICD-10-CM

## 2017-05-17 DIAGNOSIS — M79676 Pain in unspecified toe(s): Secondary | ICD-10-CM

## 2017-05-20 NOTE — Progress Notes (Signed)
   SUBJECTIVE Patient presents to office today complaining of elongated, thickened nails. Pain while ambulating in shoes. Patient is unable to trim their own nails.   No past medical history on file.  OBJECTIVE General Patient is awake, alert, and oriented x 3 and in no acute distress. Derm Skin is dry and supple bilateral. Negative open lesions or macerations. Remaining integument unremarkable. Nails are tender, long, thickened and dystrophic with subungual debris, consistent with onychomycosis, 1-5 bilateral. No signs of infection noted. Vasc  DP and PT pedal pulses palpable bilaterally. Temperature gradient within normal limits.  Neuro Epicritic and protective threshold sensation diminished bilaterally.  Musculoskeletal Exam No symptomatic pedal deformities noted bilateral. Muscular strength within normal limits.  ASSESSMENT 1. Onychodystrophic nails 1-5 bilateral with hyperkeratosis of nails.  2. Onychomycosis of nail due to dermatophyte bilateral 3. Pain in foot bilateral  PLAN OF CARE 1. Patient evaluated today.  2. Instructed to maintain good pedal hygiene and foot care.  3. Mechanical debridement of nails 1-5 bilaterally performed using a nail nipper. Filed with dremel without incident.  4. Return to clinic in 3 mos.    Edrick Kins, DPM [facility]  Dr. Edrick Kins, [address]                                        [place], [place] [address]                Office [phone]  Fax [phone]

## 2017-05-31 DIAGNOSIS — R32 Unspecified urinary incontinence: Secondary | ICD-10-CM | POA: Diagnosis not present

## 2017-06-12 ENCOUNTER — Encounter (HOSPITAL_COMMUNITY): Payer: Self-pay | Admitting: Internal Medicine

## 2017-06-12 ENCOUNTER — Inpatient Hospital Stay (HOSPITAL_COMMUNITY): Payer: Medicare HMO

## 2017-06-12 ENCOUNTER — Inpatient Hospital Stay (HOSPITAL_COMMUNITY)
Admission: AD | Admit: 2017-06-12 | Discharge: 2017-06-21 | DRG: 444 | Disposition: A | Discharge status: Skilled Nursing Facility | Payer: Medicare HMO | Source: Ambulatory Visit | Attending: Internal Medicine | Admitting: Internal Medicine

## 2017-06-12 ENCOUNTER — Other Ambulatory Visit: Payer: Self-pay

## 2017-06-12 DIAGNOSIS — K7689 Other specified diseases of liver: Secondary | ICD-10-CM | POA: Diagnosis not present

## 2017-06-12 DIAGNOSIS — Z79891 Long term (current) use of opiate analgesic: Secondary | ICD-10-CM

## 2017-06-12 DIAGNOSIS — R509 Fever, unspecified: Secondary | ICD-10-CM | POA: Diagnosis not present

## 2017-06-12 DIAGNOSIS — E43 Unspecified severe protein-calorie malnutrition: Secondary | ICD-10-CM | POA: Diagnosis not present

## 2017-06-12 DIAGNOSIS — R748 Abnormal levels of other serum enzymes: Secondary | ICD-10-CM | POA: Diagnosis not present

## 2017-06-12 DIAGNOSIS — I739 Peripheral vascular disease, unspecified: Secondary | ICD-10-CM | POA: Diagnosis not present

## 2017-06-12 DIAGNOSIS — Z90710 Acquired absence of both cervix and uterus: Secondary | ICD-10-CM | POA: Diagnosis not present

## 2017-06-12 DIAGNOSIS — M4854XD Collapsed vertebra, not elsewhere classified, thoracic region, subsequent encounter for fracture with routine healing: Secondary | ICD-10-CM | POA: Diagnosis present

## 2017-06-12 DIAGNOSIS — B9620 Unspecified Escherichia coli [E. coli] as the cause of diseases classified elsewhere: Secondary | ICD-10-CM | POA: Diagnosis present

## 2017-06-12 DIAGNOSIS — Z8543 Personal history of malignant neoplasm of ovary: Secondary | ICD-10-CM | POA: Diagnosis not present

## 2017-06-12 DIAGNOSIS — M810 Age-related osteoporosis without current pathological fracture: Secondary | ICD-10-CM | POA: Diagnosis present

## 2017-06-12 DIAGNOSIS — R269 Unspecified abnormalities of gait and mobility: Secondary | ICD-10-CM | POA: Diagnosis present

## 2017-06-12 DIAGNOSIS — I479 Paroxysmal tachycardia, unspecified: Secondary | ICD-10-CM | POA: Diagnosis not present

## 2017-06-12 DIAGNOSIS — J90 Pleural effusion, not elsewhere classified: Secondary | ICD-10-CM | POA: Diagnosis present

## 2017-06-12 DIAGNOSIS — K219 Gastro-esophageal reflux disease without esophagitis: Secondary | ICD-10-CM | POA: Diagnosis present

## 2017-06-12 DIAGNOSIS — K8050 Calculus of bile duct without cholangitis or cholecystitis without obstruction: Secondary | ICD-10-CM | POA: Diagnosis not present

## 2017-06-12 DIAGNOSIS — Z825 Family history of asthma and other chronic lower respiratory diseases: Secondary | ICD-10-CM | POA: Diagnosis not present

## 2017-06-12 DIAGNOSIS — F0390 Unspecified dementia without behavioral disturbance: Secondary | ICD-10-CM | POA: Diagnosis not present

## 2017-06-12 DIAGNOSIS — Z7983 Long term (current) use of bisphosphonates: Secondary | ICD-10-CM

## 2017-06-12 DIAGNOSIS — Z9049 Acquired absence of other specified parts of digestive tract: Secondary | ICD-10-CM

## 2017-06-12 DIAGNOSIS — F329 Major depressive disorder, single episode, unspecified: Secondary | ICD-10-CM | POA: Diagnosis present

## 2017-06-12 DIAGNOSIS — R531 Weakness: Secondary | ICD-10-CM | POA: Diagnosis not present

## 2017-06-12 DIAGNOSIS — K839 Disease of biliary tract, unspecified: Secondary | ICD-10-CM | POA: Diagnosis not present

## 2017-06-12 DIAGNOSIS — M1990 Unspecified osteoarthritis, unspecified site: Secondary | ICD-10-CM | POA: Diagnosis present

## 2017-06-12 DIAGNOSIS — K8309 Other cholangitis: Secondary | ICD-10-CM

## 2017-06-12 DIAGNOSIS — Z48815 Encounter for surgical aftercare following surgery on the digestive system: Secondary | ICD-10-CM | POA: Diagnosis not present

## 2017-06-12 DIAGNOSIS — Z9181 History of falling: Secondary | ICD-10-CM

## 2017-06-12 DIAGNOSIS — E785 Hyperlipidemia, unspecified: Secondary | ICD-10-CM | POA: Diagnosis not present

## 2017-06-12 DIAGNOSIS — R7881 Bacteremia: Secondary | ICD-10-CM | POA: Diagnosis present

## 2017-06-12 DIAGNOSIS — I480 Paroxysmal atrial fibrillation: Secondary | ICD-10-CM | POA: Diagnosis present

## 2017-06-12 DIAGNOSIS — I10 Essential (primary) hypertension: Secondary | ICD-10-CM | POA: Diagnosis not present

## 2017-06-12 DIAGNOSIS — Z66 Do not resuscitate: Secondary | ICD-10-CM | POA: Diagnosis present

## 2017-06-12 DIAGNOSIS — I272 Pulmonary hypertension, unspecified: Secondary | ICD-10-CM | POA: Diagnosis present

## 2017-06-12 DIAGNOSIS — R7989 Other specified abnormal findings of blood chemistry: Secondary | ICD-10-CM | POA: Diagnosis not present

## 2017-06-12 DIAGNOSIS — Z79899 Other long term (current) drug therapy: Secondary | ICD-10-CM

## 2017-06-12 DIAGNOSIS — Z885 Allergy status to narcotic agent status: Secondary | ICD-10-CM | POA: Diagnosis not present

## 2017-06-12 DIAGNOSIS — Z6820 Body mass index (BMI) 20.0-20.9, adult: Secondary | ICD-10-CM | POA: Diagnosis not present

## 2017-06-12 DIAGNOSIS — K8030 Calculus of bile duct with cholangitis, unspecified, without obstruction: Principal | ICD-10-CM | POA: Diagnosis present

## 2017-06-12 DIAGNOSIS — I499 Cardiac arrhythmia, unspecified: Secondary | ICD-10-CM | POA: Diagnosis present

## 2017-06-12 DIAGNOSIS — I4891 Unspecified atrial fibrillation: Secondary | ICD-10-CM | POA: Diagnosis not present

## 2017-06-12 DIAGNOSIS — I351 Nonrheumatic aortic (valve) insufficiency: Secondary | ICD-10-CM | POA: Diagnosis present

## 2017-06-12 DIAGNOSIS — K8051 Calculus of bile duct without cholangitis or cholecystitis with obstruction: Secondary | ICD-10-CM | POA: Diagnosis not present

## 2017-06-12 DIAGNOSIS — J9811 Atelectasis: Secondary | ICD-10-CM | POA: Diagnosis not present

## 2017-06-12 DIAGNOSIS — K838 Other specified diseases of biliary tract: Secondary | ICD-10-CM | POA: Diagnosis not present

## 2017-06-12 DIAGNOSIS — X58XXXD Exposure to other specified factors, subsequent encounter: Secondary | ICD-10-CM | POA: Diagnosis present

## 2017-06-12 DIAGNOSIS — G8929 Other chronic pain: Secondary | ICD-10-CM | POA: Diagnosis present

## 2017-06-12 HISTORY — DX: Hyperlipidemia, unspecified: E78.5

## 2017-06-12 HISTORY — DX: Depression, unspecified: F32.A

## 2017-06-12 HISTORY — DX: Essential (primary) hypertension: I10

## 2017-06-12 HISTORY — DX: Unspecified dementia, unspecified severity, without behavioral disturbance, psychotic disturbance, mood disturbance, and anxiety: F03.90

## 2017-06-12 HISTORY — DX: Nonrheumatic aortic (valve) insufficiency: I35.1

## 2017-06-12 HISTORY — DX: Malignant neoplasm of unspecified ovary: C56.9

## 2017-06-12 HISTORY — DX: Unspecified atrial fibrillation: I48.91

## 2017-06-12 HISTORY — DX: Major depressive disorder, single episode, unspecified: F32.9

## 2017-06-12 HISTORY — DX: Biliary acute pancreatitis without necrosis or infection: K85.10

## 2017-06-12 LAB — CBC
HEMATOCRIT: 39 % (ref 36.0–46.0)
Hemoglobin: 12.9 g/dL (ref 12.0–15.0)
MCH: 29.6 pg (ref 26.0–34.0)
MCHC: 33.1 g/dL (ref 30.0–36.0)
MCV: 89.4 fL (ref 78.0–100.0)
Platelets: 248 10*3/uL (ref 150–400)
RBC: 4.36 MIL/uL (ref 3.87–5.11)
RDW: 14.1 % (ref 11.5–15.5)
WBC: 13.9 10*3/uL — AB (ref 4.0–10.5)

## 2017-06-12 LAB — URINALYSIS, ROUTINE W REFLEX MICROSCOPIC
BACTERIA UA: NONE SEEN
Glucose, UA: 50 mg/dL — AB
HGB URINE DIPSTICK: NEGATIVE
Ketones, ur: NEGATIVE mg/dL
Nitrite: NEGATIVE
PROTEIN: 30 mg/dL — AB
SPECIFIC GRAVITY, URINE: 1.024 (ref 1.005–1.030)
pH: 5 (ref 5.0–8.0)

## 2017-06-12 LAB — COMPREHENSIVE METABOLIC PANEL
ALT: 713 U/L — ABNORMAL HIGH (ref 14–54)
ANION GAP: 10 (ref 5–15)
AST: 812 U/L — AB (ref 15–41)
Albumin: 3.3 g/dL — ABNORMAL LOW (ref 3.5–5.0)
Alkaline Phosphatase: 735 U/L — ABNORMAL HIGH (ref 38–126)
BUN: 26 mg/dL — AB (ref 6–20)
CO2: 28 mmol/L (ref 22–32)
Calcium: 9.3 mg/dL (ref 8.9–10.3)
Chloride: 100 mmol/L — ABNORMAL LOW (ref 101–111)
Creatinine, Ser: 0.94 mg/dL (ref 0.44–1.00)
GFR calc Af Amer: 57 mL/min — ABNORMAL LOW (ref >60)
GFR, EST NON AFRICAN AMERICAN: 50 mL/min — AB (ref >60)
Glucose, Bld: 182 mg/dL — ABNORMAL HIGH (ref 65–99)
POTASSIUM: 4 mmol/L (ref 3.5–5.1)
Sodium: 138 mmol/L (ref 135–145)
Total Bilirubin: 2.7 mg/dL — ABNORMAL HIGH (ref 0.3–1.2)
Total Protein: 6.8 g/dL (ref 6.5–8.1)

## 2017-06-12 LAB — ACETAMINOPHEN LEVEL: Acetaminophen (Tylenol), Serum: <10 ug/mL — ABNORMAL LOW (ref 10–30)

## 2017-06-12 LAB — CK TOTAL AND CKMB (NOT AT ARMC): CK, MB: 8.6 ng/mL — ABNORMAL HIGH (ref 0.5–5.0)

## 2017-06-12 LAB — TROPONIN I
TROPONIN I: 0.08 ng/mL — AB (ref <0.03)
TROPONIN I: 0.07 ng/mL — AB (ref <0.03)

## 2017-06-12 LAB — HEMOGLOBIN A1C
HEMOGLOBIN A1C: 6 % — AB (ref 4.8–5.6)
Mean Plasma Glucose: 125.5 mg/dL

## 2017-06-12 MED ORDER — SODIUM CHLORIDE 0.9 % IV SOLN
INTRAVENOUS | Status: AC
  Administered 2017-06-12: 18:00:00 via INTRAVENOUS

## 2017-06-12 MED ORDER — ENOXAPARIN SODIUM 40 MG/0.4ML ~~LOC~~ SOLN
40.0000 mg | SUBCUTANEOUS | Status: DC
  Administered 2017-06-12: 40 mg via SUBCUTANEOUS
  Filled 2017-06-12: qty 0.4

## 2017-06-12 MED ORDER — ACETAMINOPHEN 650 MG RE SUPP: 650.0000 mg | Freq: Four times a day (QID) | RECTAL | Status: DC | PRN

## 2017-06-12 MED ORDER — SODIUM CHLORIDE 0.9 % IJ SOLN
INTRAMUSCULAR | Status: AC
  Filled 2017-06-12: qty 50

## 2017-06-12 MED ORDER — ACETAMINOPHEN 325 MG PO TABS: 650.0000 mg | ORAL_TABLET | Freq: Four times a day (QID) | ORAL | Status: DC | PRN

## 2017-06-12 MED ORDER — IOPAMIDOL (ISOVUE-300) INJECTION 61%
INTRAVENOUS | Status: AC
Start: 2017-06-12 — End: 2017-06-12
  Administered 2017-06-12: 100 mL via INTRAVENOUS
  Filled 2017-06-12: qty 100

## 2017-06-12 MED ORDER — IOPAMIDOL (ISOVUE-300) INJECTION 61%
INTRAVENOUS | Status: AC
  Filled 2017-06-12: qty 30

## 2017-06-12 MED ORDER — IOPAMIDOL (ISOVUE-300) INJECTION 61%: 30.0000 mL | Freq: Once | INTRAVENOUS | Status: DC | PRN

## 2017-06-12 NOTE — H&P (Signed)
H&P   Patient Demographics:    [name], is a 84 y.o. female  MRN: [id number]   DOB - Sep 08, 1918  Admit Date - 06/12/2017  Outpatient Primary MD for the patient is [name], MD  Referring MD/NP/PA:      Outpatient Specialists:     Patient coming from:  home  No chief complaint on file. Weakness, fever,    HPI:    [name]  is a 84 y.o. female, w dementia, hypertension, hyperlipidemia, depression, ? Circulation issues that apparently presents with c/o generalized weakness for the past 1 week.  Subjective fever.  Pt denies cough, cp, palp, sob, n/v, abd pain, diarrhea, brbpr, black stool.  + weight loss.    Pt has trop 0.08, and markedly abnormal liver function. Pt denies new medication. Pt denies any risk for viral hepatitis.      Review of systems:    In addition to the HPI above,   No Headache, No changes with Vision or hearing, No problems swallowing food or Liquids, No Chest pain, Cough or Shortness of Breath, No Abdominal pain, No Nausea or Vommitting, Bowel movements are regular, No Blood in stool or Urine, No dysuria, No new skin rashes or bruises, No new joints pains-aches,  No new weakness, tingling, numbness in any extremity, No recent weight gain or loss, No polyuria, polydypsia or polyphagia, No significant Mental Stressors.  A full 10 point Review of Systems was done, except as stated above, all other Review of Systems were negative.   With Past History of the following :    Past Medical History:  Diagnosis Date  . Dementia   . Depression   . Hyperlipidemia   . Hypertension   . Ovarian cancer [facility])       Past Surgical History:  Procedure Laterality Date  . ABDOMINAL HYSTERECTOMY        Social History:     Social History   Tobacco Use  . Smoking status: Never Smoker  . Smokeless tobacco: Never Used  Substance Use  Topics  . Alcohol use: No    Alcohol/week: 0.0 oz     Lives - at ALF, memory care in [place], [place].   Mobility - walks by self   Family History :     Family History  Problem Relation Age of Onset  . COPD Father       Home Medications:   Prior to Admission medications   Medication Sig Start Date End Date Taking? Authorizing Provider  alendronate (FOSAMAX) 70 MG tablet  02/12/15   [provider]  amLODipine (NORVASC) 2.5 MG tablet  01/26/15   [provider]  atenolol (TENORMIN) 50 MG tablet  01/26/15   [provider]  donepezil (ARICEPT) 10 MG tablet  02/10/15   [provider]  fentaNYL (DURAGESIC - DOSED MCG/HR) 25 MCG/HR patch  02/08/15  [provider]  HYDROcodone-acetaminophen (NORCO/VICODIN) 5-325 MG tablet  12/07/14   [provider]  omeprazole (PRILOSEC) 20 MG capsule  02/04/15   [provider]  pentoxifylline (TRENTAL) 400 MG CR tablet  02/04/15   [provider]  pravastatin (PRAVACHOL) 80 MG tablet  02/05/15   [provider]  venlafaxine (EFFEXOR) 75 MG tablet  06/10/17   [provider]  venlafaxine XR (EFFEXOR-XR) 75 MG 24 hr capsule  01/28/15   [provider]     Allergies:     Allergies  Allergen Reactions  . Hydrocodone      Physical Exam:   Vitals  Blood pressure (!) 140/47, pulse 70, temperature 98 F (36.7 C), temperature source Oral, resp. rate 18, height 5\' 7"  (1.702 m), weight 58.1 kg (128 lb 1.4 oz), SpO2 96 %.   1. General lying in bed in NAD,    2. Normal affect and insight, Not Suicidal or Homicidal, Awake Alert, Oriented X 3.  3. No F.N deficits, ALL C.Nerves Intact, Strength 5/5 all 4 extremities, Sensation intact all 4 extremities, Plantars down going.  4. Ears and Eyes appear Normal, Conjunctivae clear, PERRLA. Moist Oral Mucosa.  5. Supple Neck, No JVD, No cervical lymphadenopathy appriciated, No Carotid Bruits.  6.  Symmetrical Chest wall movement, Good air movement bilaterally, CTAB.  7. RRR, No Gallops, Rubs or Murmurs, No Parasternal Heave.  8. Positive Bowel Sounds, Abdomen Soft, No tenderness, No organomegaly appriciated,No rebound -guarding or rigidity.  9.  No Cyanosis, Normal Skin Turgor, No Skin Rash or Bruise.  10. Good muscle tone,  joints appear normal , no effusions, Normal ROM.  11. No Palpable Lymph Nodes in Neck or Axillae  No scleral icterus, no spider, no asterixis.    Data Review:    CBC Recent Labs  Lab 06/12/17 1723  WBC 13.9*  HGB 12.9  HCT 39.0  PLT 248  MCV 89.4  MCH 29.6  MCHC 33.1  RDW 14.1   ------------------------------------------------------------------------------------------------------------------  Chemistries  Recent Labs  Lab 06/12/17 1723  NA 138  K 4.0  CL 100*  CO2 28  GLUCOSE 182*  BUN 26*  CREATININE 0.94  CALCIUM 9.3  AST 812*  ALT 713*  ALKPHOS 735*  BILITOT 2.7*   ------------------------------------------------------------------------------------------------------------------ estimated creatinine clearance is 32.1 mL/min (by C-G formula based on SCr of 0.94 mg/dL). ------------------------------------------------------------------------------------------------------------------ No results for input(s): TSH, T4TOTAL, T3FREE, THYROIDAB in the last 72 hours.  Invalid input(s): FREET3  Coagulation profile No results for input(s): INR, PROTIME in the last 168 hours. ------------------------------------------------------------------------------------------------------------------- No results for input(s): DDIMER in the last 72 hours. -------------------------------------------------------------------------------------------------------------------  Cardiac Enzymes Recent Labs  Lab 06/12/17 1723  TROPONINI 0.08*    ------------------------------------------------------------------------------------------------------------------ No results found for: BNP   ---------------------------------------------------------------------------------------------------------------  Urinalysis    Component Value Date/Time   COLORURINE YELLOW 01/23/2009 1153   APPEARANCEUR CLOUDY (A) 01/23/2009 1153   LABSPEC 1.016 01/23/2009 1153   PHURINE 6.5 01/23/2009 1153   GLUCOSEU 100 (A) 01/23/2009 1153   HGBUR MODERATE (A) 01/23/2009 1153   BILIRUBINUR NEGATIVE 01/23/2009 1153   KETONESUR NEGATIVE 01/23/2009 1153   PROTEINUR NEGATIVE 01/23/2009 1153   UROBILINOGEN 0.2 01/23/2009 1153   NITRITE NEGATIVE 01/23/2009 1153   LEUKOCYTESUR SMALL (A) 01/23/2009 1153    ----------------------------------------------------------------------------------------------------------------   Imaging Results:    No results found.     Assessment & Plan:    Active Problems:   Fever    Fever Blood culture x2 Urinalysis, Urine culture CXR CT abd/  pelvis Consider abx after urinalysis  Abnormal liver function Acute hepatitis panel Tylenol level STOP Pravastatin STOP Norco STOP Trental CT scan abd/ pelvis  Weakness CT brain   Troponin elevation (no chest pain) Tele Trop I q6h x3 Check cardiac echo  Hypertension Cont atenolol Hold Amlodipine  Hyperlipidemia STOP Pravastatin  Dementia Cont aricept  Depression Hold Effexor  Chronic pain, arthritis Cont Fentanyl patch  Gerd Cont Prilosec  Osteoporosis STOP Fosamax  DVT Prophylaxis    SCDs  AM Labs Ordered, also please review Full Orders  Family Communication: Admission, patients condition and plan of care including tests being ordered have been discussed with the patient and daughter who indicate understanding and agree with the plan and Code Status.  Code Status DNR  Likely DC to  home  Condition GUARDED    Consults called:   none  Admission status: inpatient  Time spent in minutes : 45  Jani Gravel M.D on 06/12/2017 at 6:43 PM  Between 7am to 7am - Pager - [phone]

## 2017-06-12 NOTE — Progress Notes (Signed)
CRITICAL VALUE ALERT  Critical Value:  troponion 0.08  Date & Time Notied:  06/12/2017 1818  Provider Notified: Dr. Maudie Mercury  Orders Received/Actions taken: awaiting page

## 2017-06-13 ENCOUNTER — Inpatient Hospital Stay (HOSPITAL_COMMUNITY): Payer: Medicare HMO

## 2017-06-13 ENCOUNTER — Encounter (HOSPITAL_COMMUNITY): Payer: Self-pay | Admitting: Internal Medicine

## 2017-06-13 DIAGNOSIS — I351 Nonrheumatic aortic (valve) insufficiency: Secondary | ICD-10-CM

## 2017-06-13 DIAGNOSIS — K8050 Calculus of bile duct without cholangitis or cholecystitis without obstruction: Secondary | ICD-10-CM

## 2017-06-13 DIAGNOSIS — K8309 Other cholangitis: Secondary | ICD-10-CM

## 2017-06-13 LAB — COMPREHENSIVE METABOLIC PANEL
ALBUMIN: 2.8 g/dL — AB (ref 3.5–5.0)
ALK PHOS: 538 U/L — AB (ref 38–126)
ALT: 479 U/L — AB (ref 14–54)
AST: 315 U/L — AB (ref 15–41)
Anion gap: 9 (ref 5–15)
BUN: 23 mg/dL — AB (ref 6–20)
CALCIUM: 8.6 mg/dL — AB (ref 8.9–10.3)
CHLORIDE: 100 mmol/L — AB (ref 101–111)
CO2: 29 mmol/L (ref 22–32)
CREATININE: 0.99 mg/dL (ref 0.44–1.00)
GFR calc non Af Amer: 47 mL/min — ABNORMAL LOW (ref >60)
GFR, EST AFRICAN AMERICAN: 54 mL/min — AB (ref >60)
GLUCOSE: 142 mg/dL — AB (ref 65–99)
Potassium: 3 mmol/L — ABNORMAL LOW (ref 3.5–5.1)
SODIUM: 138 mmol/L (ref 135–145)
Total Bilirubin: 1.1 mg/dL (ref 0.3–1.2)
Total Protein: 6 g/dL — ABNORMAL LOW (ref 6.5–8.1)

## 2017-06-13 LAB — HEPATITIS PANEL, ACUTE
HCV Ab: <0.1 s/co ratio (ref 0.0–0.9)
HEP A IGM: NEGATIVE
HEP B C IGM: NEGATIVE
Hepatitis B Surface Ag: NEGATIVE

## 2017-06-13 LAB — BLOOD CULTURE ID PANEL (REFLEXED)
Acinetobacter baumannii: NOT DETECTED
CANDIDA GLABRATA: NOT DETECTED
CANDIDA KRUSEI: NOT DETECTED
CANDIDA PARAPSILOSIS: NOT DETECTED
Candida albicans: NOT DETECTED
Candida tropicalis: NOT DETECTED
Carbapenem resistance: NOT DETECTED
ESCHERICHIA COLI: DETECTED — AB
Enterobacter cloacae complex: NOT DETECTED
Enterobacteriaceae species: DETECTED — AB
Enterococcus species: NOT DETECTED
Haemophilus influenzae: NOT DETECTED
KLEBSIELLA PNEUMONIAE: NOT DETECTED
Klebsiella oxytoca: NOT DETECTED
Listeria monocytogenes: NOT DETECTED
Methicillin resistance: NOT DETECTED
Neisseria meningitidis: NOT DETECTED
PSEUDOMONAS AERUGINOSA: NOT DETECTED
Proteus species: NOT DETECTED
SERRATIA MARCESCENS: NOT DETECTED
STAPHYLOCOCCUS SPECIES: NOT DETECTED
STREPTOCOCCUS AGALACTIAE: NOT DETECTED
Staphylococcus aureus (BCID): NOT DETECTED
Streptococcus pneumoniae: NOT DETECTED
Streptococcus pyogenes: NOT DETECTED
Streptococcus species: NOT DETECTED
Vancomycin resistance: NOT DETECTED

## 2017-06-13 LAB — CBC
HCT: 35.7 % — ABNORMAL LOW (ref 36.0–46.0)
HEMOGLOBIN: 11.6 g/dL — AB (ref 12.0–15.0)
MCH: 29.3 pg (ref 26.0–34.0)
MCHC: 32.5 g/dL (ref 30.0–36.0)
MCV: 90.2 fL (ref 78.0–100.0)
PLATELETS: 245 10*3/uL (ref 150–400)
RBC: 3.96 MIL/uL (ref 3.87–5.11)
RDW: 14.1 % (ref 11.5–15.5)
WBC: 7.9 10*3/uL (ref 4.0–10.5)

## 2017-06-13 LAB — ECHOCARDIOGRAM COMPLETE
Height: 67 in
WEIGHTICAEL: 2045.87 [oz_av]

## 2017-06-13 LAB — PROTIME-INR
INR: 1.15
Prothrombin Time: 14.6 seconds (ref 11.4–15.2)

## 2017-06-13 LAB — TROPONIN I
TROPONIN I: 0.08 ng/mL — AB (ref <0.03)
TROPONIN I: 0.07 ng/mL — AB (ref <0.03)

## 2017-06-13 MED ORDER — DEXTROSE 5 % IV SOLN
1.0000 g | Freq: Three times a day (TID) | INTRAVENOUS | Status: DC
  Filled 2017-06-13 (×2): qty 1

## 2017-06-13 MED ORDER — LORAZEPAM 2 MG/ML IJ SOLN
0.5000 mg | Freq: Once | INTRAMUSCULAR | Status: AC
  Administered 2017-06-13: 0.5 mg via INTRAVENOUS
  Filled 2017-06-13: qty 1

## 2017-06-13 MED ORDER — SODIUM CHLORIDE 0.9 % IV SOLN
INTRAVENOUS | Status: DC
  Administered 2017-06-13: 1000 mL via INTRAVENOUS
  Administered 2017-06-14: 07:00:00 via INTRAVENOUS

## 2017-06-13 MED ORDER — AZTREONAM IN DEXTROSE 1 GM/50ML IV SOLN
1.0000 g | Freq: Three times a day (TID) | INTRAVENOUS | Status: DC
  Administered 2017-06-13: 1 g via INTRAVENOUS
  Filled 2017-06-13: qty 50

## 2017-06-13 MED ORDER — BOOST PLUS PO LIQD
237.0000 mL | Freq: Two times a day (BID) | ORAL | Status: DC
  Administered 2017-06-13 – 2017-06-21 (×9): 237 mL via ORAL
  Filled 2017-06-13 (×17): qty 237

## 2017-06-13 MED ORDER — PIPERACILLIN-TAZOBACTAM 3.375 G IVPB
3.3750 g | Freq: Three times a day (TID) | INTRAVENOUS | Status: DC
  Administered 2017-06-13 – 2017-06-16 (×8): 3.375 g via INTRAVENOUS
  Filled 2017-06-13 (×8): qty 50

## 2017-06-13 NOTE — Progress Notes (Signed)
Nutrition Brief Note  Patient identified on the Malnutrition Screening Tool (MST) Report  Pt is 84 y.o. Per pt's family, pt eats well when it is something she likes to eat. Pt from memory care unit. Pt with meal tray at bedside with milkshakes and chocolate. Pt ate breakfast and a milkshake from [place] so far today. Denies swallowing or chewing issues. Will order Boost but no further needs identified.   Wt Readings from Last 15 Encounters:  06/13/17 127 lb 13.9 oz (58 kg)    Body mass index is 20.03 kg/m. Patient meets criteria for normal based on current BMI.   Current diet order is regular, patient is consuming approximately 100% of meals at this time. Labs and medications reviewed.   No further nutrition interventions warranted at this time. If nutrition issues arise, please consult RD.   Clayton Bibles, MS, RD, [facility] Pager: [phone] After Hours Pager: [phone]

## 2017-06-13 NOTE — Consult Note (Signed)
Referring Provider: [facility]   Primary Care Physician:  [name], MD Primary Gastroenterologist:  Previously Delfin Edis,  MD  Reason for Consultation:  Abnormal LFTs, CBD stones on imaging  ASSESSMENT AND PLAN:    1. 84 yo female admitted with weakness, GNR bacteremia, markedly abnormal liver chemistries. CT scan suggests CBD stones with CBD wall enhancement. No duct dilation. There is concern for developing biloma / abscess based on a couple of hypoenhancing areas in liver.   -Cholangitis ?  Discontinue Azactam. Starting Zosyn -am labs -nothing drainable on imaging -needs ERCP if cardiac status okay. I spoke with Dr. Loletha Carrow who is on biliary backup. Plan is to perform ERCP tomorrow if okay from cardiac standpoint.  -Patient has hx of dementia, I will get family to concent -Stopping Lovenox, adding SCDs -PT / INR    2. Elevated troponin. -echo was ordered -Cardiology was called and suspects demand ischemia but will see if echo abnormal.   3. Compression fracture T10, T12 -getting MRI of T spine  HPI: [name] is a 84 y.o. female admitted yesterday with weakness, subjective fevers. Found to have trop on 0.08, abnormal liver chemistries and WBC of 14K . Bcx positive for GNR.  Hepatitis panel negative. CT scan suggests small right pleural effusion, hypoenhancing foci in right liver lobe (63mm) and also within dome of liver (1.9cm). An addendum to report describes at least 2 distal CBD stones with the low densities in right liver possibly reflecting biloma / abscess.   Patient denies any current or recent abdominal pain. No nausea. She has felt weak but no pain anywhere. Recently lost husband. She lives at home but children are involved. No chest pain, SOB, cough or any other general medical complaints. Her weight has been stable.    Past Medical History:  Diagnosis Date  . Dementia   . Depression   . Gallstone pancreatitis   . Hyperlipidemia   . Hypertension    . Ovarian cancer [facility])     Past Surgical History:  Procedure Laterality Date  . ABDOMINAL HYSTERECTOMY      Prior to Admission medications   Medication Sig Start Date End Date Taking? Authorizing Provider  alendronate (FOSAMAX) 70 MG tablet Take 70 mg by mouth once a week.  02/12/15  Yes [provider]  amLODipine (NORVASC) 2.5 MG tablet Take 2.5 mg by mouth daily.  01/26/15  Yes [provider]  atenolol (TENORMIN) 50 MG tablet Take 50 mg by mouth daily.  01/26/15  Yes [provider]  donepezil (ARICEPT) 10 MG tablet Take 10 mg by mouth at bedtime.  02/10/15  Yes [provider]  ergocalciferol (VITAMIN D2) 50000 units capsule Take 50,000 Units by mouth every 14 (fourteen) days.   Yes [provider]  feeding supplement (BOOST HIGH PROTEIN) LIQD Take 1 Container by mouth daily.   Yes [provider]  fentaNYL (DURAGESIC - DOSED MCG/HR) 25 MCG/HR patch Place 25 mcg onto the skin every 3 (three) days.  02/08/15  Yes [provider]  guaiFENesin (MUCINEX) 600 MG 12 hr tablet Take 600 mg by mouth 2 (two) times daily as needed for cough or to loosen phlegm.   Yes [provider]  HYDROcodone-acetaminophen (NORCO/VICODIN) 5-325 MG tablet Take 1 tablet by mouth every 4 (four) hours as needed for moderate pain or severe pain.  12/07/14  Yes [provider]  [place] (OYSTER CALCIUM) 500 MG TABS tablet Take 500 mg of elemental calcium by  mouth 2 (two) times daily.   Yes [provider]  pentoxifylline (TRENTAL) 400 MG CR tablet Take 400 mg by mouth daily.  02/04/15  Yes [provider]  pravastatin (PRAVACHOL) 80 MG tablet Take 80 mg by mouth daily.  02/05/15  Yes [provider]  venlafaxine XR (EFFEXOR-XR) 75 MG 24 hr capsule Take 75 mg by mouth daily with breakfast.  01/28/15  Yes [provider]    Current Facility-Administered Medications  Medication Dose Route Frequency Provider  Last Rate Last Dose  . 0.9 %  sodium chloride infusion   Intravenous Continuous Jani Gravel, MD      . acetaminophen (TYLENOL) tablet 650 mg  650 mg Oral Q6H PRN Jani Gravel, MD       Or  . acetaminophen (TYLENOL) suppository 650 mg  650 mg Rectal Q6H PRN Jani Gravel, MD      . aztreonam (AZACTAM) 1 GM IVPB  1 g Intravenous Lysle Dingwall, MD      . enoxaparin (LOVENOX) injection 40 mg  40 mg Subcutaneous Q24H Jani Gravel, MD   40 mg at 06/12/17 2158  . iopamidol (ISOVUE-300) 61 % injection 30 mL  30 mL Oral Once PRN Jani Gravel, MD        Allergies as of 06/12/2017 - Review Complete 06/12/2017  Allergen Reaction Noted  . Hydrocodone  07/14/2008    Family History  Problem Relation Age of Onset  . COPD Father     Social History   Socioeconomic History  . Marital status: Widowed    Spouse name: Not on file  . Number of children: Not on file  . Years of education: Not on file  . Highest education level: Not on file  Social Needs  . Financial resource strain: Not on file  . Food insecurity - worry: Not on file  . Food insecurity - inability: Not on file  . Transportation needs - medical: Not on file  . Transportation needs - non-medical: Not on file  Occupational History  . Not on file  Tobacco Use  . Smoking status: Never Smoker  . Smokeless tobacco: Never Used  Substance and Sexual Activity  . Alcohol use: No    Alcohol/week: 0.0 oz  . Drug use: No  . Sexual activity: No  Other Topics Concern  . Not on file  Social History Narrative  . Not on file    Review of Systems: All systems reviewed and negative except where noted in HPI.  Physical Exam: Vital signs in last 24 hours: Temp:  [97.8 F (36.6 C)-98 F (36.7 C)] 98 F (36.7 C) (02/07 0515) Pulse Rate:  [57-70] 57 (02/07 0515) Resp:  [18] 18 (02/07 0515) BP: (129-140)/(47-58) 129/58 (02/07 0515) SpO2:  [96 %-98 %] 97 % (02/07 0515) Weight:  [127 lb 13.9 oz (58 kg)-128 lb 1.4 oz (58.1 kg)] 127 lb 13.9 oz (58  kg) (02/07 0515) Last BM Date: (unable to state last BM) General:   Alert, well-developed,  white female in NAD Psych:  Pleasant, cooperative. Normal mood and affect. Eyes:  Pupils equal, sclera clear, no icterus.   Conjunctiva pink. Ears:  Normal auditory acuity. Nose:  No deformity, discharge,  or lesions. Neck:  Supple; no masses Lungs:  Clear throughout to auscultation.   No wheezes, crackles, or rhonchi.  Heart:  Regular rate , occasional irregular beat. No edema Abdomen:  Soft, non-distended, nontender, BS active, no palp mass    Rectal:  Deferred  Msk:  Symmetrical without gross deformities. . Neurologic:  Alert and  oriented x4;  grossly normal neurologically. Skin:  Intact without significant lesions or rashes..   Intake/Output from previous day: 02/06 0701 - 02/07 0700 In: 752.5 [P.O.:360; I.V.:392.5] Out: 1 [Urine:1] Intake/Output this shift: No intake/output data recorded.  Lab Results: Recent Labs    06/12/17 1723  WBC 13.9*  HGB 12.9  HCT 39.0  PLT 248   BMET Recent Labs    06/12/17 1723  NA 138  K 4.0  CL 100*  CO2 28  GLUCOSE 182*  BUN 26*  CREATININE 0.94  CALCIUM 9.3   LFT Recent Labs    06/12/17 1723  PROT 6.8  ALBUMIN 3.3*  AST 812*  ALT 713*  ALKPHOS 735*  BILITOT 2.7*   PT/INR No results for input(s): LABPROT, INR in the last 72 hours. Hepatitis Panel Recent Labs    06/12/17 1914  HEPBSAG Negative  HCVAB <0.1  HEPAIGM Negative  HEPBIGM Negative      Studies/Results: Dg Chest 2 View  Result Date: 06/12/2017 CLINICAL DATA:  Fever EXAM: CHEST  2 VIEW COMPARISON:  06/12/2017, 09/10/2016 FINDINGS: Negative for heart failure. Atherosclerotic calcification aortic arch. Mild right pleural effusion with mild right lower lobe atelectasis. No definite pneumonia. Multiple mild chronic compression fractures in the thoracic spine with kyphosis. Advanced degenerative change left shoulder joint IMPRESSION: Mild right lower lobe  atelectasis and small right effusion. No definite pneumonia. Electronically Signed   By: Franchot Gallo M.D.   On: 06/12/2017 20:35   Ct Head Wo Contrast  Result Date: 06/12/2017 CLINICAL DATA:  Dementia EXAM: CT HEAD WITHOUT CONTRAST TECHNIQUE: Contiguous axial images were obtained from the base of the skull through the vertex without intravenous contrast. COMPARISON:  10/07/2011 FINDINGS: Brain: Moderate atrophy, with progression since 2013. Moderate chronic microvascular ischemic change in the white matter, with progression. Hypodensity left thalamus consistent with chronic infarct. Negative for acute hemorrhage or mass.  No midline shift Vascular: Negative for vascular thrombosis Skull: Negative Sinuses/Orbits: Negative Other: None IMPRESSION: Progression of atrophy and chronic microvascular ischemia since 2013. No acute abnormality. Electronically Signed   By: Franchot Gallo M.D.   On: 06/12/2017 20:40   Ct Abdomen Pelvis W Contrast  Addendum Date: 06/13/2017   ADDENDUM REPORT: 06/13/2017 09:21 ADDENDUM: At least 2 stones in the distal common bile duct with prominent common bile duct wall enhancement. Consider cholangitis as cause of symptoms. These findings were discussed with Dr. Maudie Mercury. The 2 small low densities in the right liver could reflect developing biloma/abscess. No drainable collection. Electronically Signed   By: Monte Fantasia M.D.   On: 06/13/2017 09:21   Result Date: 06/13/2017 CLINICAL DATA:  Dementia generalize weakness fever positive weight loss abnormal liver function EXAM: CT ABDOMEN AND PELVIS WITH CONTRAST TECHNIQUE: Multidetector CT imaging of the abdomen and pelvis was performed using the standard protocol following bolus administration of intravenous contrast. CONTRAST:  158mL ISOVUE-300 IOPAMIDOL (ISOVUE-300) INJECTION 61% COMPARISON:  Radiograph 09/04/2016, CT chest 07/15/2008 FINDINGS: Lower chest: Small right-sided pleural effusion with partial atelectasis at the right base.  The left lung base is clear. Borderline heart size. Coronary vascular calcification. Hepatobiliary: Heterogeneous liver parenchyma with vague hypoenhancing foci in the right lobe, for example series 2, image number 18 measuring 3 mm, and within the dome of the liver measuring approximately 1.9 cm. Surgical clips in the gallbladder fossa. No biliary dilatation. Pancreas: Unremarkable. No pancreatic ductal dilatation or surrounding inflammatory changes. Spleen: Normal in size without focal  abnormality. Adrenals/Urinary Tract: Adrenal glands are within normal limits. Diffuse cortical thinning of the kidneys. No hydronephrosis. The bladder is unremarkable Stomach/Bowel: Stomach is nonenlarged. No dilated small bowel. No colon wall thickening. Sigmoid colon diverticular disease. Vascular/Lymphatic: Extensive aortic atherosclerosis without aneurysmal dilatation. No significantly enlarged lymph nodes. Reproductive: Status post hysterectomy. No adnexal masses. Other: Negative for free air or free fluid. Musculoskeletal: Degenerative changes of the spine. Trace retrolisthesis of L1 on L2. Mild to moderate compression deformities at T12 and T10. IMPRESSION: 1. Coarse heterogeneous appearance of the liver with diffuse decreased density suggesting steatosis or hepatocellular disease. There are vague indeterminate hypoenhancing lesions within the dome of the liver and right lobe, for which nonemergent liver MRI could be helpful to further evaluate. There is no biliary dilatation in this post cholecystectomy patient. 2. Sigmoid colon diverticular disease without acute inflammation 3. Small right pleural effusion with partial atelectasis at the right base 4. Mild to moderate compression deformities at T10 and T12. Electronically Signed: By: Donavan Foil M.D. On: 06/12/2017 20:33     Tye Savoy, NP-C @  06/13/2017, 10:22 AM  Pager number [phone]

## 2017-06-13 NOTE — Progress Notes (Addendum)
PHARMACY - PHYSICIAN COMMUNICATION CRITICAL VALUE ALERT - BLOOD CULTURE IDENTIFICATION (BCID)  Results for orders placed or performed during the hospital encounter of 06/12/17  Blood Culture ID Panel (Reflexed) (Collected: 06/12/2017  5:23 PM)  Result Value Ref Range   Enterococcus species NOT DETECTED NOT DETECTED   Vancomycin resistance NOT DETECTED NOT DETECTED   Listeria monocytogenes NOT DETECTED NOT DETECTED   Staphylococcus species NOT DETECTED NOT DETECTED   Staphylococcus aureus NOT DETECTED NOT DETECTED   Methicillin resistance NOT DETECTED NOT DETECTED   Streptococcus species NOT DETECTED NOT DETECTED   Streptococcus agalactiae NOT DETECTED NOT DETECTED   Streptococcus pneumoniae NOT DETECTED NOT DETECTED   Streptococcus pyogenes NOT DETECTED NOT DETECTED   Acinetobacter baumannii NOT DETECTED NOT DETECTED   Enterobacteriaceae species DETECTED (A) NOT DETECTED   Enterobacter cloacae complex NOT DETECTED NOT DETECTED   Escherichia coli DETECTED (A) NOT DETECTED   Klebsiella oxytoca NOT DETECTED NOT DETECTED   Klebsiella pneumoniae NOT DETECTED NOT DETECTED   Proteus species NOT DETECTED NOT DETECTED   Serratia marcescens NOT DETECTED NOT DETECTED   Carbapenem resistance NOT DETECTED NOT DETECTED   Haemophilus influenzae NOT DETECTED NOT DETECTED   Neisseria meningitidis NOT DETECTED NOT DETECTED   Pseudomonas aeruginosa NOT DETECTED NOT DETECTED   Candida albicans NOT DETECTED NOT DETECTED   Candida glabrata NOT DETECTED NOT DETECTED   Candida krusei NOT DETECTED NOT DETECTED   Candida parapsilosis NOT DETECTED NOT DETECTED   Candida tropicalis NOT DETECTED NOT DETECTED    Name of physician (or Provider) Contacted: Kim  Changes to prescribed antibiotics required: Not currently on any abx. Avoiding Rocephin d/t acute liver injury from unknown cause. Will start aztreonam 1g q8 hr with CrCl 32 ml/min  Reuel Boom, PharmD, BCPS [phone] 06/13/2017, 8:25 AM     ADDENDUM  Now seen by GI, requesting Zosyn per pharmacy for possible cholangitis. Aztreonam discontinued after 1 dose.  Zosyn 3.375 g IV q8 hr by extended infusion  Pharmacy will sign off but follow peripherally for renal adjustments and culture results.  Reuel Boom, PharmD, BCPS [phone] 06/13/2017, 12:53 PM

## 2017-06-13 NOTE — Progress Notes (Signed)
Report received from H. Holderness, RN.  Assessment unchanged. [name] B  

## 2017-06-13 NOTE — Progress Notes (Signed)
  Echocardiogram 2D Echocardiogram has been performed.  [name] T Malala Trenkamp 06/13/2017, 2:36 PM

## 2017-06-13 NOTE — Progress Notes (Signed)
Patient ID: [name], female   DOB: 1919/03/04, 84 y.o.   MRN: [id number]                                                                PROGRESS NOTE                                                                                                                                                                                                             Patient Demographics:    [name], is a 84 y.o. female, DOB - 07-11-1918, LFY:[id number]  Admit date - 06/12/2017   Admitting Physician Jani Gravel, MD  Outpatient Primary MD for the patient is [name], MD  LOS - 1  Outpatient Specialists:  No chief complaint on file. weakness  ? fever    Brief Narrative  84 y.o. female, w dementia, hypertension, hyperlipidemia, depression, ? Circulation issues that apparently presents with c/o generalized weakness for the past 1 week.  Subjective fever.  Pt denies cough, cp, palp, sob, n/v, abd pain, diarrhea, brbpr, black stool.  + weight loss.    Pt has trop 0.08, and markedly abnormal liver function. Pt denies new medication. Pt denies any risk for viral hepatitis.        Subjective:    [name] today still doesn't feel well.  Afebrile overnite.  E. Coli in her blood culture but no obvious source.   Denies abd pain, diarrhea, brbpr, dysuria, hematuria.  Pt has baseline dementia .    Assessment  & Plan :    Active Problems:   Fever   Abnormal liver function  cmp pending this am Just received call from radiology pt has cholelithiasis, will request Gi consultation   ? Fever prior to admission Blood culture + E. Coli.  Aztreonam pharmacy to dose  Troponin elevation Awaiting further trop Awaiting cardiac echo Cardiology consult for trop elevation Spoke with Cardiology, they think she has demand ischemia,  Recommend echo and if abnormal to reconsult  Compression fracture T10, T12 Check MRI T spine  Check vitamin D, tsh in am    Code Status :    DNR  Family  Communication  : attempted to call daughter x6 , no response this am.    Disposition Plan  :  ALF, [facility]  Barriers For Discharge :   Consults  :  GI, cardiology  Procedures  :    DVT Prophylaxis  :  Lovenox - SCDs  Lab Results  Component Value Date   PLT 248 06/12/2017    Antibiotics  :  aztreonam  Anti-infectives (From admission, onward)   None        Objective:   Vitals:   06/12/17 1651 06/12/17 2154 06/13/17 0515  BP: (!) 140/47 (!) 132/50 (!) 129/58  Pulse: 70 64 (!) 57  Resp: 18 18 18   Temp: 98 F (36.7 C) 97.8 F (36.6 C) 98 F (36.7 C)  TempSrc: Oral Oral Oral  SpO2: 96% 98% 97%  Weight: 58.1 kg (128 lb 1.4 oz)  58 kg (127 lb 13.9 oz)  Height: 5\' 7"  (1.702 m)      Wt Readings from Last 3 Encounters:  06/13/17 58 kg (127 lb 13.9 oz)     Intake/Output Summary (Last 24 hours) at 06/13/2017 0818 Last data filed at 06/13/2017 0200 Gross per 24 hour  Intake 752.5 ml  Output 1 ml  Net 751.5 ml     Physical Exam  Awake Alert, Oriented X 1, No new F.N deficits, Normal affect [place].AT,PERRAL Supple Neck,No JVD, No cervical lymphadenopathy appriciated.  Symmetrical Chest wall movement, Good air movement bilaterally, CTAB RRR,No Gallops,Rubs or new Murmurs, No Parasternal Heave +ve B.Sounds, Abd Soft, No tenderness, No organomegaly appriciated, No rebound - guarding or rigidity. No Cyanosis, Clubbing or edema, No new Rash or bruise     Data Review:    CBC Recent Labs  Lab 06/12/17 1723  WBC 13.9*  HGB 12.9  HCT 39.0  PLT 248  MCV 89.4  MCH 29.6  MCHC 33.1  RDW 14.1    Chemistries  Recent Labs  Lab 06/12/17 1723  NA 138  K 4.0  CL 100*  CO2 28  GLUCOSE 182*  BUN 26*  CREATININE 0.94  CALCIUM 9.3  AST 812*  ALT 713*  ALKPHOS 735*  BILITOT 2.7*   ------------------------------------------------------------------------------------------------------------------ No results for input(s): CHOL, HDL, LDLCALC, TRIG, CHOLHDL,  LDLDIRECT in the last 72 hours.  Lab Results  Component Value Date   HGBA1C 6.0 (H) 06/12/2017   ------------------------------------------------------------------------------------------------------------------ No results for input(s): TSH, T4TOTAL, T3FREE, THYROIDAB in the last 72 hours.  Invalid input(s): FREET3 ------------------------------------------------------------------------------------------------------------------ No results for input(s): VITAMINB12, FOLATE, FERRITIN, TIBC, IRON, RETICCTPCT in the last 72 hours.  Coagulation profile No results for input(s): INR, PROTIME in the last 168 hours.  No results for input(s): DDIMER in the last 72 hours.  Cardiac Enzymes Recent Labs  Lab 06/12/17 1723 06/12/17 2130 06/13/17 0435  CKMB 8.6*  --   --   TROPONINI 0.08* 0.07* 0.08*   ------------------------------------------------------------------------------------------------------------------ No results found for: BNP  Inpatient Medications  Scheduled Meds: . enoxaparin (LOVENOX) injection  40 mg Subcutaneous Q24H   Continuous Infusions: PRN Meds:.acetaminophen **OR** acetaminophen, iopamidol  Micro Results Recent Results (from the past 240 hour(s))  Culture, blood (routine x 2)     Status: None (Preliminary result)   Collection Time: 06/12/17  5:23 PM  Result Value Ref Range Status   Specimen Description   Final    BLOOD BLOOD LEFT ARM Performed at [facility] [address], [place], [place] [address]    Special Requests   Final    BOTTLES DRAWN AEROBIC AND ANAEROBIC Blood Culture adequate volume Performed at [facility] [address] [place], [place] [address]  Culture  Setup Time   Final    GRAM NEGATIVE RODS IN BOTH AEROBIC AND ANAEROBIC BOTTLES Organism ID to follow CRITICAL RESULT CALLED TO, READ BACK BY AND VERIFIED WITH: Melodye Ped PHARMD, AT 0801 06/13/17 BY Rush Landmark Performed at [facility], [place] [address] [place], [place] [address]    Culture GRAM NEGATIVE RODS  Final   Report Status PENDING  Incomplete  Blood Culture ID Panel (Reflexed)     Status: Abnormal   Collection Time: 06/12/17  5:23 PM  Result Value Ref Range Status   Enterococcus species NOT DETECTED NOT DETECTED Final   Vancomycin resistance NOT DETECTED NOT DETECTED Final   Listeria monocytogenes NOT DETECTED NOT DETECTED Final   Staphylococcus species NOT DETECTED NOT DETECTED Final   Staphylococcus aureus NOT DETECTED NOT DETECTED Final   Methicillin resistance NOT DETECTED NOT DETECTED Final   Streptococcus species NOT DETECTED NOT DETECTED Final   Streptococcus agalactiae NOT DETECTED NOT DETECTED Final   Streptococcus pneumoniae NOT DETECTED NOT DETECTED Final   Streptococcus pyogenes NOT DETECTED NOT DETECTED Final   Acinetobacter baumannii NOT DETECTED NOT DETECTED Final   Enterobacteriaceae species DETECTED (A) NOT DETECTED Final    Comment: CRITICAL RESULT CALLED TO, READ BACK BY AND VERIFIED WITH: J. GADHIA PHARMD, AT 0801 06/13/17 BY D. VANHOOK Enterobacteriaceae represent a large family of gram-negative bacteria, not a single organism.    Enterobacter cloacae complex NOT DETECTED NOT DETECTED Final   Escherichia coli DETECTED (A) NOT DETECTED Final    Comment: CRITICAL RESULT CALLED TO, READ BACK BY AND VERIFIED WITH: J. GADHIA PHARMD, AT 0801 06/13/17 BY D. VANHOOK    Klebsiella oxytoca NOT DETECTED NOT DETECTED Final   Klebsiella pneumoniae NOT DETECTED NOT DETECTED Final   Proteus species NOT DETECTED NOT DETECTED Final   Serratia marcescens NOT DETECTED NOT DETECTED Final   Carbapenem resistance NOT DETECTED NOT DETECTED Final   Haemophilus influenzae NOT DETECTED NOT DETECTED Final   Neisseria meningitidis NOT DETECTED NOT DETECTED Final   Pseudomonas aeruginosa NOT DETECTED NOT DETECTED Final   Candida albicans NOT DETECTED NOT DETECTED Final   Candida glabrata NOT DETECTED NOT DETECTED  Final   Candida krusei NOT DETECTED NOT DETECTED Final   Candida parapsilosis NOT DETECTED NOT DETECTED Final   Candida tropicalis NOT DETECTED NOT DETECTED Final    Comment: Performed at [facility], [place] [address] [place], [place] [address]  Culture, blood (routine x 2)     Status: None (Preliminary result)   Collection Time: 06/12/17  5:32 PM  Result Value Ref Range Status   Specimen Description   Final    BLOOD BLOOD RIGHT HAND Performed at [facility] [address] [place], [place] [address]    Special Requests   Final    BOTTLES DRAWN AEROBIC AND ANAEROBIC Blood Culture adequate volume Performed at [facility] [address], [place], [place] [address]    Culture  Setup Time   Final    GRAM NEGATIVE RODS IN BOTH AEROBIC AND ANAEROBIC BOTTLES CRITICAL VALUE NOTED.  VALUE IS CONSISTENT WITH PREVIOUSLY REPORTED AND CALLED VALUE. Performed at [facility], [place] [address], [place],  [address]    Culture GRAM NEGATIVE RODS  Final   Report Status PENDING  Incomplete    Radiology Reports Dg Chest 2 View  Result Date: 06/12/2017 CLINICAL DATA:  Fever EXAM: CHEST  2 VIEW COMPARISON:  06/12/2017, 09/10/2016 FINDINGS: Negative  for heart failure. Atherosclerotic calcification aortic arch. Mild right pleural effusion with mild right lower lobe atelectasis. No definite pneumonia. Multiple mild chronic compression fractures in the thoracic spine with kyphosis. Advanced degenerative change left shoulder joint IMPRESSION: Mild right lower lobe atelectasis and small right effusion. No definite pneumonia. Electronically Signed   By: Franchot Gallo M.D.   On: 06/12/2017 20:35   Ct Head Wo Contrast  Result Date: 06/12/2017 CLINICAL DATA:  Dementia EXAM: CT HEAD WITHOUT CONTRAST TECHNIQUE: Contiguous axial images were obtained from the base of the skull through the vertex without intravenous contrast. COMPARISON:  10/07/2011 FINDINGS:  Brain: Moderate atrophy, with progression since 2013. Moderate chronic microvascular ischemic change in the white matter, with progression. Hypodensity left thalamus consistent with chronic infarct. Negative for acute hemorrhage or mass.  No midline shift Vascular: Negative for vascular thrombosis Skull: Negative Sinuses/Orbits: Negative Other: None IMPRESSION: Progression of atrophy and chronic microvascular ischemia since 2013. No acute abnormality. Electronically Signed   By: Franchot Gallo M.D.   On: 06/12/2017 20:40   Ct Abdomen Pelvis W Contrast  Result Date: 06/12/2017 CLINICAL DATA:  Dementia generalize weakness fever positive weight loss abnormal liver function EXAM: CT ABDOMEN AND PELVIS WITH CONTRAST TECHNIQUE: Multidetector CT imaging of the abdomen and pelvis was performed using the standard protocol following bolus administration of intravenous contrast. CONTRAST:  126mL ISOVUE-300 IOPAMIDOL (ISOVUE-300) INJECTION 61% COMPARISON:  Radiograph 09/04/2016, CT chest 07/15/2008 FINDINGS: Lower chest: Small right-sided pleural effusion with partial atelectasis at the right base. The left lung base is clear. Borderline heart size. Coronary vascular calcification. Hepatobiliary: Heterogeneous liver parenchyma with vague hypoenhancing foci in the right lobe, for example series 2, image number 18 measuring 3 mm, and within the dome of the liver measuring approximately 1.9 cm. Surgical clips in the gallbladder fossa. No biliary dilatation. Pancreas: Unremarkable. No pancreatic ductal dilatation or surrounding inflammatory changes. Spleen: Normal in size without focal abnormality. Adrenals/Urinary Tract: Adrenal glands are within normal limits. Diffuse cortical thinning of the kidneys. No hydronephrosis. The bladder is unremarkable Stomach/Bowel: Stomach is nonenlarged. No dilated small bowel. No colon wall thickening. Sigmoid colon diverticular disease. Vascular/Lymphatic: Extensive aortic atherosclerosis  without aneurysmal dilatation. No significantly enlarged lymph nodes. Reproductive: Status post hysterectomy. No adnexal masses. Other: Negative for free air or free fluid. Musculoskeletal: Degenerative changes of the spine. Trace retrolisthesis of L1 on L2. Mild to moderate compression deformities at T12 and T10. IMPRESSION: 1. Coarse heterogeneous appearance of the liver with diffuse decreased density suggesting steatosis or hepatocellular disease. There are vague indeterminate hypoenhancing lesions within the dome of the liver and right lobe, for which nonemergent liver MRI could be helpful to further evaluate. There is no biliary dilatation in this post cholecystectomy patient. 2. Sigmoid colon diverticular disease without acute inflammation 3. Small right pleural effusion with partial atelectasis at the right base 4. Mild to moderate compression deformities at T10 and T12. Electronically Signed   By: Donavan Foil M.D.   On: 06/12/2017 20:33    Time Spent in minutes  30   Jani Gravel M.D on 06/13/2017 at 8:18 AM  Between 7am to 7am - Pager - [phone]

## 2017-06-14 ENCOUNTER — Inpatient Hospital Stay (HOSPITAL_COMMUNITY): Payer: Medicare HMO

## 2017-06-14 ENCOUNTER — Inpatient Hospital Stay (HOSPITAL_COMMUNITY): Payer: Medicare HMO | Admitting: Anesthesiology

## 2017-06-14 ENCOUNTER — Encounter (HOSPITAL_COMMUNITY)
Admission: AD | Disposition: A | Discharge status: Skilled Nursing Facility | Payer: Self-pay | Source: Ambulatory Visit | Attending: Internal Medicine

## 2017-06-14 ENCOUNTER — Encounter (HOSPITAL_COMMUNITY): Payer: Self-pay | Admitting: Internal Medicine

## 2017-06-14 DIAGNOSIS — K8050 Calculus of bile duct without cholangitis or cholecystitis without obstruction: Secondary | ICD-10-CM

## 2017-06-14 DIAGNOSIS — K8309 Other cholangitis: Secondary | ICD-10-CM

## 2017-06-14 DIAGNOSIS — K8030 Calculus of bile duct with cholangitis, unspecified, without obstruction: Principal | ICD-10-CM

## 2017-06-14 DIAGNOSIS — R748 Abnormal levels of other serum enzymes: Secondary | ICD-10-CM

## 2017-06-14 DIAGNOSIS — K838 Other specified diseases of biliary tract: Secondary | ICD-10-CM

## 2017-06-14 DIAGNOSIS — I479 Paroxysmal tachycardia, unspecified: Secondary | ICD-10-CM

## 2017-06-14 HISTORY — PX: ERCP: SHX5425

## 2017-06-14 LAB — COMPREHENSIVE METABOLIC PANEL
ALK PHOS: 605 U/L — AB (ref 38–126)
ALT: 679 U/L — ABNORMAL HIGH (ref 14–54)
AST: 720 U/L — AB (ref 15–41)
Albumin: 3 g/dL — ABNORMAL LOW (ref 3.5–5.0)
Anion gap: 9 (ref 5–15)
BUN: 16 mg/dL (ref 6–20)
CALCIUM: 8.3 mg/dL — AB (ref 8.9–10.3)
CHLORIDE: 103 mmol/L (ref 101–111)
CO2: 28 mmol/L (ref 22–32)
CREATININE: 0.89 mg/dL (ref 0.44–1.00)
GFR calc Af Amer: >60 mL/min (ref >60)
GFR calc non Af Amer: 53 mL/min — ABNORMAL LOW (ref >60)
Glucose, Bld: 148 mg/dL — ABNORMAL HIGH (ref 65–99)
Potassium: 3.4 mmol/L — ABNORMAL LOW (ref 3.5–5.1)
SODIUM: 140 mmol/L (ref 135–145)
Total Bilirubin: 1.3 mg/dL — ABNORMAL HIGH (ref 0.3–1.2)
Total Protein: 6.5 g/dL (ref 6.5–8.1)

## 2017-06-14 LAB — TROPONIN I
Troponin I: 0.06 ng/mL (ref <0.03)
Troponin I: 0.08 ng/mL (ref <0.03)
Troponin I: 0.06 ng/mL (ref <0.03)

## 2017-06-14 LAB — CBC
HCT: 39.5 % (ref 36.0–46.0)
Hemoglobin: 12.9 g/dL (ref 12.0–15.0)
MCH: 29.5 pg (ref 26.0–34.0)
MCHC: 32.7 g/dL (ref 30.0–36.0)
MCV: 90.2 fL (ref 78.0–100.0)
PLATELETS: 285 10*3/uL (ref 150–400)
RBC: 4.38 MIL/uL (ref 3.87–5.11)
RDW: 14.4 % (ref 11.5–15.5)
WBC: 8.2 10*3/uL (ref 4.0–10.5)

## 2017-06-14 LAB — URINE CULTURE

## 2017-06-14 LAB — MRSA PCR SCREENING: MRSA by PCR: NEGATIVE

## 2017-06-14 SURGERY — ERCP, WITH INTERVENTION IF INDICATED
Anesthesia: General

## 2017-06-14 MED ORDER — PHENYLEPHRINE HCL 10 MG/ML IJ SOLN
INTRAMUSCULAR | Status: DC | PRN
  Administered 2017-06-14: 40 ug via INTRAVENOUS

## 2017-06-14 MED ORDER — ONDANSETRON HCL 4 MG/2ML IJ SOLN
INTRAMUSCULAR | Status: DC | PRN
  Administered 2017-06-14: 4 mg via INTRAVENOUS

## 2017-06-14 MED ORDER — SUCCINYLCHOLINE CHLORIDE 20 MG/ML IJ SOLN
INTRAMUSCULAR | Status: DC | PRN
  Administered 2017-06-14: 60 mg via INTRAVENOUS

## 2017-06-14 MED ORDER — HYDRALAZINE HCL 20 MG/ML IJ SOLN
5.0000 mg | Freq: Four times a day (QID) | INTRAMUSCULAR | Status: DC | PRN
  Administered 2017-06-16 – 2017-06-20 (×3): 5 mg via INTRAVENOUS
  Filled 2017-06-14 (×4): qty 1

## 2017-06-14 MED ORDER — POTASSIUM CHLORIDE 10 MEQ/100ML IV SOLN
10.0000 meq | INTRAVENOUS | Status: AC
  Administered 2017-06-14 (×2): 10 meq via INTRAVENOUS
  Filled 2017-06-14 (×2): qty 100

## 2017-06-14 MED ORDER — METOPROLOL TARTRATE 5 MG/5ML IV SOLN
2.5000 mg | Freq: Four times a day (QID) | INTRAVENOUS | Status: DC
  Administered 2017-06-14 – 2017-06-16 (×7): 2.5 mg via INTRAVENOUS
  Filled 2017-06-14 (×8): qty 5

## 2017-06-14 MED ORDER — AMIODARONE LOAD VIA INFUSION
150.0000 mg | Freq: Once | INTRAVENOUS | Status: AC
  Administered 2017-06-14: 150 mg via INTRAVENOUS
  Filled 2017-06-14: qty 83.34

## 2017-06-14 MED ORDER — PROPOFOL 10 MG/ML IV BOLUS
INTRAVENOUS | Status: DC | PRN
  Administered 2017-06-14: 70 mg via INTRAVENOUS

## 2017-06-14 MED ORDER — LACTATED RINGERS IV SOLN
INTRAVENOUS | Status: DC
  Administered 2017-06-14: 12:00:00 via INTRAVENOUS

## 2017-06-14 MED ORDER — INDOMETHACIN 50 MG RE SUPP
RECTAL | Status: AC
  Filled 2017-06-14: qty 2

## 2017-06-14 MED ORDER — GLUCAGON HCL RDNA (DIAGNOSTIC) 1 MG IJ SOLR
INTRAMUSCULAR | Status: AC
  Filled 2017-06-14: qty 1

## 2017-06-14 MED ORDER — PROPOFOL 10 MG/ML IV BOLUS
INTRAVENOUS | Status: AC
  Filled 2017-06-14: qty 20

## 2017-06-14 MED ORDER — SODIUM CHLORIDE 0.9 % IV SOLN
INTRAVENOUS | Status: DC | PRN
  Administered 2017-06-14: 85 mL

## 2017-06-14 MED ORDER — INDOMETHACIN 50 MG RE SUPP: 100.0000 mg | Freq: Once | RECTAL | Status: DC

## 2017-06-14 MED ORDER — LIDOCAINE HCL (CARDIAC) 20 MG/ML IV SOLN
INTRAVENOUS | Status: DC | PRN
  Administered 2017-06-14: 40 mg via INTRAVENOUS

## 2017-06-14 MED ORDER — AMIODARONE HCL IN DEXTROSE 360-4.14 MG/200ML-% IV SOLN
30.0000 mg/h | INTRAVENOUS | Status: DC
  Administered 2017-06-15 – 2017-06-17 (×5): 30 mg/h via INTRAVENOUS
  Filled 2017-06-14 (×5): qty 200

## 2017-06-14 MED ORDER — AMIODARONE HCL IN DEXTROSE 360-4.14 MG/200ML-% IV SOLN
60.0000 mg/h | INTRAVENOUS | Status: AC
  Filled 2017-06-14 (×2): qty 200

## 2017-06-14 MED ORDER — CIPROFLOXACIN IN D5W 400 MG/200ML IV SOLN
INTRAVENOUS | Status: AC
  Filled 2017-06-14: qty 200

## 2017-06-14 MED ORDER — FENTANYL CITRATE (PF) 100 MCG/2ML IJ SOLN
INTRAMUSCULAR | Status: DC | PRN
  Administered 2017-06-14 (×2): 25 ug via INTRAVENOUS

## 2017-06-14 MED ORDER — INDOMETHACIN 50 MG RE SUPP
RECTAL | Status: DC | PRN
  Administered 2017-06-14 (×2): 50 mg via RECTAL

## 2017-06-14 MED ORDER — FENTANYL CITRATE (PF) 100 MCG/2ML IJ SOLN
INTRAMUSCULAR | Status: AC
  Filled 2017-06-14: qty 2

## 2017-06-14 MED ORDER — GLUCAGON HCL RDNA (DIAGNOSTIC) 1 MG IJ SOLR
INTRAMUSCULAR | Status: DC | PRN
  Administered 2017-06-14 (×2): .5 mg via INTRAVENOUS

## 2017-06-14 NOTE — Progress Notes (Signed)
Patient returned from ERCP. No change from am assessment. Pt is drowsy but alert. Will continue to monitor.

## 2017-06-14 NOTE — Anesthesia Procedure Notes (Signed)
Procedure Name: Intubation Date/Time: 06/14/2017 12:49 PM Performed by: Glory Buff, CRNA Pre-anesthesia Checklist: Patient identified, Emergency Drugs available, Suction available and Patient being monitored Patient Re-evaluated:Patient Re-evaluated prior to induction Oxygen Delivery Method: Circle system utilized Preoxygenation: Pre-oxygenation with 100% oxygen Induction Type: IV induction Ventilation: Mask ventilation without difficulty Laryngoscope Size: Miller and 3 Grade View: Grade I Tube type: Oral Tube size: 7.0 mm Number of attempts: 1 Airway Equipment and Method: Stylet and Oral airway Placement Confirmation: ETT inserted through vocal cords under direct vision,  positive ETCO2 and breath sounds checked- equal and bilateral Secured at: 20 cm Tube secured with: Tape Dental Injury: Teeth and Oropharynx as per pre-operative assessment

## 2017-06-14 NOTE — Progress Notes (Signed)
Dr. Maudie Mercury on call paged to make aware of patients abnormal heart rhythm. Awaiting call back.

## 2017-06-14 NOTE — Op Note (Signed)
[facility] Patient Name: [name] Procedure Date: 06/14/2017 MRN: [id number] Attending MD: Estill Cotta. Loletha Carrow , MD Date of Birth: [birth year] CSN: [id number] Age: 84 Admit Type: Inpatient Procedure:                ERCP Indications:              For therapy of bile duct stone(s), Ascending                            cholangitis, Elevated liver enzymes Providers:                Mallie Mussel L. Loletha Carrow, MD, Burtis Junes, RN, Alan Mulder,                            Technician, Rosario Adie, CRNA Referring MD:              Medicines:                General Anesthesia, Indomethacin 100 mg PR,                            Standing dose zosyn Complications:            No immediate complications. Estimated Blood Loss:     Estimated blood loss: none. Procedure:                Pre-Anesthesia Assessment:                           - Prior to the procedure, a History and Physical                            was performed, and patient medications and                            allergies were reviewed. The patient's tolerance of                            previous anesthesia was also reviewed. The risks                            and benefits of the procedure and the sedation                            options and risks were discussed with the patient.                            All questions were answered, and informed consent                            was obtained. Prior Anticoagulants: The patient has                            taken no previous anticoagulant or antiplatelet  agents. ASA Grade Assessment: III - A patient with                            severe systemic disease. After reviewing the risks                            and benefits, the patient was deemed in                            satisfactory condition to undergo the procedure.                           After obtaining informed consent, the scope was                            passed under direct  vision. Throughout the                            procedure, the patient's blood pressure, pulse, and                            oxygen saturations were monitored continuously. The                            [id number] scope was introduced through the                            mouth, and used to inject contrast into and used to                            inject contrast into the bile duct. The ERCP was                            performed with difficulty. The patient tolerated                            the procedure well. Scope In: Scope Out: Findings:      A scout film of the abdomen was obtained. Surgical clips, consistent       with a previous cholecystectomy, were seen in the area of the right       upper quadrant of the abdomen. A standard esophagogastroduodenoscopy       scope was used for the examination of the upper gastrointestinal tract.       The scope was passed under direct vision through the upper GI tract. The       upper GI tract was normal. The upper GI tract was traversed under direct       vision without detailed examination. The major papilla was normal. The       minor papilla was not found. 0.035 inch x 260 cm straight Hydra Jagwire       was passed into the biliary tree. The traction (standard) sphincterotome       was passed over the guidewire and the bile duct was then deeply       cannulated. Contrast was  injected. I personally interpreted the bile       duct images. There was brisk flow of contrast through the ducts. Image       quality was excellent. Contrast extended to the hepatic ducts. A       cholecystectomy had been performed. The lower third of the main bile       duct contained two stones, the largest of which was 10 mm in diameter.       The main bile duct was diffusely dilated. The largest diameter was 13       mm. A 6 mm biliary sphincterotomy was made with a traction (standard)       sphincterotome using blended current. A longer  sphincterotomy could not       be made because the papilla was small. There was no post-sphincterotomy       bleeding. Balloon sweep was performed with a 65mm balloon, and one of       the stones was extracted in fragments. However, this led to loss of wire       access. Wire access with an 0.025 inch x 450 cm wire was obtained and       cannulation achieved again after considerable effort due to a tortuous       distal CBD. The remaining stone was visualized (mobile in the duct) and       it was clear that it would not likely pass through the ampullary       orifice. Therefore, dilation of major papilla with a 29mm x 4 cm       Hurricaine balloon dilator was successful. The biliary tree was swept       with a 12 mm balloon starting at the bifurcation. The other stone was       removed fragmented. No stones remained, confirmed by an additional 55mm       balloon sweep and occlusion cholangiogram. The total fluoroscopy       exposure time was 13 minutes and 45 sceonds. Impression:               - The entire main bile duct was dilated.                           - The patient has had a cholecystectomy.                           - Choledocholithiasis was found. Complete removal                            was accomplished by biliary sphincterotomy and                            balloon extraction.                           - A biliary sphincterotomy was performed.                           - Major papilla was successfully dilated.                           - The biliary tree was swept. Moderate Sedation:  GETA Recommendation:           - Clear liquid diet.                           - NO SQ HEPARIN OR LOVENOX. Use SCDs for DVT                            prophylaxis.                           - Continue present medications. Procedure Code(s):        --- Professional ---                           [phone], Endoscopic retrograde                            cholangiopancreatography (ERCP); with  removal of                            calculi/debris from biliary/pancreatic duct(s)                           43262, Endoscopic retrograde                            cholangiopancreatography (ERCP); with                            sphincterotomy/papillotomy                           [phone], Unlisted procedure, biliary tract Diagnosis Code(s):        --- Professional ---                           Z90.49, Acquired absence of other specified parts                            of digestive tract                           K80.30, Calculus of bile duct with cholangitis,                            unspecified, without obstruction                           K83.0, Cholangitis                           R74.8, Abnormal levels of other serum enzymes                           K83.8, Other specified diseases of biliary tract CPT copyright 2016 [employer]. All rights reserved. The codes documented in this report are preliminary and upon coder review may  be revised to meet current compliance requirements. Henry L. Loletha Carrow, MD 06/14/2017  3:01:25 PM This report has been signed electronically. Number of Addenda: 0

## 2017-06-14 NOTE — Progress Notes (Signed)
[facility] Progress Note   Chief Complaint:   Abnormal liver chemistries, bile duct stones    SUBJECTIVE:    no complaints.   ASSESSMENT AND PLAN:   82. 84 yo female with E.coli bacteremia , ? Cholangitis with markedly abnormal liver chemistries / suggestion of CBD stones on imaging. Liver labs slightly worse overnight. WBC normalized on antibiotics. She has had a cholecystectomy. Spoke with daughter Manuela Schwartz yesterday to get consent for procedure.  -For ERCP today at 12:30.   2. Elevated Troponin. Episodes of atrial and ventricular arrhythmias overnight. Echo yesterday - EF 60-65%, moderate AR.  -Cardiology evaluated, to start IV amiodarone  So arrhythmias won't interfere with ERCP. Appreciate Cardiology's assistance.   3. Pleural effusion, large right, small left one   OBJECTIVE:      Vital signs in last 24 hours: Temp:  [97.8 F (36.6 C)-99.7 F (37.6 C)] 99.7 F (37.6 C) (02/08 0538) Pulse Rate:  [60-128] 128 (02/08 0538) Resp:  [15-18] 18 (02/08 0538) BP: (136-172)/(43-74) 165/65 (02/08 0538) SpO2:  [95 %-96 %] 95 % (02/08 0538) Weight:  [130 lb 8.2 oz (59.2 kg)] 130 lb 8.2 oz (59.2 kg) (02/08 0538) Last BM Date: (pt is unsure) General:   Alert, well-developed, white female  in NAD EENT:  Normal hearing, non icteric sclera, conjunctive pink.  Heart:  Regular rate and rhythm; no lower extremity edema Pulm: Normal respiratory effort, bibasilar crackles. Abdomen:  Soft, nondistended, nontender.  Normal bowel sounds, no masses felt. No hepatomegaly.    Neurologic:  Alert and  oriented x4;  grossly normal neurologically. Psych:  Pleasant, cooperative.  Normal mood and affect.   Intake/Output from previous day: 02/07 0701 - 02/08 0700 In: 8338 [P.O.:240; I.V.:1165; IV Piggyback:150] Out: 400 [Urine:400] Intake/Output this shift: Total I/O In: -  Out: 600 [Urine:600]  Lab Results: Recent Labs    06/12/17 1723 06/13/17 1110 06/14/17 0421    WBC 13.9* 7.9 8.2  HGB 12.9 11.6* 12.9  HCT 39.0 35.7* 39.5  PLT 248 245 285   BMET Recent Labs    06/12/17 1723 06/13/17 1110 06/14/17 0421  NA 138 138 140  K 4.0 3.0* 3.4*  CL 100* 100* 103  CO2 28 29 28   GLUCOSE 182* 142* 148*  BUN 26* 23* 16  CREATININE 0.94 0.99 0.89  CALCIUM 9.3 8.6* 8.3*   LFT Recent Labs    06/14/17 0421  PROT 6.5  ALBUMIN 3.0*  AST 720*  ALT 679*  ALKPHOS 605*  BILITOT 1.3*   PT/INR Recent Labs    06/13/17 1301  LABPROT 14.6  INR 1.15   Hepatitis Panel Recent Labs    06/12/17 1914  HEPBSAG Negative  HCVAB <0.1  HEPAIGM Negative  HEPBIGM Negative    Dg Chest 2 View  Result Date: 06/12/2017 CLINICAL DATA:  Fever EXAM: CHEST  2 VIEW COMPARISON:  06/12/2017, 09/10/2016 FINDINGS: Negative for heart failure. Atherosclerotic calcification aortic arch. Mild right pleural effusion with mild right lower lobe atelectasis. No definite pneumonia. Multiple mild chronic compression fractures in the thoracic spine with kyphosis. Advanced degenerative change left shoulder joint IMPRESSION: Mild right lower lobe atelectasis and small right effusion. No definite pneumonia. Electronically Signed   By: Franchot Gallo M.D.   On: 06/12/2017 20:35   Ct Head Wo Contrast  Result Date: 06/12/2017 CLINICAL DATA:  Dementia EXAM: CT HEAD WITHOUT CONTRAST TECHNIQUE: Contiguous axial images were obtained from the base of the skull through the vertex without intravenous contrast. COMPARISON:  10/07/2011 FINDINGS: Brain: Moderate atrophy, with progression since 2013. Moderate chronic microvascular ischemic change in the white matter, with progression. Hypodensity left thalamus consistent with chronic infarct. Negative for acute hemorrhage or mass.  No midline shift Vascular: Negative for vascular thrombosis Skull: Negative Sinuses/Orbits: Negative Other: None IMPRESSION: Progression of atrophy and chronic microvascular ischemia since 2013. No acute abnormality.  Electronically Signed   By: Franchot Gallo M.D.   On: 06/12/2017 20:40   Mr Thoracic Spine Wo Contrast  Result Date: 06/13/2017 CLINICAL DATA:  Thoracic spine fracture EXAM: MRI THORACIC SPINE WITHOUT CONTRAST TECHNIQUE: Multiplanar, multisequence MR imaging of the thoracic spine was performed. No intravenous contrast was administered. COMPARISON:  Chest radiograph 06/12/2017 FINDINGS: Alignment:  Increased thoracic kyphosis.  No static subluxation. Vertebrae: There is moderate height loss T9, T10 and T12 with depression of the superior endplates but no marrow edema. These findings were present on prior studies. Cord:  Normal signal and morphology. Paraspinal and other soft tissues: Large right and trace left pleural effusions. Disc levels: No large disc herniation.  No spinal canal stenosis. IMPRESSION: 1. Multilevel chronic vertebral body height loss, particularly at the T9, T10 and T12 levels. This is unchanged compared to multiple old studies. No acute compression fracture. 2. No thoracic spinal canal stenosis or cord abnormality. 3. Large right and small left pleural effusions Electronically Signed   By: Ulyses Jarred M.D.   On: 06/13/2017 20:10   Ct Abdomen Pelvis W Contrast  Addendum Date: 06/13/2017   ADDENDUM REPORT: 06/13/2017 09:21 ADDENDUM: At least 2 stones in the distal common bile duct with prominent common bile duct wall enhancement. Consider cholangitis as cause of symptoms. These findings were discussed with Dr. Maudie Mercury. The 2 small low densities in the right liver could reflect developing biloma/abscess. No drainable collection. Electronically Signed   By: Monte Fantasia M.D.   On: 06/13/2017 09:21   Result Date: 06/13/2017 CLINICAL DATA:  Dementia generalize weakness fever positive weight loss abnormal liver function EXAM: CT ABDOMEN AND PELVIS WITH CONTRAST TECHNIQUE: Multidetector CT imaging of the abdomen and pelvis was performed using the standard protocol following bolus administration  of intravenous contrast. CONTRAST:  183mL ISOVUE-300 IOPAMIDOL (ISOVUE-300) INJECTION 61% COMPARISON:  Radiograph 09/04/2016, CT chest 07/15/2008 FINDINGS: Lower chest: Small right-sided pleural effusion with partial atelectasis at the right base. The left lung base is clear. Borderline heart size. Coronary vascular calcification. Hepatobiliary: Heterogeneous liver parenchyma with vague hypoenhancing foci in the right lobe, for example series 2, image number 18 measuring 3 mm, and within the dome of the liver measuring approximately 1.9 cm. Surgical clips in the gallbladder fossa. No biliary dilatation. Pancreas: Unremarkable. No pancreatic ductal dilatation or surrounding inflammatory changes. Spleen: Normal in size without focal abnormality. Adrenals/Urinary Tract: Adrenal glands are within normal limits. Diffuse cortical thinning of the kidneys. No hydronephrosis. The bladder is unremarkable Stomach/Bowel: Stomach is nonenlarged. No dilated small bowel. No colon wall thickening. Sigmoid colon diverticular disease. Vascular/Lymphatic: Extensive aortic atherosclerosis without aneurysmal dilatation. No significantly enlarged lymph nodes. Reproductive: Status post hysterectomy. No adnexal masses. Other: Negative for free air or free fluid. Musculoskeletal: Degenerative changes of the spine. Trace retrolisthesis of L1 on L2. Mild to moderate compression deformities at T12 and T10. IMPRESSION: 1. Coarse heterogeneous appearance of the liver with diffuse decreased density suggesting steatosis or hepatocellular disease. There are vague indeterminate hypoenhancing lesions within the dome of the liver and right lobe, for which nonemergent liver MRI could be helpful to further evaluate. There is no  biliary dilatation in this post cholecystectomy patient. 2. Sigmoid colon diverticular disease without acute inflammation 3. Small right pleural effusion with partial atelectasis at the right base 4. Mild to moderate compression  deformities at T10 and T12. Electronically Signed: By: Donavan Foil M.D. On: 06/12/2017 20:33      LOS: 2 days   Tye Savoy ,NP 06/14/2017, 9:34 AM  Pager number [phone]

## 2017-06-14 NOTE — Progress Notes (Addendum)
Pt HR going in and out of different rhythms. EKGs and vital signs obtained. Lamar Blinks NP made aware. Pt is in no distress, currently lying in bed sleeping. Will continue to monitor.

## 2017-06-14 NOTE — Transfer of Care (Signed)
Immediate Anesthesia Transfer of Care Note  Patient: [name]  Procedure(s) Performed: ENDOSCOPIC RETROGRADE CHOLANGIOPANCREATOGRAPHY (ERCP) (N/A )  Patient Location: PACU  Anesthesia Type:General  Level of Consciousness: drowsy, patient cooperative and responds to stimulation  Airway & Oxygen Therapy: Patient Spontanous Breathing and Patient connected to face mask oxygen  Post-op Assessment: Report given to RN and Post -op Vital signs reviewed and stable  Post vital signs: Reviewed and stable  Last Vitals:  Vitals:   06/14/17 1200 06/14/17 1503  BP: (!) 158/70 (!) 183/68  Pulse: 67 96  Resp: 16 (!) 22  Temp: 36.8 C 36.7 C  SpO2: 97% 99%    Last Pain:  Vitals:   06/14/17 1503  TempSrc: Axillary  PainSc:          Complications: No apparent anesthesia complications

## 2017-06-14 NOTE — Interval H&P Note (Signed)
History and Physical Interval Note:  06/14/2017 12:30 PM  [name]  has presented today for surgery, with the diagnosis of choledocholithiasis  The various methods of treatment have been discussed with the patient and family. After consideration of risks, benefits and other options for treatment, the patient has consented to  Procedure(s): ENDOSCOPIC RETROGRADE CHOLANGIOPANCREATOGRAPHY (ERCP) (N/A) as a surgical intervention .  The patient's history has been reviewed, patient examined, no change in status, stable for surgery.  I have reviewed the patient's chart and labs.  Questions were answered to the patient's satisfaction.   I examined this patient and discussed ERCP with her and her daughter.  Cardiology has evaluated and started amiodarone. Ready to proceed with ERCP for choledocholithiasis and E coli bacteremia.  [name]

## 2017-06-14 NOTE — Progress Notes (Signed)
Repaged Dr. Maudie Mercury on call.

## 2017-06-14 NOTE — Anesthesia Postprocedure Evaluation (Signed)
Anesthesia Post Note  Patient: [name]  Procedure(s) Performed: ENDOSCOPIC RETROGRADE CHOLANGIOPANCREATOGRAPHY (ERCP) (N/A )     Patient location during evaluation: PACU Anesthesia Type: General Level of consciousness: awake and alert Pain management: pain level controlled Vital Signs Assessment: post-procedure vital signs reviewed and stable Respiratory status: spontaneous breathing, nonlabored ventilation, respiratory function stable and patient connected to nasal cannula oxygen Cardiovascular status: blood pressure returned to baseline and stable Postop Assessment: no apparent nausea or vomiting Anesthetic complications: no    Last Vitals:  Vitals:   06/14/17 1520 06/14/17 1545  BP: (!) 173/60 138/84  Pulse: 74 80  Resp: 17 14  Temp:  36.9 C  SpO2: 96% 92%    Last Pain:  Vitals:   06/14/17 1545  TempSrc: Oral  PainSc:                  Montez Hageman

## 2017-06-14 NOTE — H&P (View-Only) (Signed)
[facility] Progress Note   Chief Complaint:   Abnormal liver chemistries, bile duct stones    SUBJECTIVE:    no complaints.   ASSESSMENT AND PLAN:   82. 84 yo female with E.coli bacteremia , ? Cholangitis with markedly abnormal liver chemistries / suggestion of CBD stones on imaging. Liver labs slightly worse overnight. WBC normalized on antibiotics. She has had a cholecystectomy. Spoke with daughter Manuela Schwartz yesterday to get consent for procedure.  -For ERCP today at 12:30.   2. Elevated Troponin. Episodes of atrial and ventricular arrhythmias overnight. Echo yesterday - EF 60-65%, moderate AR.  -Cardiology evaluated, to start IV amiodarone  So arrhythmias won't interfere with ERCP. Appreciate Cardiology's assistance.   3. Pleural effusion, large right, small left one   OBJECTIVE:      Vital signs in last 24 hours: Temp:  [97.8 F (36.6 C)-99.7 F (37.6 C)] 99.7 F (37.6 C) (02/08 0538) Pulse Rate:  [60-128] 128 (02/08 0538) Resp:  [15-18] 18 (02/08 0538) BP: (136-172)/(43-74) 165/65 (02/08 0538) SpO2:  [95 %-96 %] 95 % (02/08 0538) Weight:  [130 lb 8.2 oz (59.2 kg)] 130 lb 8.2 oz (59.2 kg) (02/08 0538) Last BM Date: (pt is unsure) General:   Alert, well-developed, white female  in NAD EENT:  Normal hearing, non icteric sclera, conjunctive pink.  Heart:  Regular rate and rhythm; no lower extremity edema Pulm: Normal respiratory effort, bibasilar crackles. Abdomen:  Soft, nondistended, nontender.  Normal bowel sounds, no masses felt. No hepatomegaly.    Neurologic:  Alert and  oriented x4;  grossly normal neurologically. Psych:  Pleasant, cooperative.  Normal mood and affect.   Intake/Output from previous day: 02/07 0701 - 02/08 0700 In: 9449 [P.O.:240; I.V.:1165; IV Piggyback:150] Out: 400 [Urine:400] Intake/Output this shift: Total I/O In: -  Out: 600 [Urine:600]  Lab Results: Recent Labs    06/12/17 1723 06/13/17 1110 06/14/17 0421    WBC 13.9* 7.9 8.2  HGB 12.9 11.6* 12.9  HCT 39.0 35.7* 39.5  PLT 248 245 285   BMET Recent Labs    06/12/17 1723 06/13/17 1110 06/14/17 0421  NA 138 138 140  K 4.0 3.0* 3.4*  CL 100* 100* 103  CO2 28 29 28   GLUCOSE 182* 142* 148*  BUN 26* 23* 16  CREATININE 0.94 0.99 0.89  CALCIUM 9.3 8.6* 8.3*   LFT Recent Labs    06/14/17 0421  PROT 6.5  ALBUMIN 3.0*  AST 720*  ALT 679*  ALKPHOS 605*  BILITOT 1.3*   PT/INR Recent Labs    06/13/17 1301  LABPROT 14.6  INR 1.15   Hepatitis Panel Recent Labs    06/12/17 1914  HEPBSAG Negative  HCVAB <0.1  HEPAIGM Negative  HEPBIGM Negative    Dg Chest 2 View  Result Date: 06/12/2017 CLINICAL DATA:  Fever EXAM: CHEST  2 VIEW COMPARISON:  06/12/2017, 09/10/2016 FINDINGS: Negative for heart failure. Atherosclerotic calcification aortic arch. Mild right pleural effusion with mild right lower lobe atelectasis. No definite pneumonia. Multiple mild chronic compression fractures in the thoracic spine with kyphosis. Advanced degenerative change left shoulder joint IMPRESSION: Mild right lower lobe atelectasis and small right effusion. No definite pneumonia. Electronically Signed   By: Franchot Gallo M.D.   On: 06/12/2017 20:35   Ct Head Wo Contrast  Result Date: 06/12/2017 CLINICAL DATA:  Dementia EXAM: CT HEAD WITHOUT CONTRAST TECHNIQUE: Contiguous axial images were obtained from the base of the skull through the vertex without intravenous contrast. COMPARISON:  10/07/2011 FINDINGS: Brain: Moderate atrophy, with progression since 2013. Moderate chronic microvascular ischemic change in the white matter, with progression. Hypodensity left thalamus consistent with chronic infarct. Negative for acute hemorrhage or mass.  No midline shift Vascular: Negative for vascular thrombosis Skull: Negative Sinuses/Orbits: Negative Other: None IMPRESSION: Progression of atrophy and chronic microvascular ischemia since 2013. No acute abnormality.  Electronically Signed   By: Franchot Gallo M.D.   On: 06/12/2017 20:40   Mr Thoracic Spine Wo Contrast  Result Date: 06/13/2017 CLINICAL DATA:  Thoracic spine fracture EXAM: MRI THORACIC SPINE WITHOUT CONTRAST TECHNIQUE: Multiplanar, multisequence MR imaging of the thoracic spine was performed. No intravenous contrast was administered. COMPARISON:  Chest radiograph 06/12/2017 FINDINGS: Alignment:  Increased thoracic kyphosis.  No static subluxation. Vertebrae: There is moderate height loss T9, T10 and T12 with depression of the superior endplates but no marrow edema. These findings were present on prior studies. Cord:  Normal signal and morphology. Paraspinal and other soft tissues: Large right and trace left pleural effusions. Disc levels: No large disc herniation.  No spinal canal stenosis. IMPRESSION: 1. Multilevel chronic vertebral body height loss, particularly at the T9, T10 and T12 levels. This is unchanged compared to multiple old studies. No acute compression fracture. 2. No thoracic spinal canal stenosis or cord abnormality. 3. Large right and small left pleural effusions Electronically Signed   By: Ulyses Jarred M.D.   On: 06/13/2017 20:10   Ct Abdomen Pelvis W Contrast  Addendum Date: 06/13/2017   ADDENDUM REPORT: 06/13/2017 09:21 ADDENDUM: At least 2 stones in the distal common bile duct with prominent common bile duct wall enhancement. Consider cholangitis as cause of symptoms. These findings were discussed with Dr. Maudie Mercury. The 2 small low densities in the right liver could reflect developing biloma/abscess. No drainable collection. Electronically Signed   By: Monte Fantasia M.D.   On: 06/13/2017 09:21   Result Date: 06/13/2017 CLINICAL DATA:  Dementia generalize weakness fever positive weight loss abnormal liver function EXAM: CT ABDOMEN AND PELVIS WITH CONTRAST TECHNIQUE: Multidetector CT imaging of the abdomen and pelvis was performed using the standard protocol following bolus administration  of intravenous contrast. CONTRAST:  149mL ISOVUE-300 IOPAMIDOL (ISOVUE-300) INJECTION 61% COMPARISON:  Radiograph 09/04/2016, CT chest 07/15/2008 FINDINGS: Lower chest: Small right-sided pleural effusion with partial atelectasis at the right base. The left lung base is clear. Borderline heart size. Coronary vascular calcification. Hepatobiliary: Heterogeneous liver parenchyma with vague hypoenhancing foci in the right lobe, for example series 2, image number 18 measuring 3 mm, and within the dome of the liver measuring approximately 1.9 cm. Surgical clips in the gallbladder fossa. No biliary dilatation. Pancreas: Unremarkable. No pancreatic ductal dilatation or surrounding inflammatory changes. Spleen: Normal in size without focal abnormality. Adrenals/Urinary Tract: Adrenal glands are within normal limits. Diffuse cortical thinning of the kidneys. No hydronephrosis. The bladder is unremarkable Stomach/Bowel: Stomach is nonenlarged. No dilated small bowel. No colon wall thickening. Sigmoid colon diverticular disease. Vascular/Lymphatic: Extensive aortic atherosclerosis without aneurysmal dilatation. No significantly enlarged lymph nodes. Reproductive: Status post hysterectomy. No adnexal masses. Other: Negative for free air or free fluid. Musculoskeletal: Degenerative changes of the spine. Trace retrolisthesis of L1 on L2. Mild to moderate compression deformities at T12 and T10. IMPRESSION: 1. Coarse heterogeneous appearance of the liver with diffuse decreased density suggesting steatosis or hepatocellular disease. There are vague indeterminate hypoenhancing lesions within the dome of the liver and right lobe, for which nonemergent liver MRI could be helpful to further evaluate. There is no  biliary dilatation in this post cholecystectomy patient. 2. Sigmoid colon diverticular disease without acute inflammation 3. Small right pleural effusion with partial atelectasis at the right base 4. Mild to moderate compression  deformities at T10 and T12. Electronically Signed: By: Donavan Foil M.D. On: 06/12/2017 20:33      LOS: 2 days   Tye Savoy ,NP 06/14/2017, 9:34 AM  Pager number [phone]

## 2017-06-14 NOTE — Consult Note (Signed)
CARDIOLOGY CONSULT NOTE       Patient ID: HEYLI MIN MRN: [id number] DOB/AGE: [birth year] 84 y.o.  Admit date: 06/12/2017 Referring Physician: Maudie Mercury Primary Physician: [name], MD Primary Cardiologist: None Reason for Consultation: Elevated troponin Arrhythmia   Active Problems:   Fever   HPI:  84 y.o. DNR with severe dementia, HTN, HLD Depression admitted with fever weakness No chest pain Or palpitations LFT;s elevated troponin .08 and telemetry with bursts of atrial and ventricular arrhythmia ? Some PAF. Totally asymptomatic Echo reviewed from yesterday and EF 60-65% moderate AR No history of CHF or MI. Given LFTls statin stopped Not taking much PO CT with 2 stones in CBD consistent with cholangitis ? For  ERCP today. Patient can offer little history   ROS All other systems reviewed and negative except as noted above  Past Medical History:  Diagnosis Date  . Aortic regurgitation   . Atrial fibrillation ([place])   . Dementia   . Depression   . Gallstone pancreatitis   . Hyperlipidemia   . Hypertension   . Ovarian cancer ([place])     Family History  Problem Relation Age of Onset  . COPD Father     Social History   Socioeconomic History  . Marital status: Widowed    Spouse name: Not on file  . Number of children: Not on file  . Years of education: Not on file  . Highest education level: Not on file  Social Needs  . Financial resource strain: Not on file  . Food insecurity - worry: Not on file  . Food insecurity - inability: Not on file  . Transportation needs - medical: Not on file  . Transportation needs - non-medical: Not on file  Occupational History  . Not on file  Tobacco Use  . Smoking status: Never Smoker  . Smokeless tobacco: Never Used  Substance and Sexual Activity  . Alcohol use: No    Alcohol/week: 0.0 oz  . Drug use: No  . Sexual activity: No  Other Topics Concern  . Not on file  Social History Narrative  . Not on file    Past  Surgical History:  Procedure Laterality Date  . ABDOMINAL HYSTERECTOMY       . lactose free nutrition  237 mL Oral BID BM  . metoprolol tartrate  2.5 mg Intravenous Q6H   . sodium chloride 75 mL/hr at 06/14/17 0712  . piperacillin-tazobactam (ZOSYN)  IV 3.375 g (06/14/17 0546)  . potassium chloride      Physical Exam: Blood pressure (!) 165/65, pulse (!) 128, temperature 99.7 F (37.6 C), temperature source Oral, resp. rate 18, height 5\' 7"  (1.702 m), weight 130 lb 8.2 oz (59.2 kg), SpO2 95 %.   Demented  Frail thin white female  HEENT: normal Neck supple with no adenopathy JVP normal no bruits no thyromegaly Lungs clear with no wheezing and good diaphragmatic motion Heart:  S1/S2 AR murmur, no rub, gallop or click PMI normal Abdomen not distended  no bruit.  No HSM or HJR Distal pulses intact with no bruits No edema Neuro non-focal Skin warm and dry No muscular weakness   Labs:   Lab Results  Component Value Date   WBC 8.2 06/14/2017   HGB 12.9 06/14/2017   HCT 39.5 06/14/2017   MCV 90.2 06/14/2017   PLT 285 06/14/2017    Recent Labs  Lab 06/14/17 0421  NA 140  K 3.4*  CL 103  CO2 28  BUN 16  CREATININE 0.89  CALCIUM 8.3*  PROT 6.5  BILITOT 1.3*  ALKPHOS 605*  ALT 679*  AST 720*  GLUCOSE 148*   Lab Results  Component Value Date   CKTOTAL <5 (L) 06/12/2017   CKMB 8.6 (H) 06/12/2017   TROPONINI 0.06 (HH) 06/14/2017    Lab Results  Component Value Date   CHOL 162 07/04/2007   Lab Results  Component Value Date   HDL 55.5 07/04/2007   Lab Results  Component Value Date   LDLCALC 89 07/04/2007   Lab Results  Component Value Date   TRIG 90 07/04/2007   Lab Results  Component Value Date   CHOLHDL 2.9 CALC 07/04/2007   No results found for: LDLDIRECT    Radiology: Dg Chest 2 View  Result Date: 06/12/2017 CLINICAL DATA:  Fever EXAM: CHEST  2 VIEW COMPARISON:  06/12/2017, 09/10/2016 FINDINGS: Negative for heart failure. Atherosclerotic  calcification aortic arch. Mild right pleural effusion with mild right lower lobe atelectasis. No definite pneumonia. Multiple mild chronic compression fractures in the thoracic spine with kyphosis. Advanced degenerative change left shoulder joint IMPRESSION: Mild right lower lobe atelectasis and small right effusion. No definite pneumonia. Electronically Signed   By: Franchot Gallo M.D.   On: 06/12/2017 20:35   Ct Head Wo Contrast  Result Date: 06/12/2017 CLINICAL DATA:  Dementia EXAM: CT HEAD WITHOUT CONTRAST TECHNIQUE: Contiguous axial images were obtained from the base of the skull through the vertex without intravenous contrast. COMPARISON:  10/07/2011 FINDINGS: Brain: Moderate atrophy, with progression since 2013. Moderate chronic microvascular ischemic change in the white matter, with progression. Hypodensity left thalamus consistent with chronic infarct. Negative for acute hemorrhage or mass.  No midline shift Vascular: Negative for vascular thrombosis Skull: Negative Sinuses/Orbits: Negative Other: None IMPRESSION: Progression of atrophy and chronic microvascular ischemia since 2013. No acute abnormality. Electronically Signed   By: Franchot Gallo M.D.   On: 06/12/2017 20:40   Mr Thoracic Spine Wo Contrast  Result Date: 06/13/2017 CLINICAL DATA:  Thoracic spine fracture EXAM: MRI THORACIC SPINE WITHOUT CONTRAST TECHNIQUE: Multiplanar, multisequence MR imaging of the thoracic spine was performed. No intravenous contrast was administered. COMPARISON:  Chest radiograph 06/12/2017 FINDINGS: Alignment:  Increased thoracic kyphosis.  No static subluxation. Vertebrae: There is moderate height loss T9, T10 and T12 with depression of the superior endplates but no marrow edema. These findings were present on prior studies. Cord:  Normal signal and morphology. Paraspinal and other soft tissues: Large right and trace left pleural effusions. Disc levels: No large disc herniation.  No spinal canal stenosis.  IMPRESSION: 1. Multilevel chronic vertebral body height loss, particularly at the T9, T10 and T12 levels. This is unchanged compared to multiple old studies. No acute compression fracture. 2. No thoracic spinal canal stenosis or cord abnormality. 3. Large right and small left pleural effusions Electronically Signed   By: Ulyses Jarred M.D.   On: 06/13/2017 20:10   Ct Abdomen Pelvis W Contrast  Addendum Date: 06/13/2017   ADDENDUM REPORT: 06/13/2017 09:21 ADDENDUM: At least 2 stones in the distal common bile duct with prominent common bile duct wall enhancement. Consider cholangitis as cause of symptoms. These findings were discussed with Dr. Maudie Mercury. The 2 small low densities in the right liver could reflect developing biloma/abscess. No drainable collection. Electronically Signed   By: Monte Fantasia M.D.   On: 06/13/2017 09:21   Result Date: 06/13/2017 CLINICAL DATA:  Dementia generalize weakness fever positive weight loss abnormal liver function EXAM: CT ABDOMEN AND PELVIS  WITH CONTRAST TECHNIQUE: Multidetector CT imaging of the abdomen and pelvis was performed using the standard protocol following bolus administration of intravenous contrast. CONTRAST:  169mL ISOVUE-300 IOPAMIDOL (ISOVUE-300) INJECTION 61% COMPARISON:  Radiograph 09/04/2016, CT chest 07/15/2008 FINDINGS: Lower chest: Small right-sided pleural effusion with partial atelectasis at the right base. The left lung base is clear. Borderline heart size. Coronary vascular calcification. Hepatobiliary: Heterogeneous liver parenchyma with vague hypoenhancing foci in the right lobe, for example series 2, image number 18 measuring 3 mm, and within the dome of the liver measuring approximately 1.9 cm. Surgical clips in the gallbladder fossa. No biliary dilatation. Pancreas: Unremarkable. No pancreatic ductal dilatation or surrounding inflammatory changes. Spleen: Normal in size without focal abnormality. Adrenals/Urinary Tract: Adrenal glands are within  normal limits. Diffuse cortical thinning of the kidneys. No hydronephrosis. The bladder is unremarkable Stomach/Bowel: Stomach is nonenlarged. No dilated small bowel. No colon wall thickening. Sigmoid colon diverticular disease. Vascular/Lymphatic: Extensive aortic atherosclerosis without aneurysmal dilatation. No significantly enlarged lymph nodes. Reproductive: Status post hysterectomy. No adnexal masses. Other: Negative for free air or free fluid. Musculoskeletal: Degenerative changes of the spine. Trace retrolisthesis of L1 on L2. Mild to moderate compression deformities at T12 and T10. IMPRESSION: 1. Coarse heterogeneous appearance of the liver with diffuse decreased density suggesting steatosis or hepatocellular disease. There are vague indeterminate hypoenhancing lesions within the dome of the liver and right lobe, for which nonemergent liver MRI could be helpful to further evaluate. There is no biliary dilatation in this post cholecystectomy patient. 2. Sigmoid colon diverticular disease without acute inflammation 3. Small right pleural effusion with partial atelectasis at the right base 4. Mild to moderate compression deformities at T10 and T12. Electronically Signed: By: Donavan Foil M.D. On: 06/12/2017 20:33    EKG: Sinus multiform PVC;s  Telemetry with same and ? PAF    ASSESSMENT AND PLAN:  Troponin :  .08 not significant what som ever in 84 yo demented female with normal EF by echo Arrhythmia:  Start iv amiodarone so GI does not worry as much about arrhythmia during procedure GI:  Cholangitis with CBD stones for ERCP ok to proceed  Signed: Jenkins Rouge 06/14/2017, 8:41 AM

## 2017-06-14 NOTE — Progress Notes (Signed)
Dr. Maudie Mercury aware of pts HR. New orders received. Will continue to monitor pt closely.

## 2017-06-14 NOTE — Progress Notes (Addendum)
Patient ID: [name], female   DOB: 1918/12/14, 84 y.o.   MRN: [id number]                                                                PROGRESS NOTE                                                                                                                                                                                                             Patient Demographics:    [name], is a 84 y.o. female, DOB - December 09, 1918, [id number]  Admit date - 06/12/2017   Admitting Physician Jani Gravel, MD  Outpatient Primary MD for the patient is [name], MD  LOS - 2  Outpatient Specialists:    No chief complaint on file. weakness     Brief Narrative   84 y.o.female,w dementia, hypertension, hyperlipidemia, depression, ? Circulation issues that apparently presents with c/o generalized weakness for the past 1 week. Subjective fever. Pt denies cough, cp, palp, sob, n/v, abd pain, diarrhea, brbpr, black stool. + weight loss.   Pt has trop 0.08, and markedly abnormal liver function. Pt denies new medication. Pt denies any risk for viral hepatitis.     Subjective:    [name] today has no compliants.  Afib with RVR overnite.  Seemed to resolve pretty much by itself.  Started on iv metoprolol 2.5mg  iv q6h since NPO for procedure ERCP.  No complaints of chest pain , palp, sob, n/v, diarrhea, brbpr.  History limited by her dementia.     Assessment  & Plan :    Active Problems:   Fever     Abnormal liver function stable cmp in am pt has cholelithiasis, ERCP today Appreciate GI  input  ? Fever prior to admission Blood culture + E. Coli.  Aztreonam=> Zosyn pharmacy to dose  Troponin elevation (EF 60-65%) Chronic, stable Cardiac echo 06/13/2017=> EF 60-65%, moderate AR, mod pulm hypertension  Afib with RVR (EF 60-65%)  New overnite (Chads2vasc=4) Trop I q6h x3 Metoprolol 2.5mg  iv q6h (resting hr 65-75) Cardiology consult requested to see if they  have any further suggestions regarding trop elevation, , afib Defer to cardiology regarding anticoagulation  Hypertension Hydralazine 5mg  iv q6h prn sbp >160  Compression fracture T10, T12 MRI T spine (stable, old  fracture) Check vitamin D, tsh in am   Code Status :    DNR  Family Communication  : spoke with daughter yesterday and attempted contact today to update, no response  Disposition Plan  :     ALF, [facility]  Barriers For Discharge :   Consults  :  GI, cardiology  Procedures  :    DVT Prophylaxis  :  Lovenox (restart lovenox tonight)- SCDs    Antibiotics  :  aztreonam2/7, zosyn 2/7=>  Anti-infectives (From admission, onward)   Start     Dose/Rate Route Frequency Ordered Stop   06/13/17 1400  piperacillin-tazobactam (ZOSYN) IVPB 3.375 g     3.375 g 12.5 mL/hr over 240 Minutes Intravenous Every 8 hours 06/13/17 1251     06/13/17 1000  aztreonam (AZACTAM) 1 GM IVPB  Status:  Discontinued     1 g 100 mL/hr over 30 Minutes Intravenous Every 8 hours 06/13/17 0900 06/13/17 1223   06/13/17 0830  aztreonam (AZACTAM) 1 g in dextrose 5 % 50 mL IVPB  Status:  Discontinued     1 g 100 mL/hr over 30 Minutes Intravenous Every 8 hours 06/13/17 0823 06/13/17 0900        Objective:   Vitals:   06/13/17 1510 06/13/17 2242 06/14/17 0258 06/14/17 0538  BP: (!) 147/55 (!) 145/62 (!) 172/74 (!) 165/65  Pulse: 64 66  (!) 128  Resp: 15 16  18   Temp: 97.8 F (36.6 C) 98 F (36.7 C)  99.7 F (37.6 C)  TempSrc: Oral Oral  Oral  SpO2: 95% 96%  95%  Weight:    59.2 kg (130 lb 8.2 oz)  Height:        Wt Readings from Last 3 Encounters:  06/14/17 59.2 kg (130 lb 8.2 oz)     Intake/Output Summary (Last 24 hours) at 06/14/2017 0801 Last data filed at 06/14/2017 0600 Gross per 24 hour  Intake 1555 ml  Output 400 ml  Net 1155 ml     Physical Exam  Awake Alert, Oriented X 3, No new F.N deficits, Normal affect [place].AT,PERRAL Supple Neck,No JVD, No cervical  lymphadenopathy appriciated.  Symmetrical Chest wall movement, Good air movement bilaterally, CTAB Irr, irr, s1, s2, 1/6 dm rusb  +ve B.Sounds, Abd Soft, No tenderness, No organomegaly appriciated, No rebound - guarding or rigidity. No Cyanosis, Clubbing or edema, No new Rash or bruise   Varicose veins    Data Review:    CBC Recent Labs  Lab 06/12/17 1723 06/13/17 1110 06/14/17 0421  WBC 13.9* 7.9 8.2  HGB 12.9 11.6* 12.9  HCT 39.0 35.7* 39.5  PLT 248 245 285  MCV 89.4 90.2 90.2  MCH 29.6 29.3 29.5  MCHC 33.1 32.5 32.7  RDW 14.1 14.1 14.4    Chemistries  Recent Labs  Lab 06/12/17 1723 06/13/17 1110 06/14/17 0421  NA 138 138 140  K 4.0 3.0* 3.4*  CL 100* 100* 103  CO2 28 29 28   GLUCOSE 182* 142* 148*  BUN 26* 23* 16  CREATININE 0.94 0.99 0.89  CALCIUM 9.3 8.6* 8.3*  AST 812* 315* 720*  ALT 713* 479* 679*  ALKPHOS 735* 538* 605*  BILITOT 2.7* 1.1 1.3*   ------------------------------------------------------------------------------------------------------------------ No results for input(s): CHOL, HDL, LDLCALC, TRIG, CHOLHDL, LDLDIRECT in the last 72 hours.  Lab Results  Component Value Date   HGBA1C 6.0 (H) 06/12/2017   ------------------------------------------------------------------------------------------------------------------ No results for input(s): TSH, T4TOTAL, T3FREE, THYROIDAB in the last 72 hours.  Invalid input(s): FREET3 ------------------------------------------------------------------------------------------------------------------  No results for input(s): VITAMINB12, FOLATE, FERRITIN, TIBC, IRON, RETICCTPCT in the last 72 hours.  Coagulation profile Recent Labs  Lab 06/13/17 1301  INR 1.15    No results for input(s): DDIMER in the last 72 hours.  Cardiac Enzymes Recent Labs  Lab 06/12/17 1723  06/13/17 0435 06/13/17 1110 06/14/17 0421  CKMB 8.6*  --   --   --   --   TROPONINI 0.08*   < > 0.08* 0.07* 0.06*   < > = values in  this interval not displayed.   ------------------------------------------------------------------------------------------------------------------ No results found for: BNP  Inpatient Medications  Scheduled Meds: . lactose free nutrition  237 mL Oral BID BM  . metoprolol tartrate  2.5 mg Intravenous Q6H   Continuous Infusions: . sodium chloride 75 mL/hr at 06/14/17 0712  . piperacillin-tazobactam (ZOSYN)  IV 3.375 g (06/14/17 0546)  . potassium chloride     PRN Meds:.acetaminophen **OR** acetaminophen, hydrALAZINE, iopamidol  Micro Results Recent Results (from the past 240 hour(s))  Culture, blood (routine x 2)     Status: None (Preliminary result)   Collection Time: 06/12/17  5:23 PM  Result Value Ref Range Status   Specimen Description   Final    BLOOD BLOOD LEFT ARM Performed at [facility] [address] [place], [place] [address]    Special Requests   Final    BOTTLES DRAWN AEROBIC AND ANAEROBIC Blood Culture adequate volume Performed at [facility] [address], [place], [place] [address]    Culture  Setup Time   Final    GRAM NEGATIVE RODS IN BOTH AEROBIC AND ANAEROBIC BOTTLES Organism ID to follow CRITICAL RESULT CALLED TO, READ BACK BY AND VERIFIED WITH: Melodye Ped PHARMD, AT [place] 06/13/17 BY Rush Landmark Performed at [facility], [place] [address] [place], [place] [address]    Culture GRAM NEGATIVE RODS  Final   Report Status PENDING  Incomplete  Blood Culture ID Panel (Reflexed)     Status: Abnormal   Collection Time: 06/12/17  5:23 PM  Result Value Ref Range Status   Enterococcus species NOT DETECTED NOT DETECTED Final   Vancomycin resistance NOT DETECTED NOT DETECTED Final   Listeria monocytogenes NOT DETECTED NOT DETECTED Final   Staphylococcus species NOT DETECTED NOT DETECTED Final   Staphylococcus aureus NOT DETECTED NOT DETECTED Final   Methicillin resistance NOT DETECTED NOT DETECTED Final   Streptococcus  species NOT DETECTED NOT DETECTED Final   Streptococcus agalactiae NOT DETECTED NOT DETECTED Final   Streptococcus pneumoniae NOT DETECTED NOT DETECTED Final   Streptococcus pyogenes NOT DETECTED NOT DETECTED Final   Acinetobacter baumannii NOT DETECTED NOT DETECTED Final   Enterobacteriaceae species DETECTED (A) NOT DETECTED Final    Comment: CRITICAL RESULT CALLED TO, READ BACK BY AND VERIFIED WITH: J. GADHIA PHARMD, AT 0801 06/13/17 BY D. VANHOOK Enterobacteriaceae represent a large family of gram-negative bacteria, not a single organism.    Enterobacter cloacae complex NOT DETECTED NOT DETECTED Final   Escherichia coli DETECTED (A) NOT DETECTED Final    Comment: CRITICAL RESULT CALLED TO, READ BACK BY AND VERIFIED WITH: J. GADHIA PHARMD, AT 0801 06/13/17 BY D. VANHOOK    Klebsiella oxytoca NOT DETECTED NOT DETECTED Final   Klebsiella pneumoniae NOT DETECTED NOT DETECTED Final   Proteus species NOT DETECTED NOT DETECTED Final   Serratia marcescens NOT DETECTED NOT DETECTED Final   Carbapenem resistance NOT DETECTED NOT DETECTED Final   Haemophilus influenzae NOT DETECTED NOT  DETECTED Final   Neisseria meningitidis NOT DETECTED NOT DETECTED Final   Pseudomonas aeruginosa NOT DETECTED NOT DETECTED Final   Candida albicans NOT DETECTED NOT DETECTED Final   Candida glabrata NOT DETECTED NOT DETECTED Final   Candida krusei NOT DETECTED NOT DETECTED Final   Candida parapsilosis NOT DETECTED NOT DETECTED Final   Candida tropicalis NOT DETECTED NOT DETECTED Final    Comment: Performed at [facility], [place] [address] [place], [place] [address]  Culture, blood (routine x 2)     Status: None (Preliminary result)   Collection Time: 06/12/17  5:32 PM  Result Value Ref Range Status   Specimen Description   Final    BLOOD BLOOD RIGHT HAND Performed at [facility] [address] [place], [place] [address]    Special Requests   Final    BOTTLES DRAWN AEROBIC AND  ANAEROBIC Blood Culture adequate volume Performed at [facility] [address], [place], [place] [address]    Culture  Setup Time   Final    GRAM NEGATIVE RODS IN BOTH AEROBIC AND ANAEROBIC BOTTLES CRITICAL VALUE NOTED.  VALUE IS CONSISTENT WITH PREVIOUSLY REPORTED AND CALLED VALUE. Performed at [facility], [place] [address], [place], [place] [address]    Culture GRAM NEGATIVE RODS  Final   Report Status PENDING  Incomplete    Radiology Reports Dg Chest 2 View  Result Date: 06/12/2017 CLINICAL DATA:  Fever EXAM: CHEST  2 VIEW COMPARISON:  06/12/2017, 09/10/2016 FINDINGS: Negative for heart failure. Atherosclerotic calcification aortic arch. Mild right pleural effusion with mild right lower lobe atelectasis. No definite pneumonia. Multiple mild chronic compression fractures in the thoracic spine with kyphosis. Advanced degenerative change left shoulder joint IMPRESSION: Mild right lower lobe atelectasis and small right effusion. No definite pneumonia. Electronically Signed   By: Franchot Gallo M.D.   On: 06/12/2017 20:35   Ct Head Wo Contrast  Result Date: 06/12/2017 CLINICAL DATA:  Dementia EXAM: CT HEAD WITHOUT CONTRAST TECHNIQUE: Contiguous axial images were obtained from the base of the skull through the vertex without intravenous contrast. COMPARISON:  10/07/2011 FINDINGS: Brain: Moderate atrophy, with progression since 2013. Moderate chronic microvascular ischemic change in the white matter, with progression. Hypodensity left thalamus consistent with chronic infarct. Negative for acute hemorrhage or mass.  No midline shift Vascular: Negative for vascular thrombosis Skull: Negative Sinuses/Orbits: Negative Other: None IMPRESSION: Progression of atrophy and chronic microvascular ischemia since 2013. No acute abnormality. Electronically Signed   By: Franchot Gallo M.D.   On: 06/12/2017 20:40   Mr Thoracic Spine Wo Contrast  Result Date: 06/13/2017 CLINICAL DATA:   Thoracic spine fracture EXAM: MRI THORACIC SPINE WITHOUT CONTRAST TECHNIQUE: Multiplanar, multisequence MR imaging of the thoracic spine was performed. No intravenous contrast was administered. COMPARISON:  Chest radiograph 06/12/2017 FINDINGS: Alignment:  Increased thoracic kyphosis.  No static subluxation. Vertebrae: There is moderate height loss T9, T10 and T12 with depression of the superior endplates but no marrow edema. These findings were present on prior studies. Cord:  Normal signal and morphology. Paraspinal and other soft tissues: Large right and trace left pleural effusions. Disc levels: No large disc herniation.  No spinal canal stenosis. IMPRESSION: 1. Multilevel chronic vertebral body height loss, particularly at the T9, T10 and T12 levels. This is unchanged compared to multiple old studies. No acute compression fracture. 2. No thoracic spinal canal stenosis or cord abnormality. 3. Large right and small left pleural effusions Electronically Signed  By: Ulyses Jarred M.D.   On: 06/13/2017 20:10   Ct Abdomen Pelvis W Contrast  Addendum Date: 06/13/2017   ADDENDUM REPORT: 06/13/2017 09:21 ADDENDUM: At least 2 stones in the distal common bile duct with prominent common bile duct wall enhancement. Consider cholangitis as cause of symptoms. These findings were discussed with Dr. Maudie Mercury. The 2 small low densities in the right liver could reflect developing biloma/abscess. No drainable collection. Electronically Signed   By: Monte Fantasia M.D.   On: 06/13/2017 09:21   Result Date: 06/13/2017 CLINICAL DATA:  Dementia generalize weakness fever positive weight loss abnormal liver function EXAM: CT ABDOMEN AND PELVIS WITH CONTRAST TECHNIQUE: Multidetector CT imaging of the abdomen and pelvis was performed using the standard protocol following bolus administration of intravenous contrast. CONTRAST:  127mL ISOVUE-300 IOPAMIDOL (ISOVUE-300) INJECTION 61% COMPARISON:  Radiograph 09/04/2016, CT chest 07/15/2008  FINDINGS: Lower chest: Small right-sided pleural effusion with partial atelectasis at the right base. The left lung base is clear. Borderline heart size. Coronary vascular calcification. Hepatobiliary: Heterogeneous liver parenchyma with vague hypoenhancing foci in the right lobe, for example series 2, image number 18 measuring 3 mm, and within the dome of the liver measuring approximately 1.9 cm. Surgical clips in the gallbladder fossa. No biliary dilatation. Pancreas: Unremarkable. No pancreatic ductal dilatation or surrounding inflammatory changes. Spleen: Normal in size without focal abnormality. Adrenals/Urinary Tract: Adrenal glands are within normal limits. Diffuse cortical thinning of the kidneys. No hydronephrosis. The bladder is unremarkable Stomach/Bowel: Stomach is nonenlarged. No dilated small bowel. No colon wall thickening. Sigmoid colon diverticular disease. Vascular/Lymphatic: Extensive aortic atherosclerosis without aneurysmal dilatation. No significantly enlarged lymph nodes. Reproductive: Status post hysterectomy. No adnexal masses. Other: Negative for free air or free fluid. Musculoskeletal: Degenerative changes of the spine. Trace retrolisthesis of L1 on L2. Mild to moderate compression deformities at T12 and T10. IMPRESSION: 1. Coarse heterogeneous appearance of the liver with diffuse decreased density suggesting steatosis or hepatocellular disease. There are vague indeterminate hypoenhancing lesions within the dome of the liver and right lobe, for which nonemergent liver MRI could be helpful to further evaluate. There is no biliary dilatation in this post cholecystectomy patient. 2. Sigmoid colon diverticular disease without acute inflammation 3. Small right pleural effusion with partial atelectasis at the right base 4. Mild to moderate compression deformities at T10 and T12. Electronically Signed: By: Donavan Foil M.D. On: 06/12/2017 20:33    Time Spent in minutes  30   Jani Gravel M.D  on 06/14/2017 at 8:01 AM  Between 7am to 7am - Pager - [phone]  (cell phone, can just call)

## 2017-06-14 NOTE — Consult Note (Signed)
  Amiodarone Drug - Drug Interaction Consult Note  Recommendations: Continue with amiodarone Monitor for bradycardia with concomitant beta-blocker QTc noted to be borderline long (478 msec on 2/8)  Avoid prescribing any drugs that prolong the QT interval (fluoroquinolones, macrolides, scheduled zofran)   Amiodarone is metabolized by the cytochrome P450 system and therefore has the potential to cause many drug interactions. Amiodarone has an average plasma half-life of 50 days (range 20 to 100 days).   There is potential for drug interactions to occur several weeks or months after stopping treatment and the onset of drug interactions may be slow after initiating amiodarone.   []  Statins: Increased risk of myopathy. Simvastatin- restrict dose to 20mg  daily. Other statins: counsel patients to report any muscle pain or weakness immediately.  []  Anticoagulants: Amiodarone can increase anticoagulant effect. Consider warfarin dose reduction. Patients should be monitored closely and the dose of anticoagulant altered accordingly, remembering that amiodarone levels take several weeks to stabilize.  []  Antiepileptics: Amiodarone can increase plasma concentration of phenytoin, the dose should be reduced. Note that small changes in phenytoin dose can result in large changes in levels. Monitor patient and counsel on signs of toxicity.  [x]  Beta blockers: increased risk of bradycardia, AV block and myocardial depression. Sotalol - avoid concomitant use.  []   Calcium channel blockers (diltiazem and verapamil): increased risk of bradycardia, AV block and myocardial depression.  []   Cyclosporine: Amiodarone increases levels of cyclosporine. Reduced dose of cyclosporine is recommended.  []  Digoxin dose should be halved when amiodarone is started.  []  Diuretics: increased risk of cardiotoxicity if hypokalemia occurs.  []  Oral hypoglycemic agents (glyburide, glipizide, glimepiride): increased risk of  hypoglycemia. Patient's glucose levels should be monitored closely when initiating amiodarone therapy.   []  Drugs that prolong the QT interval:  Torsades de pointes risk may be increased with concurrent use - avoid if possible.  Monitor QTc, also keep magnesium/potassium WNL if concurrent therapy can't be avoided. Marland Kitchen Antibiotics: e.g. fluoroquinolones, erythromycin. . Antiarrhythmics: e.g. quinidine, procainamide, disopyramide, sotalol. . Antipsychotics: e.g. phenothiazines, haloperidol.  . Lithium, tricyclic antidepressants, and methadone.  Thank You,  Iline Oven  06/14/2017 11:11 AM

## 2017-06-14 NOTE — Anesthesia Preprocedure Evaluation (Addendum)
Anesthesia Evaluation  Patient identified by MRN, date of birth, ID band Patient awake    Reviewed: Allergy & Precautions, NPO status , Patient's Chart, lab work & pertinent test results  Airway Mallampati: II  TM Distance: >3 FB Neck ROM: Full    Dental no notable dental hx.    Pulmonary neg pulmonary ROS,    Pulmonary exam normal breath sounds clear to auscultation       Cardiovascular hypertension, Pt. on medications Normal cardiovascular exam+ dysrhythmias Atrial Fibrillation + Valvular Problems/Murmurs AI  Rhythm:Regular Rate:Normal     Neuro/Psych negative neurological ROS  negative psych ROS   GI/Hepatic negative GI ROS, Neg liver ROS,   Endo/Other  negative endocrine ROS  Renal/GU negative Renal ROS  negative genitourinary   Musculoskeletal negative musculoskeletal ROS (+)   Abdominal   Peds negative pediatric ROS (+)  Hematology negative hematology ROS (+)   Anesthesia Other Findings   Reproductive/Obstetrics negative OB ROS                             Anesthesia Physical Anesthesia Plan  ASA: III  Anesthesia Plan: General   Post-op Pain Management:    Induction: Intravenous  PONV Risk Score and Plan: 3 and Ondansetron and Treatment may vary due to age or medical condition  Airway Management Planned: Oral ETT  Additional Equipment:   Intra-op Plan:   Post-operative Plan: Extubation in OR  Informed Consent: I have reviewed the patients History and Physical, chart, labs and discussed the procedure including the risks, benefits and alternatives for the proposed anesthesia with the patient or authorized representative who has indicated his/her understanding and acceptance.   Dental advisory given  Plan Discussed with: CRNA  Anesthesia Plan Comments:         Anesthesia Quick Evaluation

## 2017-06-15 DIAGNOSIS — I351 Nonrheumatic aortic (valve) insufficiency: Secondary | ICD-10-CM

## 2017-06-15 LAB — CBC
HCT: 34 % — ABNORMAL LOW (ref 36.0–46.0)
HEMOGLOBIN: 11.2 g/dL — AB (ref 12.0–15.0)
MCH: 29.5 pg (ref 26.0–34.0)
MCHC: 32.9 g/dL (ref 30.0–36.0)
MCV: 89.5 fL (ref 78.0–100.0)
PLATELETS: 254 10*3/uL (ref 150–400)
RBC: 3.8 MIL/uL — ABNORMAL LOW (ref 3.87–5.11)
RDW: 14.8 % (ref 11.5–15.5)
WBC: 5.7 10*3/uL (ref 4.0–10.5)

## 2017-06-15 LAB — COMPREHENSIVE METABOLIC PANEL
ALK PHOS: 454 U/L — AB (ref 38–126)
ALT: 455 U/L — AB (ref 14–54)
ANION GAP: 7 (ref 5–15)
AST: 243 U/L — ABNORMAL HIGH (ref 15–41)
Albumin: 2.6 g/dL — ABNORMAL LOW (ref 3.5–5.0)
BUN: 14 mg/dL (ref 6–20)
CALCIUM: 7.8 mg/dL — AB (ref 8.9–10.3)
CO2: 26 mmol/L (ref 22–32)
CREATININE: 0.77 mg/dL (ref 0.44–1.00)
Chloride: 107 mmol/L (ref 101–111)
Glucose, Bld: 94 mg/dL (ref 65–99)
Potassium: 3.8 mmol/L (ref 3.5–5.1)
SODIUM: 140 mmol/L (ref 135–145)
Total Bilirubin: 0.7 mg/dL (ref 0.3–1.2)
Total Protein: 5.7 g/dL — ABNORMAL LOW (ref 6.5–8.1)

## 2017-06-15 LAB — TSH: TSH: 1.499 u[IU]/mL (ref 0.350–4.500)

## 2017-06-15 MED ORDER — PRO-STAT SUGAR FREE PO LIQD
30.0000 mL | Freq: Two times a day (BID) | ORAL | Status: DC
  Administered 2017-06-15 – 2017-06-21 (×12): 30 mL via ORAL
  Filled 2017-06-15 (×12): qty 30

## 2017-06-15 MED ORDER — AMLODIPINE BESYLATE 5 MG PO TABS
2.5000 mg | ORAL_TABLET | Freq: Every day | ORAL | Status: DC
  Administered 2017-06-15 – 2017-06-19 (×5): 2.5 mg via ORAL
  Filled 2017-06-15 (×5): qty 1

## 2017-06-15 NOTE — Progress Notes (Signed)
Progress Note   Subjective  Patient had ERCP yesterday with removal of 2 stones from the CBD per Dr. Loletha Carrow. She is feeling well this AM. She has no abdominal pain. She wants to eat. No complaints. Afebrile.    Objective   Vital signs in last 24 hours: Temp:  [97.8 F (36.6 C)-98.4 F (36.9 C)] 97.8 F (36.6 C) (02/09 0432) Pulse Rate:  [58-118] 58 (02/09 0432) Resp:  [14-22] 18 (02/09 0432) BP: (132-185)/(44-84) 156/51 (02/09 0618) SpO2:  [92 %-99 %] 97 % (02/09 0432) Weight:  [130 lb 8.2 oz (59.2 kg)-131 lb 6.3 oz (59.6 kg)] 131 lb 6.3 oz (59.6 kg) (02/09 0602) Last BM Date: 06/13/17 General:    white female in NAD Heart:  Regular rate and rhythm; no murmurs Lungs: Respirations even and unlabored, lungs CTA bilaterally Abdomen:  Soft, nontender, nondistended.  Extremities:  Without edema. Neurologic:  Alert and oriented,  grossly normal neurologically. Psych:  Cooperative. Normal mood and affect.  Intake/Output from previous day: 02/08 0701 - 02/09 0700 In: 1534.5 [I.V.:1434.5; IV Piggyback:100] Out: 600 [Urine:600] Intake/Output this shift: No intake/output data recorded.  Lab Results: Recent Labs    06/13/17 1110 06/14/17 0421 06/15/17 0433  WBC 7.9 8.2 5.7  HGB 11.6* 12.9 11.2*  HCT 35.7* 39.5 34.0*  PLT 245 285 254   BMET Recent Labs    06/13/17 1110 06/14/17 0421 06/15/17 0433  NA 138 140 140  K 3.0* 3.4* 3.8  CL 100* 103 107  CO2 29 28 26   GLUCOSE 142* 148* 94  BUN 23* 16 14  CREATININE 0.99 0.89 0.77  CALCIUM 8.6* 8.3* 7.8*   LFT Recent Labs    06/15/17 0433  PROT 5.7*  ALBUMIN 2.6*  AST 243*  ALT 455*  ALKPHOS 454*  BILITOT 0.7   PT/INR Recent Labs    06/13/17 1301  LABPROT 14.6  INR 1.15    Studies/Results: Mr Thoracic Spine Wo Contrast  Result Date: 06/13/2017 CLINICAL DATA:  Thoracic spine fracture EXAM: MRI THORACIC SPINE WITHOUT CONTRAST TECHNIQUE: Multiplanar, multisequence MR imaging of the thoracic spine was  performed. No intravenous contrast was administered. COMPARISON:  Chest radiograph 06/12/2017 FINDINGS: Alignment:  Increased thoracic kyphosis.  No static subluxation. Vertebrae: There is moderate height loss T9, T10 and T12 with depression of the superior endplates but no marrow edema. These findings were present on prior studies. Cord:  Normal signal and morphology. Paraspinal and other soft tissues: Large right and trace left pleural effusions. Disc levels: No large disc herniation.  No spinal canal stenosis. IMPRESSION: 1. Multilevel chronic vertebral body height loss, particularly at the T9, T10 and T12 levels. This is unchanged compared to multiple old studies. No acute compression fracture. 2. No thoracic spinal canal stenosis or cord abnormality. 3. Large right and small left pleural effusions Electronically Signed   By: Ulyses Jarred M.D.   On: 06/13/2017 20:10   Dg Ercp With Sphincterotomy  Result Date: 06/15/2017 CLINICAL DATA:  84 year old female with choledocholithiasis EXAM: ERCP TECHNIQUE: Multiple spot images obtained with the fluoroscopic device and submitted for interpretation post-procedure. FLUOROSCOPY TIME:  Please see operative note for further detail. COMPARISON:  CT abdomen/pelvis 06/12/2017 FINDINGS: A total of 21 intraoperative saved images are submitted for review. The images depict a flexible endoscope in the descending duodenum with deep wire cannulation of the hepatic ducts. Initial cholangiogram demonstrates a dilated common bile duct with faceted filling defects consistent with choledocholithiasis. The patient is status post cholecystectomy.  Subsequent images document sphincterotomy and balloon sweeping of the common duct. IMPRESSION: 1. Choledocholithiasis. 2. ERCP with sphincterotomy and balloon sweep of the common duct. These images were submitted for radiologic interpretation only. Please see the procedural report for the amount of contrast and the fluoroscopy time utilized.  Electronically Signed   By: Jacqulynn Cadet M.D.   On: 06/15/2017 09:15       Assessment / Plan:   84 y/o female who was admitted with cholangitis / choledocholithiasis, with E. Coli bacteremia and perhaps small liver abscesses on CT scan. She is s/p ERCP yesterday with extraction of 2 stones from the CBD, and has done quite well, no abdominal pain, feels well. Labs show improved LAES, all downtrending at this time.   Today we can advance her diet to regular. Her E. Coli appears pan sensitive, defer antibiotic selection primary service (perhaps cipro?), but with her bacteremia and ? small liver abscess, she needs at least 2 weeks of antibiotics and perhaps longer pending her course. Would continue to trend LFTs and repeat in a few days following her eventual discharge. Her cardiac status otherwise seems stable at this time.   We will sign off for now, please call with any additional questions moving forward.   [facility] Cellar, MD [facility] Pager [phone]

## 2017-06-15 NOTE — Progress Notes (Signed)
Patient ID: [name], female   DOB: [birth year], 84 y.o.   MRN: [id number]                                                                PROGRESS NOTE                                                                                                                                                                                                             Patient Demographics:    [name], is a 84 y.o. female, DOB - 07/27/1918, [id number]  Admit date - 06/12/2017   Admitting Physician Jani Gravel, MD  Outpatient Primary MD for the patient is [name], MD  LOS - 3  Outpatient Specialists:  No chief complaint on file. weakness     Brief Narrative  84 y.o.female,w dementia, hypertension, hyperlipidemia, depression, ? Circulation issues that apparently presents with c/o generalized weakness for the past 1 week. Subjective fever. Pt denies cough, cp, palp, sob, n/v, abd pain, diarrhea, brbpr, black stool. + weight loss.   Pt has trop 0.08, and markedly abnormal liver function. Pt denies new medication. Pt denies any risk for viral hepatitis.       Subjective:    [name] today has had a slight bm early this am. No difficulty urination. .  Seems to be tolerating clear liquids.  Denies fever, chills, n/v, abd pain, diarrhea, brbpr.     Assessment  & Plan :    Active Problems:   Fever   Cholangitis   Choledocholithiasis      Abnormal liver function stable pt has cholelithiasis, ERCP 2/8 with sphincterotomy Appreciate GI  input Will follow LFT cmp in am  ? Fever prior to admission Blood culture + E. Coli.  Aztreonam=> Zosyn pharmacy to dose  Troponin elevation (EF 60-65%) Chronic, stable Cardiac echo 06/13/2017=> EF 60-65%, moderate AR, mod pulm hypertension  Afib with RVR (EF 60-65%)  New overnite (Chads2vasc=4) Cont  Metoprolol 2.5mg  iv q6h (resting hr 65-75) Cont Amiodarone Will try to convert from IV metoprolol  to atenolol in am  tomorrow Defer to cardiology regarding anticoagulation  Hypertension Hydralazine 5mg  iv q6h prn sbp >160  Compression fracture T10, T12 MRI T spine (stable, old fracture) Check vitamin D, tsh in am   Code Status:DNR  Family Communication :spoke with daughter yesterday and attempted contact today to update, no response  Disposition Plan:ALF, [place]  Barriers For Discharge:  Consults :GI, cardiology  Procedures :   DVT Prophylaxis: Lovenox(restart lovenox tonight)- SCDs    Antibiotics :aztreonam2/7, zosyn 2/7=>       Anti-infectives (From admission, onward)   Start     Dose/Rate Route Frequency Ordered Stop   06/13/17 1400  piperacillin-tazobactam (ZOSYN) IVPB 3.375 g     3.375 g 12.5 mL/hr over 240 Minutes Intravenous Every 8 hours 06/13/17 1251     06/13/17 1000  aztreonam (AZACTAM) 1 GM IVPB  Status:  Discontinued     1 g 100 mL/hr over 30 Minutes Intravenous Every 8 hours 06/13/17 0900 06/13/17 1223   06/13/17 0830  aztreonam (AZACTAM) 1 g in dextrose 5 % 50 mL IVPB  Status:  Discontinued     1 g 100 mL/hr over 30 Minutes Intravenous Every 8 hours 06/13/17 0823 06/13/17 0900        Objective:   Vitals:   06/15/17 0039 06/15/17 0044 06/15/17 0432 06/15/17 0602  BP: (!) 138/44 (!) 132/50 (!) 185/69   Pulse:  (!) 59 (!) 58   Resp:   18   Temp:   97.8 F (36.6 C)   TempSrc:   Oral   SpO2:   97%   Weight:    59.6 kg (131 lb 6.3 oz)  Height:        Wt Readings from Last 3 Encounters:  06/15/17 59.6 kg (131 lb 6.3 oz)     Intake/Output Summary (Last 24 hours) at 06/15/2017 0610 Last data filed at 06/15/2017 0200 Gross per 24 hour  Intake 1417.69 ml  Output 600 ml  Net 817.69 ml     Physical Exam  Awake Alert, Oriented X 3, No new F.N deficits, Normal affect [place].AT,PERRAL Supple Neck,No JVD, No cervical lymphadenopathy appriciated.  Symmetrical Chest wall movement, Good air movement bilaterally,  CTAB Irr, irr s1, s2, 2/6 dm rusb +ve B.Sounds, Abd Soft, No tenderness, No organomegaly appriciated, No rebound - guarding or rigidity. No Cyanosis, Clubbing or edema, No new Rash or bruise     Data Review:    CBC Recent Labs  Lab 06/12/17 1723 06/13/17 1110 06/14/17 0421 06/15/17 0433  WBC 13.9* 7.9 8.2 5.7  HGB 12.9 11.6* 12.9 11.2*  HCT 39.0 35.7* 39.5 34.0*  PLT 248 245 285 254  MCV 89.4 90.2 90.2 89.5  MCH 29.6 29.3 29.5 29.5  MCHC 33.1 32.5 32.7 32.9  RDW 14.1 14.1 14.4 14.8    Chemistries  Recent Labs  Lab 06/12/17 1723 06/13/17 1110 06/14/17 0421 06/15/17 0433  NA 138 138 140 140  K 4.0 3.0* 3.4* 3.8  CL 100* 100* 103 107  CO2 28 29 28 26   GLUCOSE 182* 142* 148* 94  BUN 26* 23* 16 14  CREATININE 0.94 0.99 0.89 0.77  CALCIUM 9.3 8.6* 8.3* 7.8*  AST 812* 315* 720* 243*  ALT 713* 479* 679* 455*  ALKPHOS 735* 538* 605* 454*  BILITOT 2.7* 1.1 1.3* 0.7   ------------------------------------------------------------------------------------------------------------------ No results for input(s): CHOL, HDL, LDLCALC, TRIG, CHOLHDL, LDLDIRECT in the last 72 hours.  Lab Results  Component Value Date   HGBA1C 6.0 (H) 06/12/2017   ------------------------------------------------------------------------------------------------------------------ No results for input(s): TSH, T4TOTAL, T3FREE, THYROIDAB in the last 72 hours.  Invalid input(s): FREET3 ------------------------------------------------------------------------------------------------------------------ No results for input(s): VITAMINB12, FOLATE, FERRITIN, TIBC, IRON, RETICCTPCT in the last 72 hours.  Coagulation profile Recent Labs  Lab 06/13/17 1301  INR 1.15    No results for input(s): DDIMER in the last 72 hours.  Cardiac Enzymes Recent Labs  Lab 06/12/17 1723  06/14/17 0421 06/14/17 1014 06/14/17 1601  CKMB 8.6*  --   --   --   --   TROPONINI 0.08*   < > 0.06* 0.08* 0.06*   < > =  values in this interval not displayed.   ------------------------------------------------------------------------------------------------------------------ No results found for: BNP  Inpatient Medications  Scheduled Meds: . feeding supplement (PRO-STAT SUGAR FREE 64)  30 mL Oral BID  . indomethacin  100 mg Rectal Once  . lactose free nutrition  237 mL Oral BID BM  . metoprolol tartrate  2.5 mg Intravenous Q6H   Continuous Infusions: . amiodarone 30 mg/hr (06/15/17 0130)  . piperacillin-tazobactam (ZOSYN)  IV 3.375 g (06/15/17 0546)   PRN Meds:.acetaminophen **OR** acetaminophen, hydrALAZINE, iopamidol  Micro Results Recent Results (from the past 240 hour(s))  Culture, blood (routine x 2)     Status: Abnormal (Preliminary result)   Collection Time: 06/12/17  5:23 PM  Result Value Ref Range Status   Specimen Description   Final    BLOOD BLOOD LEFT ARM Performed at [facility] [address] [place], [place] [address]    Special Requests   Final    BOTTLES DRAWN AEROBIC AND ANAEROBIC Blood Culture adequate volume Performed at [facility] [address], [place], [place] [address]    Culture  Setup Time   Final    GRAM NEGATIVE RODS IN BOTH AEROBIC AND ANAEROBIC BOTTLES CRITICAL RESULT CALLED TO, READ BACK BY AND VERIFIED WITH: JScherrie November PHARMD, AT 0258 06/13/17 BY D. VANHOOK    Culture (A)  Final    ESCHERICHIA COLI SUSCEPTIBILITIES TO FOLLOW Performed at [facility], [place] [address] [place], [place] [address]    Report Status PENDING  Incomplete  Blood Culture ID Panel (Reflexed)     Status: Abnormal   Collection Time: 06/12/17  5:23 PM  Result Value Ref Range Status   Enterococcus species NOT DETECTED NOT DETECTED Final   Vancomycin resistance NOT DETECTED NOT DETECTED Final   Listeria monocytogenes NOT DETECTED NOT DETECTED Final   Staphylococcus species NOT DETECTED NOT DETECTED Final   Staphylococcus aureus NOT  DETECTED NOT DETECTED Final   Methicillin resistance NOT DETECTED NOT DETECTED Final   Streptococcus species NOT DETECTED NOT DETECTED Final   Streptococcus agalactiae NOT DETECTED NOT DETECTED Final   Streptococcus pneumoniae NOT DETECTED NOT DETECTED Final   Streptococcus pyogenes NOT DETECTED NOT DETECTED Final   Acinetobacter baumannii NOT DETECTED NOT DETECTED Final   Enterobacteriaceae species DETECTED (A) NOT DETECTED Final    Comment: CRITICAL RESULT CALLED TO, READ BACK BY AND VERIFIED WITH: JScherrie November PHARMD, AT 0801 06/13/17 BY D. VANHOOK Enterobacteriaceae represent a large family of gram-negative bacteria, not a single organism.    Enterobacter cloacae complex NOT DETECTED NOT DETECTED Final   Escherichia coli DETECTED (A) NOT DETECTED Final    Comment: CRITICAL RESULT CALLED TO, READ BACK BY AND VERIFIED WITH: J. GADHIA PHARMD, AT 0801 06/13/17 BY D. VANHOOK    Klebsiella oxytoca NOT DETECTED NOT DETECTED Final   Klebsiella pneumoniae NOT DETECTED NOT DETECTED Final   Proteus species NOT DETECTED NOT DETECTED Final   Serratia marcescens NOT DETECTED NOT DETECTED Final   Carbapenem resistance NOT DETECTED NOT DETECTED Final   Haemophilus influenzae NOT DETECTED NOT DETECTED Final   Neisseria  meningitidis NOT DETECTED NOT DETECTED Final   Pseudomonas aeruginosa NOT DETECTED NOT DETECTED Final   Candida albicans NOT DETECTED NOT DETECTED Final   Candida glabrata NOT DETECTED NOT DETECTED Final   Candida krusei NOT DETECTED NOT DETECTED Final   Candida parapsilosis NOT DETECTED NOT DETECTED Final   Candida tropicalis NOT DETECTED NOT DETECTED Final    Comment: Performed at [facility], [place] [address], [place], [place] [address]  Culture, blood (routine x 2)     Status: None (Preliminary result)   Collection Time: 06/12/17  5:32 PM  Result Value Ref Range Status   Specimen Description   Final    BLOOD BLOOD RIGHT HAND Performed at [facility]  [address] [place], [place] [address]    Special Requests   Final    BOTTLES DRAWN AEROBIC AND ANAEROBIC Blood Culture adequate volume Performed at [facility] [address], [place], [place] [address]    Culture  Setup Time   Final    GRAM NEGATIVE RODS IN BOTH AEROBIC AND ANAEROBIC BOTTLES CRITICAL VALUE NOTED.  VALUE IS CONSISTENT WITH PREVIOUSLY REPORTED AND CALLED VALUE. Performed at [facility], [place] [address], [place], [place] [address]    Culture GRAM NEGATIVE RODS  Final   Report Status PENDING  Incomplete  Urine culture     Status: Abnormal   Collection Time: 06/12/17  7:40 PM  Result Value Ref Range Status   Specimen Description   Final    URINE, CLEAN CATCH Performed at [facility], [place] [address], [place], [place] [address]    Special Requests   Final    NONE Performed at [facility], [place] [address], [place], [place] [address]    Culture MULTIPLE SPECIES PRESENT, SUGGEST RECOLLECTION (A)  Final   Report Status 06/14/2017 FINAL  Final  MRSA PCR Screening     Status: None   Collection Time: 06/14/17  5:42 AM  Result Value Ref Range Status   MRSA by PCR NEGATIVE NEGATIVE Final    Comment:        The GeneXpert MRSA Assay (FDA approved for NASAL specimens only), is one component of a comprehensive MRSA colonization surveillance program. It is not intended to diagnose MRSA infection nor to guide or monitor treatment for MRSA infections. Performed at [facility], [place] [address], [place], [place] [address]     Radiology Reports Dg Chest 2 View  Result Date: 06/12/2017 CLINICAL DATA:  Fever EXAM: CHEST  2 VIEW COMPARISON:  06/12/2017, 09/10/2016 FINDINGS: Negative for heart failure. Atherosclerotic calcification aortic arch. Mild right pleural effusion with mild right lower lobe atelectasis. No definite pneumonia. Multiple mild chronic compression fractures in the thoracic spine  with kyphosis. Advanced degenerative change left shoulder joint IMPRESSION: Mild right lower lobe atelectasis and small right effusion. No definite pneumonia. Electronically Signed   By: Franchot Gallo M.D.   On: 06/12/2017 20:35   Ct Head Wo Contrast  Result Date: 06/12/2017 CLINICAL DATA:  Dementia EXAM: CT HEAD WITHOUT CONTRAST TECHNIQUE: Contiguous axial images were obtained from the base of the skull through the vertex without intravenous contrast. COMPARISON:  10/07/2011 FINDINGS: Brain: Moderate atrophy, with progression since 2013. Moderate chronic microvascular ischemic change in the white matter, with progression. Hypodensity left thalamus consistent with chronic infarct. Negative for acute hemorrhage or mass.  No midline shift Vascular: Negative for vascular thrombosis Skull: Negative Sinuses/Orbits: Negative Other: None IMPRESSION: Progression  of atrophy and chronic microvascular ischemia since 2013. No acute abnormality. Electronically Signed   By: Franchot Gallo M.D.   On: 06/12/2017 20:40   Mr Thoracic Spine Wo Contrast  Result Date: 06/13/2017 CLINICAL DATA:  Thoracic spine fracture EXAM: MRI THORACIC SPINE WITHOUT CONTRAST TECHNIQUE: Multiplanar, multisequence MR imaging of the thoracic spine was performed. No intravenous contrast was administered. COMPARISON:  Chest radiograph 06/12/2017 FINDINGS: Alignment:  Increased thoracic kyphosis.  No static subluxation. Vertebrae: There is moderate height loss T9, T10 and T12 with depression of the superior endplates but no marrow edema. These findings were present on prior studies. Cord:  Normal signal and morphology. Paraspinal and other soft tissues: Large right and trace left pleural effusions. Disc levels: No large disc herniation.  No spinal canal stenosis. IMPRESSION: 1. Multilevel chronic vertebral body height loss, particularly at the T9, T10 and T12 levels. This is unchanged compared to multiple old studies. No acute compression fracture. 2.  No thoracic spinal canal stenosis or cord abnormality. 3. Large right and small left pleural effusions Electronically Signed   By: Ulyses Jarred M.D.   On: 06/13/2017 20:10   Ct Abdomen Pelvis W Contrast  Addendum Date: 06/13/2017   ADDENDUM REPORT: 06/13/2017 09:21 ADDENDUM: At least 2 stones in the distal common bile duct with prominent common bile duct wall enhancement. Consider cholangitis as cause of symptoms. These findings were discussed with Dr. Maudie Mercury. The 2 small low densities in the right liver could reflect developing biloma/abscess. No drainable collection. Electronically Signed   By: Monte Fantasia M.D.   On: 06/13/2017 09:21   Result Date: 06/13/2017 CLINICAL DATA:  Dementia generalize weakness fever positive weight loss abnormal liver function EXAM: CT ABDOMEN AND PELVIS WITH CONTRAST TECHNIQUE: Multidetector CT imaging of the abdomen and pelvis was performed using the standard protocol following bolus administration of intravenous contrast. CONTRAST:  152mL ISOVUE-300 IOPAMIDOL (ISOVUE-300) INJECTION 61% COMPARISON:  Radiograph 09/04/2016, CT chest 07/15/2008 FINDINGS: Lower chest: Small right-sided pleural effusion with partial atelectasis at the right base. The left lung base is clear. Borderline heart size. Coronary vascular calcification. Hepatobiliary: Heterogeneous liver parenchyma with vague hypoenhancing foci in the right lobe, for example series 2, image number 18 measuring 3 mm, and within the dome of the liver measuring approximately 1.9 cm. Surgical clips in the gallbladder fossa. No biliary dilatation. Pancreas: Unremarkable. No pancreatic ductal dilatation or surrounding inflammatory changes. Spleen: Normal in size without focal abnormality. Adrenals/Urinary Tract: Adrenal glands are within normal limits. Diffuse cortical thinning of the kidneys. No hydronephrosis. The bladder is unremarkable Stomach/Bowel: Stomach is nonenlarged. No dilated small bowel. No colon wall thickening.  Sigmoid colon diverticular disease. Vascular/Lymphatic: Extensive aortic atherosclerosis without aneurysmal dilatation. No significantly enlarged lymph nodes. Reproductive: Status post hysterectomy. No adnexal masses. Other: Negative for free air or free fluid. Musculoskeletal: Degenerative changes of the spine. Trace retrolisthesis of L1 on L2. Mild to moderate compression deformities at T12 and T10. IMPRESSION: 1. Coarse heterogeneous appearance of the liver with diffuse decreased density suggesting steatosis or hepatocellular disease. There are vague indeterminate hypoenhancing lesions within the dome of the liver and right lobe, for which nonemergent liver MRI could be helpful to further evaluate. There is no biliary dilatation in this post cholecystectomy patient. 2. Sigmoid colon diverticular disease without acute inflammation 3. Small right pleural effusion with partial atelectasis at the right base 4. Mild to moderate compression deformities at T10 and T12. Electronically Signed: By: Donavan Foil M.D. On: 06/12/2017 20:33  Time Spent in minutes  30   Jani Gravel M.D on 06/15/2017 at 6:10 AM  Between 7am to 7am- Pager - [phone]

## 2017-06-15 NOTE — Progress Notes (Signed)
Progress Note  Patient Name: [name] Date of Encounter: 06/15/2017  Primary Cardiologist: No primary care provider on file.   Subjective   No cardiac complaints resting comfortably no chest pain   Inpatient Medications    Scheduled Meds: . feeding supplement (PRO-STAT SUGAR FREE 64)  30 mL Oral BID  . indomethacin  100 mg Rectal Once  . lactose free nutrition  237 mL Oral BID BM  . metoprolol tartrate  2.5 mg Intravenous Q6H   Continuous Infusions: . amiodarone 30 mg/hr (06/15/17 0130)  . piperacillin-tazobactam (ZOSYN)  IV 3.375 g (06/15/17 0546)   PRN Meds: acetaminophen **OR** acetaminophen, hydrALAZINE, iopamidol   Vital Signs    Vitals:   06/15/17 0044 06/15/17 0432 06/15/17 0602 06/15/17 0618  BP: (!) 132/50 (!) 185/69  (!) 156/51  Pulse: (!) 59 (!) 58    Resp:  18    Temp:  97.8 F (36.6 C)    TempSrc:  Oral    SpO2:  97%    Weight:   131 lb 6.3 oz (59.6 kg)   Height:        Intake/Output Summary (Last 24 hours) at 06/15/2017 0845 Last data filed at 06/15/2017 0600 Gross per 24 hour  Intake 1534.49 ml  Output 600 ml  Net 934.49 ml   Filed Weights   06/14/17 0538 06/14/17 1200 06/15/17 0602  Weight: 130 lb 8.2 oz (59.2 kg) 130 lb 8.2 oz (59.2 kg) 131 lb 6.3 oz (59.6 kg)    Telemetry    NSR - Personally Reviewed  ECG    NSR no acute ST chagnes  - Personally Reviewed  Physical Exam  Frail thin white female  GEN: No acute distress.   Neck: No JVD Cardiac: RRR, AR  murmurs, rubs, or gallops.  Respiratory: Clear to auscultation bilaterally. GI: Soft, nontender, non-distended  MS: No edema; No deformity. Neuro:  Nonfocal  Psych: Normal affect   Labs    Chemistry Recent Labs  Lab 06/13/17 1110 06/14/17 0421 06/15/17 0433  NA 138 140 140  K 3.0* 3.4* 3.8  CL 100* 103 107  CO2 29 28 26   GLUCOSE 142* 148* 94  BUN 23* 16 14  CREATININE 0.99 0.89 0.77  CALCIUM 8.6* 8.3* 7.8*  PROT 6.0* 6.5 5.7*  ALBUMIN 2.8* 3.0* 2.6*  AST 315*  720* 243*  ALT 479* 679* 455*  ALKPHOS 538* 605* 454*  BILITOT 1.1 1.3* 0.7  GFRNONAA 47* 53* >60  GFRAA 54* >60 >60  ANIONGAP 9 9 7      Hematology Recent Labs  Lab 06/13/17 1110 06/14/17 0421 06/15/17 0433  WBC 7.9 8.2 5.7  RBC 3.96 4.38 3.80*  HGB 11.6* 12.9 11.2*  HCT 35.7* 39.5 34.0*  MCV 90.2 90.2 89.5  MCH 29.3 29.5 29.5  MCHC 32.5 32.7 32.9  RDW 14.1 14.4 14.8  PLT 245 285 254    Cardiac Enzymes Recent Labs  Lab 06/13/17 1110 06/14/17 0421 06/14/17 1014 06/14/17 1601  TROPONINI 0.07* 0.06* 0.08* 0.06*   No results for input(s): TROPIPOC in the last 168 hours.   BNPNo results for input(s): BNP, PROBNP in the last 168 hours.   DDimer No results for input(s): DDIMER in the last 168 hours.   Radiology    Mr Thoracic Spine Wo Contrast  Result Date: 06/13/2017 CLINICAL DATA:  Thoracic spine fracture EXAM: MRI THORACIC SPINE WITHOUT CONTRAST TECHNIQUE: Multiplanar, multisequence MR imaging of the thoracic spine was performed. No intravenous contrast was administered. COMPARISON:  Chest radiograph 06/12/2017 FINDINGS: Alignment:  Increased thoracic kyphosis.  No static subluxation. Vertebrae: There is moderate height loss T9, T10 and T12 with depression of the superior endplates but no marrow edema. These findings were present on prior studies. Cord:  Normal signal and morphology. Paraspinal and other soft tissues: Large right and trace left pleural effusions. Disc levels: No large disc herniation.  No spinal canal stenosis. IMPRESSION: 1. Multilevel chronic vertebral body height loss, particularly at the T9, T10 and T12 levels. This is unchanged compared to multiple old studies. No acute compression fracture. 2. No thoracic spinal canal stenosis or cord abnormality. 3. Large right and small left pleural effusions Electronically Signed   By: Ulyses Jarred M.D.   On: 06/13/2017 20:10    Cardiac Studies   Echo EF 60-65% Moderate AR   Patient Profile     84 y.o. female  with dementia admitted with cholangitis Minimally elevated troponin With no cardiac symptoms or acute ECG changes. Cleared to have ERCP to cleanout 2 CBD stones done yesterday with no complications  Assessment & Plan    1. GB:  Post ERCP LFTls coming down Liquids per GI no lovenox SCD;s 2. Troponin: trivial elevation no signs of acute heart issues 3. AR moderate EF normal no need to follow in 84 yo with dementia  Will sign off  For questions or updates, please contact [facility] Please consult [email or web address] for contact info under Cardiology/STEMI.      Signed, Jenkins Rouge, MD  06/15/2017, 8:45 AM

## 2017-06-16 LAB — CBC
HCT: 38.7 % (ref 36.0–46.0)
Hemoglobin: 12.6 g/dL (ref 12.0–15.0)
MCH: 29.2 pg (ref 26.0–34.0)
MCHC: 32.6 g/dL (ref 30.0–36.0)
MCV: 89.6 fL (ref 78.0–100.0)
PLATELETS: 303 10*3/uL (ref 150–400)
RBC: 4.32 MIL/uL (ref 3.87–5.11)
RDW: 14.8 % (ref 11.5–15.5)
WBC: 9.8 10*3/uL (ref 4.0–10.5)

## 2017-06-16 LAB — COMPREHENSIVE METABOLIC PANEL
ALBUMIN: 2.8 g/dL — AB (ref 3.5–5.0)
ALT: 324 U/L — AB (ref 14–54)
AST: 108 U/L — AB (ref 15–41)
Alkaline Phosphatase: 430 U/L — ABNORMAL HIGH (ref 38–126)
Anion gap: 8 (ref 5–15)
BUN: 15 mg/dL (ref 6–20)
CHLORIDE: 108 mmol/L (ref 101–111)
CO2: 24 mmol/L (ref 22–32)
CREATININE: 0.82 mg/dL (ref 0.44–1.00)
Calcium: 8.2 mg/dL — ABNORMAL LOW (ref 8.9–10.3)
GFR calc Af Amer: >60 mL/min (ref >60)
GFR, EST NON AFRICAN AMERICAN: 59 mL/min — AB (ref >60)
Glucose, Bld: 109 mg/dL — ABNORMAL HIGH (ref 65–99)
POTASSIUM: 3.5 mmol/L (ref 3.5–5.1)
SODIUM: 140 mmol/L (ref 135–145)
Total Bilirubin: 0.5 mg/dL (ref 0.3–1.2)
Total Protein: 6.2 g/dL — ABNORMAL LOW (ref 6.5–8.1)

## 2017-06-16 LAB — CULTURE, BLOOD (ROUTINE X 2)
Special Requests: ADEQUATE
Special Requests: ADEQUATE

## 2017-06-16 MED ORDER — METOPROLOL TARTRATE 25 MG PO TABS
12.5000 mg | ORAL_TABLET | Freq: Two times a day (BID) | ORAL | Status: DC
  Administered 2017-06-16 (×2): 12.5 mg via ORAL
  Filled 2017-06-16 (×2): qty 1

## 2017-06-16 MED ORDER — SACCHAROMYCES BOULARDII 250 MG PO CAPS
250.0000 mg | ORAL_CAPSULE | Freq: Two times a day (BID) | ORAL | Status: DC
  Administered 2017-06-16 – 2017-06-21 (×11): 250 mg via ORAL
  Filled 2017-06-16 (×12): qty 1

## 2017-06-16 MED ORDER — AMOXICILLIN-POT CLAVULANATE 875-125 MG PO TABS
1.0000 | ORAL_TABLET | Freq: Two times a day (BID) | ORAL | Status: DC
  Administered 2017-06-16 – 2017-06-21 (×11): 1 via ORAL
  Filled 2017-06-16 (×11): qty 1

## 2017-06-16 MED ORDER — METOPROLOL TARTRATE 5 MG/5ML IV SOLN
2.5000 mg | Freq: Four times a day (QID) | INTRAVENOUS | Status: DC | PRN
  Filled 2017-06-16: qty 5

## 2017-06-16 NOTE — H&P (Addendum)
PROGRESS NOTE                                                                                                                                                                                                             Patient Demographics:    [name], is a 84 y.o. female, DOB - 12-03-1918, GEX:[id number]  Admit date - 06/12/2017   Admitting Physician Jani Gravel, MD  Outpatient Primary MD for the patient is [name], MD  LOS - 4  Outpatient Specialists:     No chief complaint on file. Weakness, fever      Brief Narrative     84 y.o.female,w dementia, hypertension, hyperlipidemia, depression, ? Circulation issues that apparently presents with c/o generalized weakness for the past 1 week. Subjective fever. Pt denies cough, cp, palp, sob, n/v, abd pain, diarrhea, brbpr, black stool. + weight loss.   Pt has trop 0.08, and markedly abnormal liver function. Pt denies new medication. Pt denies any risk for viral hepatitis.        Subjective:    [name] today has been afebrile.  Apparently HR 180's w movement last nite per RN.   Tolerating regular diet.  Denies fever, chills, n/v, abd pain, diarrhea, brbpr.   No chest pain, No abdominal pain - No Nausea, No new weakness tingling or numbness, No Cough - SOB.   Assessment  & Plan :    Active Problems:   Fever   Cholangitis   Choledocholithiasis     Abnormal liver functionstable pt has cholelithiasis, ERCP 2/8 with sphincterotomy Appreciate GI input Will follow LFT cmpin am  ? Fever prior to admission Blood culture + E. Coli.  Aztreonam=> Zosynpharmacy to dose (2/7=> 2/10) DC Zosyn today,  Start Augmentin 875mg  po bid Start Florastor  Troponin elevation(EF 60-65%) Chronic, stable Cardiac echo 06/13/2017=> EF 60-65%, moderate AR, mod pulm hypertension  Afib with RVR (EF 60-65%) New overnite (Chads2vasc=4) Currently on  Metoprolol 2.5mg  iv q6h (resting hr 65-75) Cont Amiodarone Start Metoprolol 12.5mg  po bid Metoprolol 2.5mg  iv q6h prn HR  >100 persistent Defer to cardiology regarding continuing Amiodarone and anticoagulation ? Aspirin vs plavix vs full anticoagulation  Hypertension  Restart amlodipine 2.5mg  po qday Cont Hydralazine 5mg  iv q6h prn sbp >160  Compression fracture T10, T12 MRI T spine(stable, old fracture)  Severe protein calorie malnutrition prostat    Code Status:DNR  Family Communication :spoke with daughter yesterday and updated her  Disposition Plan:ALF, [facility]  Barriers For Discharge:  Consults :GI, cardiology  Procedures :   DVT Prophylaxis: Lovenox SCDs    Antibiotics :aztreonam2/7, zosyn 2/7=>2/10 Augmentin 2/10=>     Anti-infectives (From admission, onward)   Start     Dose/Rate Route Frequency Ordered Stop   06/13/17 1400  piperacillin-tazobactam (ZOSYN) IVPB 3.375 g     3.375 g 12.5 mL/hr over 240 Minutes Intravenous Every 8 hours 06/13/17 1251     06/13/17 1000  aztreonam (AZACTAM) 1 GM IVPB  Status:  Discontinued     1 g 100 mL/hr over 30 Minutes Intravenous Every 8 hours 06/13/17 0900 06/13/17 1223   06/13/17 0830  aztreonam (AZACTAM) 1 g in dextrose 5 % 50 mL IVPB  Status:  Discontinued     1 g 100 mL/hr over 30 Minutes Intravenous Every 8 hours 06/13/17 0823 06/13/17 0900        Objective:   Vitals:   06/15/17 1718 06/15/17 2005 06/16/17 0049 06/16/17 0419  BP: (!) 153/49 (!) 176/60 (!) 170/66 (!) 142/62  Pulse: 74 90 92 (!) 112  Resp:  20  16  Temp:  98.3 F (36.8 C)  98.7 F (37.1 C)  TempSrc:  Oral  Oral  SpO2:  96%  93%  Weight:    61.3 kg (135 lb 2.3 oz)  Height:        Wt Readings from Last 3 Encounters:  06/16/17 61.3 kg (135 lb 2.3 oz)     Intake/Output Summary (Last 24 hours) at 06/16/2017 0532 Last data filed at 06/16/2017 0422 Gross per 24 hour  Intake 790.8 ml  Output  1050 ml  Net -259.2 ml     Physical Exam  Awake Alert, Oriented X 3, No new F.N deficits, Normal affect [place].AT,PERRAL Supple Neck,No JVD, No cervical lymphadenopathy appriciated.  Symmetrical Chest wall movement, Good air movement bilaterally, CTAB RRR,2/6 sem  +ve B.Sounds, Abd Soft, No tenderness, No organomegaly appriciated, No rebound - guarding or rigidity. No Cyanosis, Clubbing or edema, No new Rash or bruise     Data Review:    CBC Recent Labs  Lab 06/12/17 1723 06/13/17 1110 06/14/17 0421 06/15/17 0433 06/16/17 0451  WBC 13.9* 7.9 8.2 5.7 9.8  HGB 12.9 11.6* 12.9 11.2* 12.6  HCT 39.0 35.7* 39.5 34.0* 38.7  PLT 248 245 285 254 303  MCV 89.4 90.2 90.2 89.5 89.6  MCH 29.6 29.3 29.5 29.5 29.2  MCHC 33.1 32.5 32.7 32.9 32.6  RDW 14.1 14.1 14.4 14.8 14.8    Chemistries  Recent Labs  Lab 06/12/17 1723 06/13/17 1110 06/14/17 0421 06/15/17 0433  NA 138 138 140 140  K 4.0 3.0* 3.4* 3.8  CL 100* 100* 103 107  CO2 28 29 28 26   GLUCOSE 182* 142* 148* 94  BUN 26* 23* 16 14  CREATININE 0.94 0.99 0.89 0.77  CALCIUM 9.3 8.6* 8.3* 7.8*  AST 812* 315* 720* 243*  ALT 713* 479* 679* 455*  ALKPHOS 735* 538* 605* 454*  BILITOT 2.7* 1.1 1.3* 0.7   ------------------------------------------------------------------------------------------------------------------ No results for input(s): CHOL, HDL, LDLCALC, TRIG, CHOLHDL, LDLDIRECT in the last 72 hours.  Lab Results  Component Value Date   HGBA1C 6.0 (H) 06/12/2017   ------------------------------------------------------------------------------------------------------------------ Recent  Labs    06/15/17 0433  TSH 1.499   ------------------------------------------------------------------------------------------------------------------ No results for input(s): VITAMINB12, FOLATE, FERRITIN, TIBC, IRON, RETICCTPCT in the last 72 hours.  Coagulation profile Recent Labs  Lab 06/13/17 1301  INR 1.15    No results  for input(s): DDIMER in the last 72 hours.  Cardiac Enzymes Recent Labs  Lab 06/12/17 1723  06/14/17 0421 06/14/17 1014 06/14/17 1601  CKMB 8.6*  --   --   --   --   TROPONINI 0.08*   < > 0.06* 0.08* 0.06*   < > = values in this interval not displayed.   ------------------------------------------------------------------------------------------------------------------ No results found for: BNP  Inpatient Medications  Scheduled Meds: . amLODipine  2.5 mg Oral Daily  . feeding supplement (PRO-STAT SUGAR FREE 64)  30 mL Oral BID  . indomethacin  100 mg Rectal Once  . lactose free nutrition  237 mL Oral BID BM  . metoprolol tartrate  2.5 mg Intravenous Q6H   Continuous Infusions: . amiodarone 30 mg/hr (06/16/17 0305)  . piperacillin-tazobactam (ZOSYN)  IV 3.375 g (06/16/17 0522)   PRN Meds:.acetaminophen **OR** acetaminophen, hydrALAZINE, iopamidol  Micro Results Recent Results (from the past 240 hour(s))  Culture, blood (routine x 2)     Status: Abnormal (Preliminary result)   Collection Time: 06/12/17  5:23 PM  Result Value Ref Range Status   Specimen Description   Final    BLOOD BLOOD LEFT ARM Performed at [facility] [address], [place], [place] [address]    Special Requests   Final    BOTTLES DRAWN AEROBIC AND ANAEROBIC Blood Culture adequate volume Performed at [facility] [address], [place], [place] [address]    Culture  Setup Time   Final    GRAM NEGATIVE RODS IN BOTH AEROBIC AND ANAEROBIC BOTTLES CRITICAL RESULT CALLED TO, READ BACK BY AND VERIFIED WITH: Melodye Ped PHARMD, AT 5366 06/13/17 BY Rush Landmark Performed at [facility], [place] [address] [place], [place] [address]    Culture ESCHERICHIA COLI (A)  Final   Report Status PENDING  Incomplete   Organism ID, Bacteria ESCHERICHIA COLI  Final      Susceptibility   Escherichia coli - MIC*    AMPICILLIN <=2 SENSITIVE Sensitive     CEFAZOLIN <=4 SENSITIVE  Sensitive     CEFEPIME <=1 SENSITIVE Sensitive     CEFTAZIDIME <=1 SENSITIVE Sensitive     CEFTRIAXONE <=1 SENSITIVE Sensitive     CIPROFLOXACIN <=0.25 SENSITIVE Sensitive     GENTAMICIN <=1 SENSITIVE Sensitive     IMIPENEM <=0.25 SENSITIVE Sensitive     TRIMETH/SULFA <=20 SENSITIVE Sensitive     AMPICILLIN/SULBACTAM <=2 SENSITIVE Sensitive     PIP/TAZO <=4 SENSITIVE Sensitive     Extended ESBL NEGATIVE Sensitive     * ESCHERICHIA COLI  Blood Culture ID Panel (Reflexed)     Status: Abnormal   Collection Time: 06/12/17  5:23 PM  Result Value Ref Range Status   Enterococcus species NOT DETECTED NOT DETECTED Final   Vancomycin resistance NOT DETECTED NOT DETECTED Final   Listeria monocytogenes NOT DETECTED NOT DETECTED Final   Staphylococcus species NOT DETECTED NOT DETECTED Final   Staphylococcus aureus NOT DETECTED NOT DETECTED Final   Methicillin resistance NOT DETECTED NOT DETECTED Final   Streptococcus species NOT DETECTED NOT DETECTED Final   Streptococcus agalactiae NOT DETECTED NOT DETECTED Final   Streptococcus pneumoniae NOT DETECTED NOT DETECTED Final   Streptococcus pyogenes NOT DETECTED  NOT DETECTED Final   Acinetobacter baumannii NOT DETECTED NOT DETECTED Final   Enterobacteriaceae species DETECTED (A) NOT DETECTED Final    Comment: CRITICAL RESULT CALLED TO, READ BACK BY AND VERIFIED WITH: J. GADHIA PHARMD, AT 0801 06/13/17 BY D. VANHOOK Enterobacteriaceae represent a large family of gram-negative bacteria, not a single organism.    Enterobacter cloacae complex NOT DETECTED NOT DETECTED Final   Escherichia coli DETECTED (A) NOT DETECTED Final    Comment: CRITICAL RESULT CALLED TO, READ BACK BY AND VERIFIED WITH: J. GADHIA PHARMD, AT 0801 06/13/17 BY D. VANHOOK    Klebsiella oxytoca NOT DETECTED NOT DETECTED Final   Klebsiella pneumoniae NOT DETECTED NOT DETECTED Final   Proteus species NOT DETECTED NOT DETECTED Final   Serratia marcescens NOT DETECTED NOT DETECTED Final    Carbapenem resistance NOT DETECTED NOT DETECTED Final   Haemophilus influenzae NOT DETECTED NOT DETECTED Final   Neisseria meningitidis NOT DETECTED NOT DETECTED Final   Pseudomonas aeruginosa NOT DETECTED NOT DETECTED Final   Candida albicans NOT DETECTED NOT DETECTED Final   Candida glabrata NOT DETECTED NOT DETECTED Final   Candida krusei NOT DETECTED NOT DETECTED Final   Candida parapsilosis NOT DETECTED NOT DETECTED Final   Candida tropicalis NOT DETECTED NOT DETECTED Final    Comment: Performed at [facility], [place] [address] [place], [place] [address]  Culture, blood (routine x 2)     Status: Abnormal (Preliminary result)   Collection Time: 06/12/17  5:32 PM  Result Value Ref Range Status   Specimen Description   Final    BLOOD BLOOD RIGHT HAND Performed at [facility] [address], [place], [place] [address]    Special Requests   Final    BOTTLES DRAWN AEROBIC AND ANAEROBIC Blood Culture adequate volume Performed at [facility] [address] [place], [place] [address]    Culture  Setup Time   Final    GRAM NEGATIVE RODS IN BOTH AEROBIC AND ANAEROBIC BOTTLES CRITICAL VALUE NOTED.  VALUE IS CONSISTENT WITH PREVIOUSLY REPORTED AND CALLED VALUE.    Culture (A)  Final    ESCHERICHIA COLI SUSCEPTIBILITIES PERFORMED ON PREVIOUS CULTURE WITHIN THE LAST 5 DAYS. Performed at [facility], [place] [address] [place], [place] [address]    Report Status PENDING  Incomplete  Urine culture     Status: Abnormal   Collection Time: 06/12/17  7:40 PM  Result Value Ref Range Status   Specimen Description   Final    URINE, CLEAN CATCH Performed at [facility], [place] [address], [place], [place] [address]    Special Requests   Final    NONE Performed at [facility], [place] [address] [place], [place] [address]    Culture MULTIPLE SPECIES PRESENT, SUGGEST RECOLLECTION (A)  Final   Report Status  06/14/2017 FINAL  Final  MRSA PCR Screening     Status: None   Collection Time: 06/14/17  5:42 AM  Result Value Ref Range Status   MRSA by PCR NEGATIVE NEGATIVE Final    Comment:        The GeneXpert MRSA Assay (FDA approved for NASAL specimens only), is one component of a comprehensive MRSA colonization surveillance program. It is not intended to diagnose MRSA infection nor to guide or monitor treatment for MRSA infections. Performed at [facility], [place] [address], [place],  [address]     Radiology Reports Dg Chest 2 View  Result Date: 06/12/2017 CLINICAL DATA:  Fever EXAM: CHEST  2 VIEW COMPARISON:  06/12/2017, 09/10/2016 FINDINGS: Negative for heart failure. Atherosclerotic calcification aortic arch. Mild right pleural effusion with mild right lower lobe atelectasis. No definite pneumonia. Multiple mild chronic compression fractures in the thoracic spine with kyphosis. Advanced degenerative change left shoulder joint IMPRESSION: Mild right lower lobe atelectasis and small right effusion. No definite pneumonia. Electronically Signed   By: Franchot Gallo M.D.   On: 06/12/2017 20:35   Ct Head Wo Contrast  Result Date: 06/12/2017 CLINICAL DATA:  Dementia EXAM: CT HEAD WITHOUT CONTRAST TECHNIQUE: Contiguous axial images were obtained from the base of the skull through the vertex without intravenous contrast. COMPARISON:  10/07/2011 FINDINGS: Brain: Moderate atrophy, with progression since 2013. Moderate chronic microvascular ischemic change in the white matter, with progression. Hypodensity left thalamus consistent with chronic infarct. Negative for acute hemorrhage or mass.  No midline shift Vascular: Negative for vascular thrombosis Skull: Negative Sinuses/Orbits: Negative Other: None IMPRESSION: Progression of atrophy and chronic microvascular ischemia since 2013. No acute abnormality. Electronically Signed   By: Franchot Gallo M.D.   On: 06/12/2017 20:40   Mr  Thoracic Spine Wo Contrast  Result Date: 06/13/2017 CLINICAL DATA:  Thoracic spine fracture EXAM: MRI THORACIC SPINE WITHOUT CONTRAST TECHNIQUE: Multiplanar, multisequence MR imaging of the thoracic spine was performed. No intravenous contrast was administered. COMPARISON:  Chest radiograph 06/12/2017 FINDINGS: Alignment:  Increased thoracic kyphosis.  No static subluxation. Vertebrae: There is moderate height loss T9, T10 and T12 with depression of the superior endplates but no marrow edema. These findings were present on prior studies. Cord:  Normal signal and morphology. Paraspinal and other soft tissues: Large right and trace left pleural effusions. Disc levels: No large disc herniation.  No spinal canal stenosis. IMPRESSION: 1. Multilevel chronic vertebral body height loss, particularly at the T9, T10 and T12 levels. This is unchanged compared to multiple old studies. No acute compression fracture. 2. No thoracic spinal canal stenosis or cord abnormality. 3. Large right and small left pleural effusions Electronically Signed   By: Ulyses Jarred M.D.   On: 06/13/2017 20:10   Ct Abdomen Pelvis W Contrast  Addendum Date: 06/13/2017   ADDENDUM REPORT: 06/13/2017 09:21 ADDENDUM: At least 2 stones in the distal common bile duct with prominent common bile duct wall enhancement. Consider cholangitis as cause of symptoms. These findings were discussed with Dr. Maudie Mercury. The 2 small low densities in the right liver could reflect developing biloma/abscess. No drainable collection. Electronically Signed   By: Monte Fantasia M.D.   On: 06/13/2017 09:21   Result Date: 06/13/2017 CLINICAL DATA:  Dementia generalize weakness fever positive weight loss abnormal liver function EXAM: CT ABDOMEN AND PELVIS WITH CONTRAST TECHNIQUE: Multidetector CT imaging of the abdomen and pelvis was performed using the standard protocol following bolus administration of intravenous contrast. CONTRAST:  172mL ISOVUE-300 IOPAMIDOL (ISOVUE-300)  INJECTION 61% COMPARISON:  Radiograph 09/04/2016, CT chest 07/15/2008 FINDINGS: Lower chest: Small right-sided pleural effusion with partial atelectasis at the right base. The left lung base is clear. Borderline heart size. Coronary vascular calcification. Hepatobiliary: Heterogeneous liver parenchyma with vague hypoenhancing foci in the right lobe, for example series 2, image number 18 measuring 3 mm, and within the dome of the liver measuring approximately 1.9 cm. Surgical clips in the gallbladder fossa. No biliary dilatation. Pancreas: Unremarkable. No pancreatic ductal dilatation or surrounding inflammatory changes. Spleen: Normal in size without focal abnormality. Adrenals/Urinary Tract: Adrenal glands are within normal limits. Diffuse  cortical thinning of the kidneys. No hydronephrosis. The bladder is unremarkable Stomach/Bowel: Stomach is nonenlarged. No dilated small bowel. No colon wall thickening. Sigmoid colon diverticular disease. Vascular/Lymphatic: Extensive aortic atherosclerosis without aneurysmal dilatation. No significantly enlarged lymph nodes. Reproductive: Status post hysterectomy. No adnexal masses. Other: Negative for free air or free fluid. Musculoskeletal: Degenerative changes of the spine. Trace retrolisthesis of L1 on L2. Mild to moderate compression deformities at T12 and T10. IMPRESSION: 1. Coarse heterogeneous appearance of the liver with diffuse decreased density suggesting steatosis or hepatocellular disease. There are vague indeterminate hypoenhancing lesions within the dome of the liver and right lobe, for which nonemergent liver MRI could be helpful to further evaluate. There is no biliary dilatation in this post cholecystectomy patient. 2. Sigmoid colon diverticular disease without acute inflammation 3. Small right pleural effusion with partial atelectasis at the right base 4. Mild to moderate compression deformities at T10 and T12. Electronically Signed: By: Donavan Foil M.D.  On: 06/12/2017 20:33   Dg Ercp With Sphincterotomy  Result Date: 06/15/2017 CLINICAL DATA:  84 year old female with choledocholithiasis EXAM: ERCP TECHNIQUE: Multiple spot images obtained with the fluoroscopic device and submitted for interpretation post-procedure. FLUOROSCOPY TIME:  Please see operative note for further detail. COMPARISON:  CT abdomen/pelvis 06/12/2017 FINDINGS: A total of 21 intraoperative saved images are submitted for review. The images depict a flexible endoscope in the descending duodenum with deep wire cannulation of the hepatic ducts. Initial cholangiogram demonstrates a dilated common bile duct with faceted filling defects consistent with choledocholithiasis. The patient is status post cholecystectomy. Subsequent images document sphincterotomy and balloon sweeping of the common duct. IMPRESSION: 1. Choledocholithiasis. 2. ERCP with sphincterotomy and balloon sweep of the common duct. These images were submitted for radiologic interpretation only. Please see the procedural report for the amount of contrast and the fluoroscopy time utilized. Electronically Signed   By: Jacqulynn Cadet M.D.   On: 06/15/2017 09:15    Time Spent in minutes  30   Jani Gravel M.D on 06/16/2017 at 5:32 AM  Between 7am to 7am - Pager - [phone]

## 2017-06-16 NOTE — Progress Notes (Addendum)
Late entry: Upon assisting Pt to the Walker noted to elevate as hight as 180. Once Pt was assisted back to bed HR decreased back to 90s-120s. EKG obtained and placed in chart.  Will continue to monitor pt and keep in bed for the time being.

## 2017-06-16 NOTE — Progress Notes (Signed)
MD Maudie Mercury notified of patient's 6 beat run of VT along with in & out of AF and first degree AVB. Pt asymptomatic, VSS.

## 2017-06-16 NOTE — Progress Notes (Signed)
Progress Note  Patient Name: [name] Date of Encounter: 06/16/2017  Primary Cardiologist: No primary care provider on file.   Subjective   No chest pain   Inpatient Medications    Scheduled Meds: . amLODipine  2.5 mg Oral Daily  . amoxicillin-clavulanate  1 tablet Oral Q12H  . feeding supplement (PRO-STAT SUGAR FREE 64)  30 mL Oral BID  . indomethacin  100 mg Rectal Once  . lactose free nutrition  237 mL Oral BID BM  . metoprolol tartrate  12.5 mg Oral BID  . saccharomyces boulardii  250 mg Oral BID   Continuous Infusions: . amiodarone 30 mg/hr (06/16/17 0305)   PRN Meds: acetaminophen **OR** acetaminophen, hydrALAZINE, iopamidol, metoprolol tartrate   Vital Signs    Vitals:   06/15/17 1718 06/15/17 2005 06/16/17 0049 06/16/17 0419  BP: (!) 153/49 (!) 176/60 (!) 170/66 (!) 142/62  Pulse: 74 90 92 (!) 112  Resp:  20  16  Temp:  98.3 F (36.8 C)  98.7 F (37.1 C)  TempSrc:  Oral  Oral  SpO2:  96%  93%  Weight:    135 lb 2.3 oz (61.3 kg)  Height:        Intake/Output Summary (Last 24 hours) at 06/16/2017 0744 Last data filed at 06/16/2017 0600 Gross per 24 hour  Intake 724 ml  Output 1750 ml  Net -1026 ml   Filed Weights   06/14/17 1200 06/15/17 0602 06/16/17 0419  Weight: 130 lb 8.2 oz (59.2 kg) 131 lb 6.3 oz (59.6 kg) 135 lb 2.3 oz (61.3 kg)    Telemetry    NSR - Personally Reviewed  ECG    NSR no acute ST chagnes  - Personally Reviewed  Physical Exam  Frail thin white female  GEN: No acute distress.   Neck: No JVD Cardiac: RRR, AR  murmurs, rubs, or gallops.  Respiratory: Clear to auscultation bilaterally. GI: Soft, nontender, non-distended  MS: No edema; No deformity. Neuro:  Nonfocal  Psych: Normal affect   Labs    Chemistry Recent Labs  Lab 06/14/17 0421 06/15/17 0433 06/16/17 0451  NA 140 140 140  K 3.4* 3.8 3.5  CL 103 107 108  CO2 28 26 24   GLUCOSE 148* 94 109*  BUN 16 14 15   CREATININE 0.89 0.77 0.82  CALCIUM 8.3*  7.8* 8.2*  PROT 6.5 5.7* 6.2*  ALBUMIN 3.0* 2.6* 2.8*  AST 720* 243* 108*  ALT 679* 455* 324*  ALKPHOS 605* 454* 430*  BILITOT 1.3* 0.7 0.5  GFRNONAA 53* >60 59*  GFRAA >60 >60 >60  ANIONGAP 9 7 8      Hematology Recent Labs  Lab 06/14/17 0421 06/15/17 0433 06/16/17 0451  WBC 8.2 5.7 9.8  RBC 4.38 3.80* 4.32  HGB 12.9 11.2* 12.6  HCT 39.5 34.0* 38.7  MCV 90.2 89.5 89.6  MCH 29.5 29.5 29.2  MCHC 32.7 32.9 32.6  RDW 14.4 14.8 14.8  PLT 285 254 303    Cardiac Enzymes Recent Labs  Lab 06/13/17 1110 06/14/17 0421 06/14/17 1014 06/14/17 1601  TROPONINI 0.07* 0.06* 0.08* 0.06*   No results for input(s): TROPIPOC in the last 168 hours.   BNPNo results for input(s): BNP, PROBNP in the last 168 hours.   DDimer No results for input(s): DDIMER in the last 168 hours.   Radiology    Dg Ercp With Sphincterotomy  Result Date: 06/15/2017 CLINICAL DATA:  84 year old female with choledocholithiasis EXAM: ERCP TECHNIQUE: Multiple spot images obtained with the  fluoroscopic device and submitted for interpretation post-procedure. FLUOROSCOPY TIME:  Please see operative note for further detail. COMPARISON:  CT abdomen/pelvis 06/12/2017 FINDINGS: A total of 21 intraoperative saved images are submitted for review. The images depict a flexible endoscope in the descending duodenum with deep wire cannulation of the hepatic ducts. Initial cholangiogram demonstrates a dilated common bile duct with faceted filling defects consistent with choledocholithiasis. The patient is status post cholecystectomy. Subsequent images document sphincterotomy and balloon sweeping of the common duct. IMPRESSION: 1. Choledocholithiasis. 2. ERCP with sphincterotomy and balloon sweep of the common duct. These images were submitted for radiologic interpretation only. Please see the procedural report for the amount of contrast and the fluoroscopy time utilized. Electronically Signed   By: Jacqulynn Cadet M.D.   On:  06/15/2017 09:15    Cardiac Studies   Echo EF 60-65% Moderate AR   Patient Profile     84 y.o. female with dementia admitted with cholangitis Minimally elevated troponin With no cardiac symptoms or acute ECG changes. Cleared to have ERCP to cleanout 2 CBD stones done yesterday with no complications  Assessment & Plan    1. GB:  Post ERCP LFTls coming down Liquids per GI no lovenox SCD;s 2. Troponin: trivial elevation no signs of acute heart issues 3. AR moderate EF normal no need to follow in 84 yo with dementia  Will sign off  For questions or updates, please contact [place] Please consult [email or web address] for contact info under Cardiology/STEMI.      Signed, Jenkins Rouge, MD  06/16/2017, 7:44 AM

## 2017-06-17 ENCOUNTER — Encounter (HOSPITAL_COMMUNITY): Payer: Self-pay | Admitting: Gastroenterology

## 2017-06-17 LAB — COMPREHENSIVE METABOLIC PANEL
ALBUMIN: 2.9 g/dL — AB (ref 3.5–5.0)
ALT: 230 U/L — AB (ref 14–54)
AST: 66 U/L — AB (ref 15–41)
Alkaline Phosphatase: 370 U/L — ABNORMAL HIGH (ref 38–126)
Anion gap: 11 (ref 5–15)
BILIRUBIN TOTAL: 0.4 mg/dL (ref 0.3–1.2)
BUN: 9 mg/dL (ref 6–20)
CO2: 22 mmol/L (ref 22–32)
CREATININE: 0.63 mg/dL (ref 0.44–1.00)
Calcium: 8.3 mg/dL — ABNORMAL LOW (ref 8.9–10.3)
Chloride: 101 mmol/L (ref 101–111)
GFR calc Af Amer: >60 mL/min (ref >60)
GFR calc non Af Amer: >60 mL/min (ref >60)
GLUCOSE: 114 mg/dL — AB (ref 65–99)
POTASSIUM: 3.2 mmol/L — AB (ref 3.5–5.1)
Sodium: 134 mmol/L — ABNORMAL LOW (ref 135–145)
TOTAL PROTEIN: 6.4 g/dL — AB (ref 6.5–8.1)

## 2017-06-17 LAB — CBC
HEMATOCRIT: 41.3 % (ref 36.0–46.0)
HEMOGLOBIN: 13.7 g/dL (ref 12.0–15.0)
MCH: 29.7 pg (ref 26.0–34.0)
MCHC: 33.2 g/dL (ref 30.0–36.0)
MCV: 89.4 fL (ref 78.0–100.0)
Platelets: 343 10*3/uL (ref 150–400)
RBC: 4.62 MIL/uL (ref 3.87–5.11)
RDW: 14.8 % (ref 11.5–15.5)
WBC: 11.8 10*3/uL — AB (ref 4.0–10.5)

## 2017-06-17 LAB — VITAMIN D 25 HYDROXY (VIT D DEFICIENCY, FRACTURES): VIT D 25 HYDROXY: 40.3 ng/mL (ref 30.0–100.0)

## 2017-06-17 LAB — MAGNESIUM: Magnesium: 1.8 mg/dL (ref 1.7–2.4)

## 2017-06-17 MED ORDER — METOPROLOL TARTRATE 25 MG PO TABS
25.0000 mg | ORAL_TABLET | Freq: Two times a day (BID) | ORAL | Status: DC
  Administered 2017-06-17 – 2017-06-18 (×4): 25 mg via ORAL
  Filled 2017-06-17 (×4): qty 1

## 2017-06-17 MED ORDER — ASPIRIN EC 81 MG PO TBEC
81.0000 mg | DELAYED_RELEASE_TABLET | Freq: Every day | ORAL | Status: DC
  Administered 2017-06-17 – 2017-06-21 (×5): 81 mg via ORAL
  Filled 2017-06-17 (×5): qty 1

## 2017-06-17 MED ORDER — POTASSIUM CHLORIDE CRYS ER 20 MEQ PO TBCR
40.0000 meq | EXTENDED_RELEASE_TABLET | Freq: Once | ORAL | Status: AC
  Administered 2017-06-17: 40 meq via ORAL
  Filled 2017-06-17: qty 2

## 2017-06-17 MED ORDER — DOCUSATE SODIUM 100 MG PO CAPS
100.0000 mg | ORAL_CAPSULE | Freq: Every day | ORAL | Status: DC
  Administered 2017-06-17 – 2017-06-21 (×5): 100 mg via ORAL
  Filled 2017-06-17 (×5): qty 1

## 2017-06-17 NOTE — Progress Notes (Signed)
Patient ID: [name], female   DOB: 09-May-1918, 84 y.o.   MRN: [id number]                                                                PROGRESS NOTE                                                                                                                                                                                                             Patient Demographics:    Tanaka Gillen, is a 84 y.o. female, DOB - 1919/02/23, EXN:[id number]  Admit date - 06/12/2017   Admitting Physician Jani Gravel, MD  Outpatient Primary MD for the patient is [name], MD  LOS - 5  Outpatient Specialists:   No chief complaint on file. Weakness, fever     Brief Narrative    84 y.o.female,w dementia, hypertension, hyperlipidemia, depression, ? Circulation issues that apparently presents with c/o generalized weakness for the past 1 week. Subjective fever. Pt denies cough, cp, palp, sob, n/v, abd pain, diarrhea, brbpr, black stool. + weight loss.   Pt has trop 0.08, and markedly abnormal liver function. Pt denies new medication. Pt denies any risk for viral hepatitis.      Subjective:    [name] today has been afebrile.  Had 6 beats of VT , yesterday.  Asymptomatic.  Cardiology signed off.   No headache, No chest pain, No abdominal pain - No Nausea, No new weakness tingling or numbness, No Cough - SOB.    Assessment  & Plan :    Active Problems:   Fever   Cholangitis   Choledocholithiasis     Abnormal liver functionstable pt has cholelithiasis, ERCP2/8 with sphincterotomy Appreciate GI input Will follow LFT cmpin am  ? Fever prior to admission Blood culture + E. Coli.  Aztreonam=> Zosynpharmacy to dose (2/7=> 2/10) DC Zosyn 2/10 Cont Augmentin 875mg  po bid (2/10=>) Cont Florastor  Troponin elevation(EF 60-65%) Chronic, stable Cardiac echo 06/13/2017=> EF 60-65%, moderate AR, mod pulm hypertension  Afib with RVR (EF 60-65%) New overnite  (Chads2vasc=4) Increase Metoprolol 25mg  po bid Metoprolol 2.5mg  iv q6h prn HR  >100 persistent STOP Amiodarone 2/11 Start aspirin 81mg  po qday Too much fall risk for conventional anticoagulation due to dementia and hx of falls  Hypertension Cont amlodipine  2.5mg  po qday Cont Hydralazine 5mg  iv q6h prn sbp >160  Compression fracture T10, T12 MRI T spine(stable, old fracture)  Severe protein calorie malnutrition prostat    Code Status:DNR  Family Communication :spoke with daughter yesterday and updated her  Disposition Plan:ALF, [facility]  Barriers For Discharge:  Consults :GI, cardiology  Procedures :   DVT Prophylaxis: Lovenox SCDs    Antibiotics :aztreonam2/7, zosyn 2/7=>2/10 Augmentin 2/10=>      Anti-infectives (From admission, onward)   Start     Dose/Rate Route Frequency Ordered Stop   06/16/17 1000  amoxicillin-clavulanate (AUGMENTIN) 875-125 MG per tablet 1 tablet     1 tablet Oral Every 12 hours 06/16/17 0621     06/13/17 1400  piperacillin-tazobactam (ZOSYN) IVPB 3.375 g  Status:  Discontinued     3.375 g 12.5 mL/hr over 240 Minutes Intravenous Every 8 hours 06/13/17 1251 06/16/17 0646   06/13/17 1000  aztreonam (AZACTAM) 1 GM IVPB  Status:  Discontinued     1 g 100 mL/hr over 30 Minutes Intravenous Every 8 hours 06/13/17 0900 06/13/17 1223   06/13/17 0830  aztreonam (AZACTAM) 1 g in dextrose 5 % 50 mL IVPB  Status:  Discontinued     1 g 100 mL/hr over 30 Minutes Intravenous Every 8 hours 06/13/17 0823 06/13/17 0900        Objective:   Vitals:   06/16/17 2151 06/16/17 2313 06/17/17 0003 06/17/17 0223  BP:  (!) 189/94 (!) 164/85 (!) 152/90  Pulse: (!) 106     Resp:      Temp:      TempSrc:      SpO2:      Weight:      Height:        Wt Readings from Last 3 Encounters:  06/16/17 61.3 kg (135 lb 2.3 oz)     Intake/Output Summary (Last 24 hours) at 06/17/2017 0503 Last data filed at 06/17/2017  0200 Gross per 24 hour  Intake 870.8 ml  Output 1650 ml  Net -779.2 ml     Physical Exam  Awake Alert, Oriented X 3, No new F.N deficits, Normal affect [place].AT,PERRAL Supple Neck,No JVD, No cervical lymphadenopathy appriciated.  Symmetrical Chest wall movement, Good air movement bilaterally, CTAB Irr, Irr, s1, s2, 2/6 dm rusb  +ve B.Sounds, Abd Soft, No tenderness, No organomegaly appriciated, No rebound - guarding or rigidity. No Cyanosis, Clubbing or edema, No new Rash or bruise     Data Review:    CBC Recent Labs  Lab 06/12/17 1723 06/13/17 1110 06/14/17 0421 06/15/17 0433 06/16/17 0451  WBC 13.9* 7.9 8.2 5.7 9.8  HGB 12.9 11.6* 12.9 11.2* 12.6  HCT 39.0 35.7* 39.5 34.0* 38.7  PLT 248 245 285 254 303  MCV 89.4 90.2 90.2 89.5 89.6  MCH 29.6 29.3 29.5 29.5 29.2  MCHC 33.1 32.5 32.7 32.9 32.6  RDW 14.1 14.1 14.4 14.8 14.8    Chemistries  Recent Labs  Lab 06/12/17 1723 06/13/17 1110 06/14/17 0421 06/15/17 0433 06/16/17 0451  NA 138 138 140 140 140  K 4.0 3.0* 3.4* 3.8 3.5  CL 100* 100* 103 107 108  CO2 28 29 28 26 24   GLUCOSE 182* 142* 148* 94 109*  BUN 26* 23* 16 14 15   CREATININE 0.94 0.99 0.89 0.77 0.82  CALCIUM 9.3 8.6* 8.3* 7.8* 8.2*  AST 812* 315* 720* 243* 108*  ALT 713* 479* 679* 455* 324*  ALKPHOS 735* 538* 605* 454* 430*  BILITOT 2.7* 1.1 1.3*  0.7 0.5   ------------------------------------------------------------------------------------------------------------------ No results for input(s): CHOL, HDL, LDLCALC, TRIG, CHOLHDL, LDLDIRECT in the last 72 hours.  Lab Results  Component Value Date   HGBA1C 6.0 (H) 06/12/2017   ------------------------------------------------------------------------------------------------------------------ Recent Labs    06/15/17 0433  TSH 1.499   ------------------------------------------------------------------------------------------------------------------ No results for input(s): VITAMINB12, FOLATE,  FERRITIN, TIBC, IRON, RETICCTPCT in the last 72 hours.  Coagulation profile Recent Labs  Lab 06/13/17 1301  INR 1.15    No results for input(s): DDIMER in the last 72 hours.  Cardiac Enzymes Recent Labs  Lab 06/12/17 1723  06/14/17 0421 06/14/17 1014 06/14/17 1601  CKMB 8.6*  --   --   --   --   TROPONINI 0.08*   < > 0.06* 0.08* 0.06*   < > = values in this interval not displayed.   ------------------------------------------------------------------------------------------------------------------ No results found for: BNP  Inpatient Medications  Scheduled Meds: . amLODipine  2.5 mg Oral Daily  . amoxicillin-clavulanate  1 tablet Oral Q12H  . feeding supplement (PRO-STAT SUGAR FREE 64)  30 mL Oral BID  . indomethacin  100 mg Rectal Once  . lactose free nutrition  237 mL Oral BID BM  . metoprolol tartrate  12.5 mg Oral BID  . saccharomyces boulardii  250 mg Oral BID   Continuous Infusions: . amiodarone 30 mg/hr (06/17/17 0338)   PRN Meds:.acetaminophen **OR** acetaminophen, hydrALAZINE, iopamidol, metoprolol tartrate  Micro Results Recent Results (from the past 240 hour(s))  Culture, blood (routine x 2)     Status: Abnormal   Collection Time: 06/12/17  5:23 PM  Result Value Ref Range Status   Specimen Description   Final    BLOOD BLOOD LEFT ARM Performed at [facility] [address] [place], [place] [address]    Special Requests   Final    BOTTLES DRAWN AEROBIC AND ANAEROBIC Blood Culture adequate volume Performed at [facility] [address] [place], [place] [address]    Culture  Setup Time   Final    GRAM NEGATIVE RODS IN BOTH AEROBIC AND ANAEROBIC BOTTLES CRITICAL RESULT CALLED TO, READ BACK BY AND VERIFIED WITH: Melodye Ped PHARMD, AT 8295 06/13/17 BY Rush Landmark Performed at [facility], [place] [address], [place], [place] [address]    Culture ESCHERICHIA COLI (A)  Final   Report Status 06/16/2017 FINAL  Final    Organism ID, Bacteria ESCHERICHIA COLI  Final      Susceptibility   Escherichia coli - MIC*    AMPICILLIN <=2 SENSITIVE Sensitive     CEFAZOLIN <=4 SENSITIVE Sensitive     CEFEPIME <=1 SENSITIVE Sensitive     CEFTAZIDIME <=1 SENSITIVE Sensitive     CEFTRIAXONE <=1 SENSITIVE Sensitive     CIPROFLOXACIN <=0.25 SENSITIVE Sensitive     GENTAMICIN <=1 SENSITIVE Sensitive     IMIPENEM <=0.25 SENSITIVE Sensitive     TRIMETH/SULFA <=20 SENSITIVE Sensitive     AMPICILLIN/SULBACTAM <=2 SENSITIVE Sensitive     PIP/TAZO <=4 SENSITIVE Sensitive     Extended ESBL NEGATIVE Sensitive     * ESCHERICHIA COLI  Blood Culture ID Panel (Reflexed)     Status: Abnormal   Collection Time: 06/12/17  5:23 PM  Result Value Ref Range Status   Enterococcus species NOT DETECTED NOT DETECTED Final   Vancomycin resistance NOT DETECTED NOT DETECTED Final   Listeria monocytogenes NOT DETECTED NOT DETECTED Final   Staphylococcus species NOT DETECTED NOT DETECTED Final   Staphylococcus aureus NOT DETECTED  NOT DETECTED Final   Methicillin resistance NOT DETECTED NOT DETECTED Final   Streptococcus species NOT DETECTED NOT DETECTED Final   Streptococcus agalactiae NOT DETECTED NOT DETECTED Final   Streptococcus pneumoniae NOT DETECTED NOT DETECTED Final   Streptococcus pyogenes NOT DETECTED NOT DETECTED Final   Acinetobacter baumannii NOT DETECTED NOT DETECTED Final   Enterobacteriaceae species DETECTED (A) NOT DETECTED Final    Comment: CRITICAL RESULT CALLED TO, READ BACK BY AND VERIFIED WITH: J. GADHIA PHARMD, AT 0801 06/13/17 BY D. VANHOOK Enterobacteriaceae represent a large family of gram-negative bacteria, not a single organism.    Enterobacter cloacae complex NOT DETECTED NOT DETECTED Final   Escherichia coli DETECTED (A) NOT DETECTED Final    Comment: CRITICAL RESULT CALLED TO, READ BACK BY AND VERIFIED WITH: J. GADHIA PHARMD, AT 0801 06/13/17 BY D. VANHOOK    Klebsiella oxytoca NOT DETECTED NOT DETECTED  Final   Klebsiella pneumoniae NOT DETECTED NOT DETECTED Final   Proteus species NOT DETECTED NOT DETECTED Final   Serratia marcescens NOT DETECTED NOT DETECTED Final   Carbapenem resistance NOT DETECTED NOT DETECTED Final   Haemophilus influenzae NOT DETECTED NOT DETECTED Final   Neisseria meningitidis NOT DETECTED NOT DETECTED Final   Pseudomonas aeruginosa NOT DETECTED NOT DETECTED Final   Candida albicans NOT DETECTED NOT DETECTED Final   Candida glabrata NOT DETECTED NOT DETECTED Final   Candida krusei NOT DETECTED NOT DETECTED Final   Candida parapsilosis NOT DETECTED NOT DETECTED Final   Candida tropicalis NOT DETECTED NOT DETECTED Final    Comment: Performed at [facility], [place] [address] [place], [place] [address]  Culture, blood (routine x 2)     Status: Abnormal   Collection Time: 06/12/17  5:32 PM  Result Value Ref Range Status   Specimen Description   Final    BLOOD BLOOD RIGHT HAND Performed at [facility] [address], [place], [place] [address]    Special Requests   Final    BOTTLES DRAWN AEROBIC AND ANAEROBIC Blood Culture adequate volume Performed at [facility] [address] [place], [place] [address]    Culture  Setup Time   Final    GRAM NEGATIVE RODS IN BOTH AEROBIC AND ANAEROBIC BOTTLES CRITICAL VALUE NOTED.  VALUE IS CONSISTENT WITH PREVIOUSLY REPORTED AND CALLED VALUE.    Culture (A)  Final    ESCHERICHIA COLI SUSCEPTIBILITIES PERFORMED ON PREVIOUS CULTURE WITHIN THE LAST 5 DAYS. Performed at [facility], [place] [address] [place], [place] [address]    Report Status 06/16/2017 FINAL  Final  Urine culture     Status: Abnormal   Collection Time: 06/12/17  7:40 PM  Result Value Ref Range Status   Specimen Description   Final    URINE, CLEAN CATCH Performed at [facility], [place] [address], [place], [place] [address]    Special Requests   Final    NONE Performed at [facility], [place] [address] [place],  [address]    Culture MULTIPLE SPECIES PRESENT, SUGGEST RECOLLECTION (A)  Final   Report Status 06/14/2017 FINAL  Final  MRSA PCR Screening     Status: None   Collection Time: 06/14/17  5:42 AM  Result Value Ref Range Status   MRSA by PCR NEGATIVE NEGATIVE Final    Comment:        The GeneXpert MRSA Assay (FDA approved for NASAL specimens only), is one component of a comprehensive MRSA  colonization surveillance program. It is not intended to diagnose MRSA infection nor to guide or monitor treatment for MRSA infections. Performed at [facility], [place] [address] [place], [place] [address]     Radiology Reports Dg Chest 2 View  Result Date: 06/12/2017 CLINICAL DATA:  Fever EXAM: CHEST  2 VIEW COMPARISON:  06/12/2017, 09/10/2016 FINDINGS: Negative for heart failure. Atherosclerotic calcification aortic arch. Mild right pleural effusion with mild right lower lobe atelectasis. No definite pneumonia. Multiple mild chronic compression fractures in the thoracic spine with kyphosis. Advanced degenerative change left shoulder joint IMPRESSION: Mild right lower lobe atelectasis and small right effusion. No definite pneumonia. Electronically Signed   By: Franchot Gallo M.D.   On: 06/12/2017 20:35   Ct Head Wo Contrast  Result Date: 06/12/2017 CLINICAL DATA:  Dementia EXAM: CT HEAD WITHOUT CONTRAST TECHNIQUE: Contiguous axial images were obtained from the base of the skull through the vertex without intravenous contrast. COMPARISON:  10/07/2011 FINDINGS: Brain: Moderate atrophy, with progression since 2013. Moderate chronic microvascular ischemic change in the white matter, with progression. Hypodensity left thalamus consistent with chronic infarct. Negative for acute hemorrhage or mass.  No midline shift Vascular: Negative for vascular thrombosis Skull: Negative Sinuses/Orbits: Negative Other: None IMPRESSION: Progression of  atrophy and chronic microvascular ischemia since 2013. No acute abnormality. Electronically Signed   By: Franchot Gallo M.D.   On: 06/12/2017 20:40   Mr Thoracic Spine Wo Contrast  Result Date: 06/13/2017 CLINICAL DATA:  Thoracic spine fracture EXAM: MRI THORACIC SPINE WITHOUT CONTRAST TECHNIQUE: Multiplanar, multisequence MR imaging of the thoracic spine was performed. No intravenous contrast was administered. COMPARISON:  Chest radiograph 06/12/2017 FINDINGS: Alignment:  Increased thoracic kyphosis.  No static subluxation. Vertebrae: There is moderate height loss T9, T10 and T12 with depression of the superior endplates but no marrow edema. These findings were present on prior studies. Cord:  Normal signal and morphology. Paraspinal and other soft tissues: Large right and trace left pleural effusions. Disc levels: No large disc herniation.  No spinal canal stenosis. IMPRESSION: 1. Multilevel chronic vertebral body height loss, particularly at the T9, T10 and T12 levels. This is unchanged compared to multiple old studies. No acute compression fracture. 2. No thoracic spinal canal stenosis or cord abnormality. 3. Large right and small left pleural effusions Electronically Signed   By: Ulyses Jarred M.D.   On: 06/13/2017 20:10   Ct Abdomen Pelvis W Contrast  Addendum Date: 06/13/2017   ADDENDUM REPORT: 06/13/2017 09:21 ADDENDUM: At least 2 stones in the distal common bile duct with prominent common bile duct wall enhancement. Consider cholangitis as cause of symptoms. These findings were discussed with Dr. Maudie Mercury. The 2 small low densities in the right liver could reflect developing biloma/abscess. No drainable collection. Electronically Signed   By: Monte Fantasia M.D.   On: 06/13/2017 09:21   Result Date: 06/13/2017 CLINICAL DATA:  Dementia generalize weakness fever positive weight loss abnormal liver function EXAM: CT ABDOMEN AND PELVIS WITH CONTRAST TECHNIQUE: Multidetector CT imaging of the abdomen and  pelvis was performed using the standard protocol following bolus administration of intravenous contrast. CONTRAST:  130mL ISOVUE-300 IOPAMIDOL (ISOVUE-300) INJECTION 61% COMPARISON:  Radiograph 09/04/2016, CT chest 07/15/2008 FINDINGS: Lower chest: Small right-sided pleural effusion with partial atelectasis at the right base. The left lung base is clear. Borderline heart size. Coronary vascular calcification. Hepatobiliary: Heterogeneous liver parenchyma with vague hypoenhancing foci in the right lobe, for example series 2, image number 18 measuring 3 mm, and within  the dome of the liver measuring approximately 1.9 cm. Surgical clips in the gallbladder fossa. No biliary dilatation. Pancreas: Unremarkable. No pancreatic ductal dilatation or surrounding inflammatory changes. Spleen: Normal in size without focal abnormality. Adrenals/Urinary Tract: Adrenal glands are within normal limits. Diffuse cortical thinning of the kidneys. No hydronephrosis. The bladder is unremarkable Stomach/Bowel: Stomach is nonenlarged. No dilated small bowel. No colon wall thickening. Sigmoid colon diverticular disease. Vascular/Lymphatic: Extensive aortic atherosclerosis without aneurysmal dilatation. No significantly enlarged lymph nodes. Reproductive: Status post hysterectomy. No adnexal masses. Other: Negative for free air or free fluid. Musculoskeletal: Degenerative changes of the spine. Trace retrolisthesis of L1 on L2. Mild to moderate compression deformities at T12 and T10. IMPRESSION: 1. Coarse heterogeneous appearance of the liver with diffuse decreased density suggesting steatosis or hepatocellular disease. There are vague indeterminate hypoenhancing lesions within the dome of the liver and right lobe, for which nonemergent liver MRI could be helpful to further evaluate. There is no biliary dilatation in this post cholecystectomy patient. 2. Sigmoid colon diverticular disease without acute inflammation 3. Small right pleural  effusion with partial atelectasis at the right base 4. Mild to moderate compression deformities at T10 and T12. Electronically Signed: By: Donavan Foil M.D. On: 06/12/2017 20:33   Dg Ercp With Sphincterotomy  Result Date: 06/15/2017 CLINICAL DATA:  84 year old female with choledocholithiasis EXAM: ERCP TECHNIQUE: Multiple spot images obtained with the fluoroscopic device and submitted for interpretation post-procedure. FLUOROSCOPY TIME:  Please see operative note for further detail. COMPARISON:  CT abdomen/pelvis 06/12/2017 FINDINGS: A total of 21 intraoperative saved images are submitted for review. The images depict a flexible endoscope in the descending duodenum with deep wire cannulation of the hepatic ducts. Initial cholangiogram demonstrates a dilated common bile duct with faceted filling defects consistent with choledocholithiasis. The patient is status post cholecystectomy. Subsequent images document sphincterotomy and balloon sweeping of the common duct. IMPRESSION: 1. Choledocholithiasis. 2. ERCP with sphincterotomy and balloon sweep of the common duct. These images were submitted for radiologic interpretation only. Please see the procedural report for the amount of contrast and the fluoroscopy time utilized. Electronically Signed   By: Jacqulynn Cadet M.D.   On: 06/15/2017 09:15    Time Spent in minutes  30   Jani Gravel M.D on 06/17/2017 at 5:03 AM  Between 7am to 7am - Pager - [phone]

## 2017-06-18 LAB — COMPREHENSIVE METABOLIC PANEL WITH GFR
ALT: 166 U/L — ABNORMAL HIGH (ref 14–54)
AST: 52 U/L — ABNORMAL HIGH (ref 15–41)
Albumin: 2.7 g/dL — ABNORMAL LOW (ref 3.5–5.0)
Alkaline Phosphatase: 302 U/L — ABNORMAL HIGH (ref 38–126)
Anion gap: 11 (ref 5–15)
BUN: 20 mg/dL (ref 6–20)
CO2: 24 mmol/L (ref 22–32)
Calcium: 8.5 mg/dL — ABNORMAL LOW (ref 8.9–10.3)
Chloride: 103 mmol/L (ref 101–111)
Creatinine, Ser: 0.79 mg/dL (ref 0.44–1.00)
GFR calc Af Amer: >60 mL/min (ref >60)
GFR calc non Af Amer: >60 mL/min (ref >60)
Glucose, Bld: 105 mg/dL — ABNORMAL HIGH (ref 65–99)
Potassium: 3.7 mmol/L (ref 3.5–5.1)
Sodium: 138 mmol/L (ref 135–145)
Total Bilirubin: 0.7 mg/dL (ref 0.3–1.2)
Total Protein: 5.6 g/dL — ABNORMAL LOW (ref 6.5–8.1)

## 2017-06-18 MED ORDER — METOPROLOL TARTRATE 25 MG PO TABS: 25.0000 mg | ORAL_TABLET | Freq: Two times a day (BID) | ORAL | 0 refills | Status: DC

## 2017-06-18 MED ORDER — DOCUSATE SODIUM 100 MG PO CAPS: 100.0000 mg | ORAL_CAPSULE | Freq: Every day | ORAL | 0 refills | Status: AC

## 2017-06-18 MED ORDER — AMOXICILLIN-POT CLAVULANATE 875-125 MG PO TABS: 1.0000 | ORAL_TABLET | Freq: Two times a day (BID) | ORAL | 0 refills | Status: AC

## 2017-06-18 MED ORDER — SACCHAROMYCES BOULARDII 250 MG PO CAPS: 250.0000 mg | ORAL_CAPSULE | Freq: Two times a day (BID) | ORAL | 0 refills | Status: AC

## 2017-06-18 MED ORDER — PRO-STAT SUGAR FREE PO LIQD: 30.0000 mL | Freq: Two times a day (BID) | ORAL | 0 refills | Status: AC

## 2017-06-18 MED ORDER — ASPIRIN 81 MG PO TBEC: 81.0000 mg | DELAYED_RELEASE_TABLET | Freq: Every day | ORAL | 0 refills | Status: AC

## 2017-06-18 NOTE — Care Management Important Message (Signed)
Important Message  Patient Details  Name: SUMIYA MAMARIL MRN: [id number] Date of Birth: 09/16/1918   Medicare Important Message Given:  Yes    Kerin Salen 06/18/2017, 12:27 [place] Message  Patient Details  Name: DELORES EDELSTEIN MRN: [id number] Date of Birth: 1919/05/05   Medicare Important Message Given:  Yes    Kerin Salen 06/18/2017, 12:27 PM

## 2017-06-18 NOTE — Progress Notes (Addendum)
CSW was notified that pt is resident of [facility] ALF memory care unit. Medically stable for DC.  Spoke with pt's daughter Manuela Schwartz and with [employer]. Both report at baseline pt is independent with ADLs, needs prompting at times, is able to transfer out of bed and uses walker independently. Cognition- daughter and ALF report pt generally is oriented to person and vaguely to place ("will say she is in [place] but doesn't know more than that"). Does not wander or exit-seek. Able to follow prompts and commands without difficulty. Able to recognize her family and caregivers. Daughter and ALF expressed concern that pt needs to be evaluated by therapy in order to gauge her ability to ambulate currently in order to determine if ALF is appropriate at DC v. SNF for rehab prior to returning home.  Discussed with RN and will update attending.   Will follow and assist with DC to appropriate venue.  Sharren Bridge, MSW, LCSW Clinical Social Work 06/18/2017 [phone]  17:17- Discussed PT recommendation with daughter. She states she is agreeable to SNF referrals-preference for [facility]. Left voicemail for Tiffany at [facility] in order to confirm plan for SNF versus return directly to ALF (per daughter, if ALF reviews ambulation needs and feels they can meet her needs at ALF, she would be agreeable to that instead) CSW updated PASSR#, completed FL2 and made referrals to [facility] and other are SNFs.  Will need [employer] medicare pre-authorization to admit to SNF. Can begin request once facility identified.

## 2017-06-18 NOTE — NC FL2 (Signed)
[place] LEVEL OF CARE SCREENING TOOL     IDENTIFICATION  Patient Name: [name] Birthdate: 1918-10-09 Sex: female Admission Date (Current Location): 06/12/2017  [facility] and [place] Number:  Herbalist and Address:  [facility],  [place] [address], [place]      Provider Number: [id number]  Attending Physician Name and Address:  Jani Gravel, MD  Relative Name and Phone Number:       Current Level of Care: Hospital Recommended Level of Care: [place] Prior Approval Number:    Date Approved/Denied:   PASRR Number: [id number]  Discharge Plan: SNF    Current Diagnoses: Patient Active Problem List   Diagnosis Date Noted  . Cholangitis   . Choledocholithiasis   . Fever 06/12/2017  . ADENOCARCINOMA, OVARY 07/14/2008  . ANEMIA 07/14/2008  . DEPRESSION 07/14/2008  . PERIPHERAL VASCULAR DISEASE 07/14/2008  . GERD 07/14/2008  . MELENA 07/14/2008  . OSTEOARTHRITIS 07/14/2008  . OSTEOPOROSIS 07/14/2008  . PANCREATITIS, HX OF 07/14/2008  . ESOPHAGITIS, HX OF 07/14/2008  . HYPERLIPIDEMIA TYPE IIB / III 04/16/2008  . HYPERTENSION, BENIGN 04/16/2008  . LIMB PAIN 04/16/2008  . CHEST PAIN-UNSPECIFIED 04/16/2008    Orientation RESPIRATION BLADDER Height & Weight     Self, Place  Normal Continent Weight: 130 lb 8 oz (59.2 kg) Height:  5\' 7"  (170.2 cm)  BEHAVIORAL SYMPTOMS/MOOD NEUROLOGICAL BOWEL NUTRITION STATUS      Continent Diet(regular diet)  AMBULATORY STATUS COMMUNICATION OF NEEDS Skin   Limited Assist Verbally Normal                       Personal Care Assistance Level of Assistance  Bathing, Feeding, Dressing Bathing Assistance: Limited assistance Feeding assistance: Independent Dressing Assistance: Limited assistance     Functional Limitations Info  Sight, Hearing, Speech Sight Info: Adequate Hearing Info: Adequate Speech Info: Adequate    SPECIAL CARE FACTORS FREQUENCY  PT (By  licensed PT), OT (By licensed OT)     PT Frequency: 5x OT Frequency: 5x            Contractures Contractures Info: Not present    Additional Factors Info  Code Status, Allergies Code Status Info: DNR Allergies Info: hydrocodone           Current Medications (06/18/2017):  This is the current hospital active medication list Current Facility-Administered Medications  Medication Dose Route Frequency Provider Last Rate Last Dose  . acetaminophen (TYLENOL) tablet 650 mg  650 mg Oral Q6H PRN Jani Gravel, MD       Or  . acetaminophen (TYLENOL) suppository 650 mg  650 mg Rectal Q6H PRN Jani Gravel, MD      . amLODipine (NORVASC) tablet 2.5 mg  2.5 mg Oral Daily Jani Gravel, MD   2.5 mg at 06/18/17 0935  . amoxicillin-clavulanate (AUGMENTIN) 875-125 MG per tablet 1 tablet  1 tablet Oral Q12H Jani Gravel, MD   1 tablet at 06/18/17 0934  . aspirin EC tablet 81 mg  81 mg Oral Daily Jani Gravel, MD   81 mg at 06/18/17 0934  . docusate sodium (COLACE) capsule 100 mg  100 mg Oral Daily Jani Gravel, MD   100 mg at 06/18/17 0934  . feeding supplement (PRO-STAT SUGAR FREE 64) liquid 30 mL  30 mL Oral BID Jani Gravel, MD   30 mL at 06/18/17 0937  . hydrALAZINE (APRESOLINE) injection 5 mg  5 mg Intravenous Q6H PRN Jani Gravel,  MD   5 mg at 06/17/17 0538  . indomethacin (INDOCIN) 50 MG suppository 100 mg  100 mg Rectal Once Willia Craze, NP      . iopamidol (ISOVUE-300) 61 % injection 30 mL  30 mL Oral Once PRN Jani Gravel, MD      . lactose free nutrition (BOOST PLUS) liquid 237 mL  237 mL Oral BID BM Jani Gravel, MD   237 mL at 06/18/17 0939  . metoprolol tartrate (LOPRESSOR) injection 2.5 mg  2.5 mg Intravenous Q6H PRN Jani Gravel, MD      . metoprolol tartrate (LOPRESSOR) tablet 25 mg  25 mg Oral BID Jani Gravel, MD   25 mg at 06/18/17 0934  . saccharomyces boulardii (FLORASTOR) capsule 250 mg  250 mg Oral BID Jani Gravel, MD   250 mg at 06/18/17 6629     Discharge Medications: Please see discharge  summary for a list of discharge medications.  Relevant Imaging Results:  Relevant Lab Results:   Additional Information SS [id number]  Nila Nephew, LCSW

## 2017-06-18 NOTE — Discharge Summary (Signed)
[name], is a 84 y.o. female  DOB 1918/10/11  MRN [id number].  Admission date:  06/12/2017  Admitting Physician  Jani Gravel, MD  Discharge Date:  06/18/2017   Primary MD  [name], MD  Recommendations for primary care physician for things to follow:    Abnormal liver functionstable pt has cholelithiasis, ERCP2/8 with sphincterotomy cmpin 1 week  ? Fever prior to admission Blood culture + E. Coli.  Aztreonam=> Zosynpharmacy to dose(2/7=> 2/10) DC Zosyn 2/10 Cont Augmentin 875mg  po bid (2/10=>) for the next 5 days Cont Florastor  Troponin elevation(EF 60-65%) Chronic, stable Cardiac echo 06/13/2017=> EF 60-65%, moderate AR, mod pulm hypertension  Afib with RVR (EF 60-65%) New overnite (Chads2vasc=4) Cont Metoprolol 25mg  po bid  Start aspirin 81mg  po qday 2/11 Too much fall risk for conventional anticoagulation due to dementia and hx of falls  Hypertension Cont amlodipine 2.5mg  po qday   Compression fracture T10, T12 MRI T spine(stable, old fracture)  Severe protein calorie malnutrition prostat  PVD Resume [place]        Admission Diagnosis  fever   Discharge Diagnosis  fever   Active Problems:   Fever   Cholangitis   Choledocholithiasis    Afib with RVR Severe Protein Calorie Malnutrition  Past Medical History:  Diagnosis Date  . Aortic regurgitation   . Atrial fibrillation ([place])   . Dementia   . Depression   . Gallstone pancreatitis   . Hyperlipidemia   . Hypertension   . Ovarian cancer [facility])     Past Surgical History:  Procedure Laterality Date  . ABDOMINAL HYSTERECTOMY    . ERCP N/A 06/14/2017   Procedure: ENDOSCOPIC RETROGRADE CHOLANGIOPANCREATOGRAPHY (ERCP);  Surgeon: Doran Stabler, MD;  Location: [facility];  Service: Gastroenterology;  Laterality: N/A;       HPI  from the history and physical done on the  day of admission:   Maria Brownfemale,w dementia, hypertension, hyperlipidemia, depression, ? Circulation issues that apparently presents with c/o generalized weakness for the past 1 week. Subjective fever. Pt denies cough, cp, palp, sob, n/v, abd pain, diarrhea, brbpr, black stool. + weight loss.   Pt has trop 0.08, and markedly abnormal liver function. Pt denies new medication. Pt denies any risk for viral hepatitis.         Hospital Course:       Pt was admitted for fever and weakness.  Pt was found to markedly abnormal liver function.  A Ct scan was performed and showed evidence of choledocholithiasis. Pt blood culture 2/6 grew E. Coli.  Pt initially started on Aztreonam and then converted to Zosyn. Cardiology consulted for Afib with RVR. Pt was started on metoprolol 2.5mg  iv q6h and amiodarone iv.   GI consulted.  ERCP on 2/8 with sphinterotomy.  Pt still continued to have some high hr. After trnasitioned to oral metoprolol and therefore metoprolol increased to 25mg  po bid. Pt seems to be doing well on this dose.  Amiodarone has been off for 24  hours and no afib with RVR. Pt liver function trending down. Pt eating well and without complaints.    Follow UP  Follow-up Information    [name], MD Follow up in 1 week(s).   Specialty:  Internal Medicine Contact information: [place], [place] [address] [phone]            Consults obtained -cardiology , GI  Discharge Condition: stable  Diet and Activity recommendation: See Discharge Instructions below  Discharge Instructions        Discharge Medications     Allergies as of 06/18/2017      Reactions   Hydrocodone       Medication List    STOP taking these medications   atenolol 50 MG tablet Commonly known as:  TENORMIN   fentaNYL 25 MCG/HR patch Commonly known as:  DURAGESIC - dosed mcg/hr   guaiFENesin 600 MG 12 hr tablet Commonly known as:  MUCINEX     TAKE  these medications   alendronate 70 MG tablet Commonly known as:  FOSAMAX Take 70 mg by mouth once a week.   amLODipine 2.5 MG tablet Commonly known as:  NORVASC Take 2.5 mg by mouth daily.   amoxicillin-clavulanate 875-125 MG tablet Commonly known as:  AUGMENTIN Take 1 tablet by mouth every 12 (twelve) hours for 5 days.   aspirin 81 MG EC tablet Take 1 tablet (81 mg total) by mouth daily.   docusate sodium 100 MG capsule Commonly known as:  COLACE Take 1 capsule (100 mg total) by mouth daily.   donepezil 10 MG tablet Commonly known as:  ARICEPT Take 10 mg by mouth at bedtime.   ergocalciferol 50000 units capsule Commonly known as:  VITAMIN D2 Take 50,000 Units by mouth every 14 (fourteen) days.   feeding supplement (PRO-STAT SUGAR FREE 64) Liqd Take 30 mLs by mouth 2 (two) times daily.   feeding supplement Liqd Take 1 Container by mouth daily.   HYDROcodone-acetaminophen 5-325 MG tablet Commonly known as:  NORCO/VICODIN Take 1 tablet by mouth every 4 (four) hours as needed for moderate pain or severe pain.   metoprolol tartrate 25 MG tablet Commonly known as:  LOPRESSOR Take 1 tablet (25 mg total) by mouth 2 (two) times daily.   oyster calcium 500 MG Tabs tablet Take 500 mg of elemental calcium by mouth 2 (two) times daily.   pentoxifylline 400 MG CR tablet Commonly known as:  TRENTAL Take 400 mg by mouth daily.   pravastatin 80 MG tablet Commonly known as:  PRAVACHOL Take 80 mg by mouth daily.   saccharomyces boulardii 250 MG capsule Commonly known as:  FLORASTOR Take 1 capsule (250 mg total) by mouth 2 (two) times daily.   venlafaxine XR 75 MG 24 hr capsule Commonly known as:  EFFEXOR-XR Take 75 mg by mouth daily with breakfast.       Major procedures and Radiology Reports - PLEASE review detailed and final reports for all details, in brief -      Dg Chest 2 View  Result Date: 06/12/2017 CLINICAL DATA:  Fever EXAM: CHEST  2 VIEW COMPARISON:   06/12/2017, 09/10/2016 FINDINGS: Negative for heart failure. Atherosclerotic calcification aortic arch. Mild right pleural effusion with mild right lower lobe atelectasis. No definite pneumonia. Multiple mild chronic compression fractures in the thoracic spine with kyphosis. Advanced degenerative change left shoulder joint IMPRESSION: Mild right lower lobe atelectasis and small right effusion. No definite pneumonia. Electronically Signed   By: Franchot Gallo  M.D.   On: 06/12/2017 20:35   Ct Head Wo Contrast  Result Date: 06/12/2017 CLINICAL DATA:  Dementia EXAM: CT HEAD WITHOUT CONTRAST TECHNIQUE: Contiguous axial images were obtained from the base of the skull through the vertex without intravenous contrast. COMPARISON:  10/07/2011 FINDINGS: Brain: Moderate atrophy, with progression since 2013. Moderate chronic microvascular ischemic change in the white matter, with progression. Hypodensity left thalamus consistent with chronic infarct. Negative for acute hemorrhage or mass.  No midline shift Vascular: Negative for vascular thrombosis Skull: Negative Sinuses/Orbits: Negative Other: None IMPRESSION: Progression of atrophy and chronic microvascular ischemia since 2013. No acute abnormality. Electronically Signed   By: Franchot Gallo M.D.   On: 06/12/2017 20:40   Mr Thoracic Spine Wo Contrast  Result Date: 06/13/2017 CLINICAL DATA:  Thoracic spine fracture EXAM: MRI THORACIC SPINE WITHOUT CONTRAST TECHNIQUE: Multiplanar, multisequence MR imaging of the thoracic spine was performed. No intravenous contrast was administered. COMPARISON:  Chest radiograph 06/12/2017 FINDINGS: Alignment:  Increased thoracic kyphosis.  No static subluxation. Vertebrae: There is moderate height loss T9, T10 and T12 with depression of the superior endplates but no marrow edema. These findings were present on prior studies. Cord:  Normal signal and morphology. Paraspinal and other soft tissues: Large right and trace left pleural  effusions. Disc levels: No large disc herniation.  No spinal canal stenosis. IMPRESSION: 1. Multilevel chronic vertebral body height loss, particularly at the T9, T10 and T12 levels. This is unchanged compared to multiple old studies. No acute compression fracture. 2. No thoracic spinal canal stenosis or cord abnormality. 3. Large right and small left pleural effusions Electronically Signed   By: Ulyses Jarred M.D.   On: 06/13/2017 20:10   Ct Abdomen Pelvis W Contrast  Addendum Date: 06/13/2017   ADDENDUM REPORT: 06/13/2017 09:21 ADDENDUM: At least 2 stones in the distal common bile duct with prominent common bile duct wall enhancement. Consider cholangitis as cause of symptoms. These findings were discussed with Dr. Maudie Mercury. The 2 small low densities in the right liver could reflect developing biloma/abscess. No drainable collection. Electronically Signed   By: Monte Fantasia M.D.   On: 06/13/2017 09:21   Result Date: 06/13/2017 CLINICAL DATA:  Dementia generalize weakness fever positive weight loss abnormal liver function EXAM: CT ABDOMEN AND PELVIS WITH CONTRAST TECHNIQUE: Multidetector CT imaging of the abdomen and pelvis was performed using the standard protocol following bolus administration of intravenous contrast. CONTRAST:  133mL ISOVUE-300 IOPAMIDOL (ISOVUE-300) INJECTION 61% COMPARISON:  Radiograph 09/04/2016, CT chest 07/15/2008 FINDINGS: Lower chest: Small right-sided pleural effusion with partial atelectasis at the right base. The left lung base is clear. Borderline heart size. Coronary vascular calcification. Hepatobiliary: Heterogeneous liver parenchyma with vague hypoenhancing foci in the right lobe, for example series 2, image number 18 measuring 3 mm, and within the dome of the liver measuring approximately 1.9 cm. Surgical clips in the gallbladder fossa. No biliary dilatation. Pancreas: Unremarkable. No pancreatic ductal dilatation or surrounding inflammatory changes. Spleen: Normal in size  without focal abnormality. Adrenals/Urinary Tract: Adrenal glands are within normal limits. Diffuse cortical thinning of the kidneys. No hydronephrosis. The bladder is unremarkable Stomach/Bowel: Stomach is nonenlarged. No dilated small bowel. No colon wall thickening. Sigmoid colon diverticular disease. Vascular/Lymphatic: Extensive aortic atherosclerosis without aneurysmal dilatation. No significantly enlarged lymph nodes. Reproductive: Status post hysterectomy. No adnexal masses. Other: Negative for free air or free fluid. Musculoskeletal: Degenerative changes of the spine. Trace retrolisthesis of L1 on L2. Mild to moderate compression deformities at T12 and T10. IMPRESSION:  1. Coarse heterogeneous appearance of the liver with diffuse decreased density suggesting steatosis or hepatocellular disease. There are vague indeterminate hypoenhancing lesions within the dome of the liver and right lobe, for which nonemergent liver MRI could be helpful to further evaluate. There is no biliary dilatation in this post cholecystectomy patient. 2. Sigmoid colon diverticular disease without acute inflammation 3. Small right pleural effusion with partial atelectasis at the right base 4. Mild to moderate compression deformities at T10 and T12. Electronically Signed: By: Donavan Foil M.D. On: 06/12/2017 20:33   Dg Ercp With Sphincterotomy  Result Date: 06/15/2017 CLINICAL DATA:  84 year old female with choledocholithiasis EXAM: ERCP TECHNIQUE: Multiple spot images obtained with the fluoroscopic device and submitted for interpretation post-procedure. FLUOROSCOPY TIME:  Please see operative note for further detail. COMPARISON:  CT abdomen/pelvis 06/12/2017 FINDINGS: A total of 21 intraoperative saved images are submitted for review. The images depict a flexible endoscope in the descending duodenum with deep wire cannulation of the hepatic ducts. Initial cholangiogram demonstrates a dilated common bile duct with faceted filling  defects consistent with choledocholithiasis. The patient is status post cholecystectomy. Subsequent images document sphincterotomy and balloon sweeping of the common duct. IMPRESSION: 1. Choledocholithiasis. 2. ERCP with sphincterotomy and balloon sweep of the common duct. These images were submitted for radiologic interpretation only. Please see the procedural report for the amount of contrast and the fluoroscopy time utilized. Electronically Signed   By: Jacqulynn Cadet M.D.   On: 06/15/2017 09:15    Micro Results     Recent Results (from the past 240 hour(s))  Culture, blood (routine x 2)     Status: Abnormal   Collection Time: 06/12/17  5:23 PM  Result Value Ref Range Status   Specimen Description   Final    BLOOD BLOOD LEFT ARM Performed at [facility] [address] [place], [place] [address]    Special Requests   Final    BOTTLES DRAWN AEROBIC AND ANAEROBIC Blood Culture adequate volume Performed at [facility] [address] [place], [place] [address]    Culture  Setup Time   Final    GRAM NEGATIVE RODS IN BOTH AEROBIC AND ANAEROBIC BOTTLES CRITICAL RESULT CALLED TO, READ BACK BY AND VERIFIED WITH: Melodye Ped PHARMD, AT 5329 06/13/17 BY Rush Landmark Performed at [facility], [place] [address], [place], [place] [address]    Culture ESCHERICHIA COLI (A)  Final   Report Status 06/16/2017 FINAL  Final   Organism ID, Bacteria ESCHERICHIA COLI  Final      Susceptibility   Escherichia coli - MIC*    AMPICILLIN <=2 SENSITIVE Sensitive     CEFAZOLIN <=4 SENSITIVE Sensitive     CEFEPIME <=1 SENSITIVE Sensitive     CEFTAZIDIME <=1 SENSITIVE Sensitive     CEFTRIAXONE <=1 SENSITIVE Sensitive     CIPROFLOXACIN <=0.25 SENSITIVE Sensitive     GENTAMICIN <=1 SENSITIVE Sensitive     IMIPENEM <=0.25 SENSITIVE Sensitive     TRIMETH/SULFA <=20 SENSITIVE Sensitive     AMPICILLIN/SULBACTAM <=2 SENSITIVE Sensitive     PIP/TAZO <=4 SENSITIVE  Sensitive     Extended ESBL NEGATIVE Sensitive     * ESCHERICHIA COLI  Blood Culture ID Panel (Reflexed)     Status: Abnormal   Collection Time: 06/12/17  5:23 PM  Result Value Ref Range Status   Enterococcus species NOT DETECTED NOT DETECTED Final   Vancomycin resistance NOT DETECTED NOT DETECTED Final   Listeria monocytogenes NOT  DETECTED NOT DETECTED Final   Staphylococcus species NOT DETECTED NOT DETECTED Final   Staphylococcus aureus NOT DETECTED NOT DETECTED Final   Methicillin resistance NOT DETECTED NOT DETECTED Final   Streptococcus species NOT DETECTED NOT DETECTED Final   Streptococcus agalactiae NOT DETECTED NOT DETECTED Final   Streptococcus pneumoniae NOT DETECTED NOT DETECTED Final   Streptococcus pyogenes NOT DETECTED NOT DETECTED Final   Acinetobacter baumannii NOT DETECTED NOT DETECTED Final   Enterobacteriaceae species DETECTED (A) NOT DETECTED Final    Comment: CRITICAL RESULT CALLED TO, READ BACK BY AND VERIFIED WITH: J. GADHIA PHARMD, AT 0801 06/13/17 BY D. VANHOOK Enterobacteriaceae represent a large family of gram-negative bacteria, not a single organism.    Enterobacter cloacae complex NOT DETECTED NOT DETECTED Final   Escherichia coli DETECTED (A) NOT DETECTED Final    Comment: CRITICAL RESULT CALLED TO, READ BACK BY AND VERIFIED WITH: J. GADHIA PHARMD, AT 0801 06/13/17 BY D. VANHOOK    Klebsiella oxytoca NOT DETECTED NOT DETECTED Final   Klebsiella pneumoniae NOT DETECTED NOT DETECTED Final   Proteus species NOT DETECTED NOT DETECTED Final   Serratia marcescens NOT DETECTED NOT DETECTED Final   Carbapenem resistance NOT DETECTED NOT DETECTED Final   Haemophilus influenzae NOT DETECTED NOT DETECTED Final   Neisseria meningitidis NOT DETECTED NOT DETECTED Final   Pseudomonas aeruginosa NOT DETECTED NOT DETECTED Final   Candida albicans NOT DETECTED NOT DETECTED Final   Candida glabrata NOT DETECTED NOT DETECTED Final   Candida krusei NOT DETECTED NOT DETECTED  Final   Candida parapsilosis NOT DETECTED NOT DETECTED Final   Candida tropicalis NOT DETECTED NOT DETECTED Final    Comment: Performed at [facility], [place] [address] [place], [place] [address]  Culture, blood (routine x 2)     Status: Abnormal   Collection Time: 06/12/17  5:32 PM  Result Value Ref Range Status   Specimen Description   Final    BLOOD BLOOD RIGHT HAND Performed at [facility] [address] [place], [place] [address]    Special Requests   Final    BOTTLES DRAWN AEROBIC AND ANAEROBIC Blood Culture adequate volume Performed at [facility] [address] [place], [place] [address]    Culture  Setup Time   Final    GRAM NEGATIVE RODS IN BOTH AEROBIC AND ANAEROBIC BOTTLES CRITICAL VALUE NOTED.  VALUE IS CONSISTENT WITH PREVIOUSLY REPORTED AND CALLED VALUE.    Culture (A)  Final    ESCHERICHIA COLI SUSCEPTIBILITIES PERFORMED ON PREVIOUS CULTURE WITHIN THE LAST 5 DAYS. Performed at [facility], [place] [address] [place], [place] [address]    Report Status 06/16/2017 FINAL  Final  Urine culture     Status: Abnormal   Collection Time: 06/12/17  7:40 PM  Result Value Ref Range Status   Specimen Description   Final    URINE, CLEAN CATCH Performed at [facility], [place] [address], [place], [place] [address]    Special Requests   Final    NONE Performed at [facility], [place] [address] [place], [place] [address]    Culture MULTIPLE SPECIES PRESENT, SUGGEST RECOLLECTION (A)  Final   Report Status 06/14/2017 FINAL  Final  MRSA PCR Screening     Status: None   Collection Time: 06/14/17  5:42 AM  Result Value Ref Range Status   MRSA by PCR NEGATIVE NEGATIVE Final    Comment:  The GeneXpert MRSA Assay (FDA approved for NASAL specimens only), is one component of a comprehensive MRSA colonization surveillance program. It is not intended to diagnose MRSA infection nor to  guide or monitor treatment for MRSA infections. Performed at [facility], [place] [address], [place], [place] [address]        Today   Subjective    [name] today has no headache,no chest abdominal pain,no new weakness tingling or numbness, feels much better wants to go home today.    Objective   Blood pressure 129/60, pulse 65, temperature 98 F (36.7 C), temperature source Oral, resp. rate 18, height 5\' 7"  (1.702 m), weight 59.2 kg (130 lb 8 oz), SpO2 96 %.   Intake/Output Summary (Last 24 hours) at 06/18/2017 0742 Last data filed at 06/17/2017 1544 Gross per 24 hour  Intake 200 ml  Output 300 ml  Net -100 ml    Exam Awake Alert, Oriented x 3, No new F.N deficits, Normal affect Poth.AT,PERRAL Supple Neck,No JVD, No cervical lymphadenopathy appriciated.  Symmetrical Chest wall movement, Good air movement bilaterally, CTAB Irr, irr, s1, s2, 2/6 dm rusb +ve B.Sounds, Abd Soft, Non tender, No organomegaly appriciated, No rebound -guarding or rigidity. No Cyanosis, Clubbing or edema, No new Rash or bruise   Data Review   CBC w Diff:  Lab Results  Component Value Date   WBC 11.8 (H) 06/17/2017   HGB 13.7 06/17/2017   HGB 14.3 10/07/2011   HCT 41.3 06/17/2017   HCT 42.1 10/07/2011   PLT 343 06/17/2017   PLT 188 10/07/2011   LYMPHOPCT 6 (L) 01/23/2009   MONOPCT 3 01/23/2009   EOSPCT 0 01/23/2009   BASOPCT 0 01/23/2009    CMP:  Lab Results  Component Value Date   NA 138 06/18/2017   NA 143 10/07/2011   K 3.7 06/18/2017   K 4.0 10/07/2011   CL 103 06/18/2017   CL 103 10/07/2011   CO2 24 06/18/2017   CO2 30 10/07/2011   BUN 20 06/18/2017   BUN 21 (H) 10/07/2011   CREATININE 0.79 06/18/2017   CREATININE 0.76 10/07/2011   PROT 5.6 (L) 06/18/2017   PROT 7.2 10/07/2011   ALBUMIN 2.7 (L) 06/18/2017   ALBUMIN 3.8 10/07/2011   BILITOT 0.7 06/18/2017   BILITOT 0.3 10/07/2011   ALKPHOS 302 (H) 06/18/2017   ALKPHOS 66 10/07/2011   AST 52  (H) 06/18/2017   AST 22 10/07/2011   ALT 166 (H) 06/18/2017   ALT 17 10/07/2011  .   Total Time in preparing paper work, data evaluation and todays exam - 11 minutes  Jani Gravel M.D on 06/18/2017 at 7:42 AM

## 2017-06-18 NOTE — Progress Notes (Signed)
Patient ID: [name], female   DOB: Nov 11, 1918, 84 y.o.   MRN: [id number]                                                                PROGRESS NOTE                                                                                                                                                                                                             Patient Demographics:    [name], is a 84 y.o. female, DOB - 09-23-1918, MWN:[id number]  Admit date - 06/12/2017   Admitting Physician Jani Gravel, MD  Outpatient Primary MD for the patient is [name], MD  LOS - 6  Outpatient Specialists:     No chief complaint on file. Weakness, fever     Brief Narrative  84 y.o.female,w dementia, hypertension, hyperlipidemia, depression, ? Circulation issues that apparently presents with c/o generalized weakness for the past 1 week. Subjective fever. Pt denies cough, cp, palp, sob, n/v, abd pain, diarrhea, brbpr, black stool. + weight loss.   Pt has trop 0.08, and markedly abnormal liver function. Pt denies new medication. Pt denies any risk for viral hepatitis.        Subjective:    [name] today is still weak.  Her family has changed their mind. And request SNF rather than back to ALF>  Mental status at baseline.    No headache, No chest pain, No abdominal pain - No Nausea, No new weakness tingling or numbness, No Cough - SOB.    Assessment  & Plan :    Active Problems:   Fever   Cholangitis   Choledocholithiasis    Abnormal liver functionstable pt has cholelithiasis, ERCP2/8 with sphincterotomy Appreciate GI input Will follow LFT, improving cmpin am  ? Fever prior to admission Blood culture + E. Coli.  Aztreonam=> Zosynpharmacy to dose(2/7=> 2/10) DC Zosyn 2/10 Cont Augmentin 875mg  po bid (2/10=>) Cont Florastor  Troponin elevation(EF 60-65%) Chronic, stable Cardiac echo 06/13/2017=> EF 60-65%, moderate AR, mod pulm  hypertension  Afib with RVR (EF 60-65%) New overnite (Chads2vasc=4) IncreaseMetoprolol 25mg  po bid Metoprolol 2.5mg  iv q6h prn HR >100 persistent STOP Amiodarone 2/11 Cont aspirin 81mg  po qday Too much fall risk for conventional anticoagulation due to dementia and hx  of falls  Hypertension Cont amlodipine 2.5mg  po qday ContHydralazine 5mg  iv q6h prn sbp >160  Compression fracture T10, T12 MRI T spine(stable, old fracture) Cont norco  Severe protein calorie malnutrition prostat  Deconditioning Gait instability PT/OT to evaluate and tx, social work for SNF    Code Status:DNR  Family Communication :spoke with daughter yesterdayand updated her  Disposition Plan:SNF  Barriers For Discharge:  Consults :GI, cardiology  Procedures :   DVT Prophylaxis: Lovenox SCDs    Antibiotics :aztreonam2/7, zosyn 2/7=>2/10 Augmentin 2/10=>     Anti-infectives (From admission, onward)   Start     Dose/Rate Route Frequency Ordered Stop   06/16/17 1000  amoxicillin-clavulanate (AUGMENTIN) 875-125 MG per tablet 1 tablet     1 tablet Oral Every 12 hours 06/16/17 0621     06/13/17 1400  piperacillin-tazobactam (ZOSYN) IVPB 3.375 g  Status:  Discontinued     3.375 g 12.5 mL/hr over 240 Minutes Intravenous Every 8 hours 06/13/17 1251 06/16/17 0646   06/13/17 1000  aztreonam (AZACTAM) 1 GM IVPB  Status:  Discontinued     1 g 100 mL/hr over 30 Minutes Intravenous Every 8 hours 06/13/17 0900 06/13/17 1223   06/13/17 0830  aztreonam (AZACTAM) 1 g in dextrose 5 % 50 mL IVPB  Status:  Discontinued     1 g 100 mL/hr over 30 Minutes Intravenous Every 8 hours 06/13/17 0823 06/13/17 0900        Objective:   Vitals:   06/17/17 1048 06/17/17 1300 06/17/17 2201 06/18/17 0432  BP: 140/78 (!) 133/57 (!) 132/54 129/60  Pulse: (!) 118 65 64 65  Resp:  20 20 18   Temp:  97.8 F (36.6 C) 98 F (36.7 C) 98 F (36.7 C)  TempSrc:  Oral Oral Oral   SpO2:  96% 97% 96%  Weight:    59.2 kg (130 lb 8 oz)  Height:        Wt Readings from Last 3 Encounters:  06/18/17 59.2 kg (130 lb 8 oz)     Intake/Output Summary (Last 24 hours) at 06/18/2017 0734 Last data filed at 06/17/2017 1544 Gross per 24 hour  Intake 200 ml  Output 300 ml  Net -100 ml     Physical Exam  Awake Alert, Oriented X 3, No new F.N deficits, Normal affect [place].AT,PERRAL Supple Neck,No JVD, No cervical lymphadenopathy appriciated.  Symmetrical Chest wall movement, Good air movement bilaterally, CTAB Irr, irr s1, s2,  +ve B.Sounds, Abd Soft, No tenderness, No organomegaly appriciated, No rebound - guarding or rigidity. No Cyanosis, Clubbing or edema, No new Rash or bruise     Data Review:    CBC Recent Labs  Lab 06/13/17 1110 06/14/17 0421 06/15/17 0433 06/16/17 0451 06/17/17 0458  WBC 7.9 8.2 5.7 9.8 11.8*  HGB 11.6* 12.9 11.2* 12.6 13.7  HCT 35.7* 39.5 34.0* 38.7 41.3  PLT 245 285 254 303 343  MCV 90.2 90.2 89.5 89.6 89.4  MCH 29.3 29.5 29.5 29.2 29.7  MCHC 32.5 32.7 32.9 32.6 33.2  RDW 14.1 14.4 14.8 14.8 14.8    Chemistries  Recent Labs  Lab 06/14/17 0421 06/15/17 0433 06/16/17 0451 06/17/17 0458 06/18/17 0410  NA 140 140 140 134* 138  K 3.4* 3.8 3.5 3.2* 3.7  CL 103 107 108 101 103  CO2 28 26 24 22 24   GLUCOSE 148* 94 109* 114* 105*  BUN 16 14 15 9 20   CREATININE 0.89 0.77 0.82 0.63 0.79  CALCIUM 8.3* 7.8* 8.2* 8.3*  8.5*  MG  --   --   --  1.8  --   AST 720* 243* 108* 66* 52*  ALT 679* 455* 324* 230* 166*  ALKPHOS 605* 454* 430* 370* 302*  BILITOT 1.3* 0.7 0.5 0.4 0.7   ------------------------------------------------------------------------------------------------------------------ No results for input(s): CHOL, HDL, LDLCALC, TRIG, CHOLHDL, LDLDIRECT in the last 72 hours.  Lab Results  Component Value Date   HGBA1C 6.0 (H) 06/12/2017    ------------------------------------------------------------------------------------------------------------------ No results for input(s): TSH, T4TOTAL, T3FREE, THYROIDAB in the last 72 hours.  Invalid input(s): FREET3 ------------------------------------------------------------------------------------------------------------------ No results for input(s): VITAMINB12, FOLATE, FERRITIN, TIBC, IRON, RETICCTPCT in the last 72 hours.  Coagulation profile Recent Labs  Lab 06/13/17 1301  INR 1.15    No results for input(s): DDIMER in the last 72 hours.  Cardiac Enzymes Recent Labs  Lab 06/12/17 1723  06/14/17 0421 06/14/17 1014 06/14/17 1601  CKMB 8.6*  --   --   --   --   TROPONINI 0.08*   < > 0.06* 0.08* 0.06*   < > = values in this interval not displayed.   ------------------------------------------------------------------------------------------------------------------ No results found for: BNP  Inpatient Medications  Scheduled Meds: . amLODipine  2.5 mg Oral Daily  . amoxicillin-clavulanate  1 tablet Oral Q12H  . aspirin EC  81 mg Oral Daily  . docusate sodium  100 mg Oral Daily  . feeding supplement (PRO-STAT SUGAR FREE 64)  30 mL Oral BID  . indomethacin  100 mg Rectal Once  . lactose free nutrition  237 mL Oral BID BM  . metoprolol tartrate  25 mg Oral BID  . saccharomyces boulardii  250 mg Oral BID   Continuous Infusions: PRN Meds:.acetaminophen **OR** acetaminophen, hydrALAZINE, iopamidol, metoprolol tartrate  Micro Results Recent Results (from the past 240 hour(s))  Culture, blood (routine x 2)     Status: Abnormal   Collection Time: 06/12/17  5:23 PM  Result Value Ref Range Status   Specimen Description   Final    BLOOD BLOOD LEFT ARM Performed at [facility] [address], [place], [place] [address]    Special Requests   Final    BOTTLES DRAWN AEROBIC AND ANAEROBIC Blood Culture adequate volume Performed at [facility] [address] [place], [place] [address]    Culture  Setup Time   Final    GRAM NEGATIVE RODS IN BOTH AEROBIC AND ANAEROBIC BOTTLES CRITICAL RESULT CALLED TO, READ BACK BY AND VERIFIED WITH: Melodye Ped PHARMD, AT 8588 06/13/17 BY Rush Landmark Performed at [facility], [place] [address] [place], [place] [address]    Culture ESCHERICHIA COLI (A)  Final   Report Status 06/16/2017 FINAL  Final   Organism ID, Bacteria ESCHERICHIA COLI  Final      Susceptibility   Escherichia coli - MIC*    AMPICILLIN <=2 SENSITIVE Sensitive     CEFAZOLIN <=4 SENSITIVE Sensitive     CEFEPIME <=1 SENSITIVE Sensitive     CEFTAZIDIME <=1 SENSITIVE Sensitive     CEFTRIAXONE <=1 SENSITIVE Sensitive     CIPROFLOXACIN <=0.25 SENSITIVE Sensitive     GENTAMICIN <=1 SENSITIVE Sensitive     IMIPENEM <=0.25 SENSITIVE Sensitive     TRIMETH/SULFA <=20 SENSITIVE Sensitive     AMPICILLIN/SULBACTAM <=2 SENSITIVE Sensitive     PIP/TAZO <=4 SENSITIVE Sensitive     Extended ESBL NEGATIVE Sensitive     * ESCHERICHIA COLI  Blood Culture ID Panel (Reflexed)     Status:  Abnormal   Collection Time: 06/12/17  5:23 PM  Result Value Ref Range Status   Enterococcus species NOT DETECTED NOT DETECTED Final   Vancomycin resistance NOT DETECTED NOT DETECTED Final   Listeria monocytogenes NOT DETECTED NOT DETECTED Final   Staphylococcus species NOT DETECTED NOT DETECTED Final   Staphylococcus aureus NOT DETECTED NOT DETECTED Final   Methicillin resistance NOT DETECTED NOT DETECTED Final   Streptococcus species NOT DETECTED NOT DETECTED Final   Streptococcus agalactiae NOT DETECTED NOT DETECTED Final   Streptococcus pneumoniae NOT DETECTED NOT DETECTED Final   Streptococcus pyogenes NOT DETECTED NOT DETECTED Final   Acinetobacter baumannii NOT DETECTED NOT DETECTED Final   Enterobacteriaceae species DETECTED (A) NOT DETECTED Final    Comment: CRITICAL RESULT CALLED TO, READ BACK BY AND VERIFIED WITH: JScherrie November PHARMD, AT 0801 06/13/17 BY D. VANHOOK Enterobacteriaceae represent a large family of gram-negative bacteria, not a single organism.    Enterobacter cloacae complex NOT DETECTED NOT DETECTED Final   Escherichia coli DETECTED (A) NOT DETECTED Final    Comment: CRITICAL RESULT CALLED TO, READ BACK BY AND VERIFIED WITH: J. GADHIA PHARMD, AT 0801 06/13/17 BY D. VANHOOK    Klebsiella oxytoca NOT DETECTED NOT DETECTED Final   Klebsiella pneumoniae NOT DETECTED NOT DETECTED Final   Proteus species NOT DETECTED NOT DETECTED Final   Serratia marcescens NOT DETECTED NOT DETECTED Final   Carbapenem resistance NOT DETECTED NOT DETECTED Final   Haemophilus influenzae NOT DETECTED NOT DETECTED Final   Neisseria meningitidis NOT DETECTED NOT DETECTED Final   Pseudomonas aeruginosa NOT DETECTED NOT DETECTED Final   Candida albicans NOT DETECTED NOT DETECTED Final   Candida glabrata NOT DETECTED NOT DETECTED Final   Candida krusei NOT DETECTED NOT DETECTED Final   Candida parapsilosis NOT DETECTED NOT DETECTED Final   Candida tropicalis NOT DETECTED NOT DETECTED Final    Comment: Performed at [facility], [place] [address] [place], [place] [address]  Culture, blood (routine x 2)     Status: Abnormal   Collection Time: 06/12/17  5:32 PM  Result Value Ref Range Status   Specimen Description   Final    BLOOD BLOOD RIGHT HAND Performed at [facility] [address], [place], [place] [address]    Special Requests   Final    BOTTLES DRAWN AEROBIC AND ANAEROBIC Blood Culture adequate volume Performed at [facility] [address] [place], [place] [address]    Culture  Setup Time   Final    GRAM NEGATIVE RODS IN BOTH AEROBIC AND ANAEROBIC BOTTLES CRITICAL VALUE NOTED.  VALUE IS CONSISTENT WITH PREVIOUSLY REPORTED AND CALLED VALUE.    Culture (A)  Final    ESCHERICHIA COLI SUSCEPTIBILITIES PERFORMED ON PREVIOUS CULTURE WITHIN THE LAST 5  DAYS. Performed at [facility], [place] [address], [place], [place] [address]    Report Status 06/16/2017 FINAL  Final  Urine culture     Status: Abnormal   Collection Time: 06/12/17  7:40 PM  Result Value Ref Range Status   Specimen Description   Final    URINE, CLEAN CATCH Performed at [facility], [place] [address], [place], [place] [address]    Special Requests   Final    NONE Performed at [facility], [place] [address] [place], [place] [address]    Culture MULTIPLE SPECIES PRESENT, SUGGEST RECOLLECTION (A)  Final   Report Status 06/14/2017 FINAL  Final  MRSA PCR  Screening     Status: None   Collection Time: 06/14/17  5:42 AM  Result Value Ref Range Status   MRSA by PCR NEGATIVE NEGATIVE Final    Comment:        The GeneXpert MRSA Assay (FDA approved for NASAL specimens only), is one component of a comprehensive MRSA colonization surveillance program. It is not intended to diagnose MRSA infection nor to guide or monitor treatment for MRSA infections. Performed at [facility], [place] [address] [place], [place] [address]     Radiology Reports Dg Chest 2 View  Result Date: 06/12/2017 CLINICAL DATA:  Fever EXAM: CHEST  2 VIEW COMPARISON:  06/12/2017, 09/10/2016 FINDINGS: Negative for heart failure. Atherosclerotic calcification aortic arch. Mild right pleural effusion with mild right lower lobe atelectasis. No definite pneumonia. Multiple mild chronic compression fractures in the thoracic spine with kyphosis. Advanced degenerative change left shoulder joint IMPRESSION: Mild right lower lobe atelectasis and small right effusion. No definite pneumonia. Electronically Signed   By: Franchot Gallo M.D.   On: 06/12/2017 20:35   Ct Head Wo Contrast  Result Date: 06/12/2017 CLINICAL DATA:  Dementia EXAM: CT HEAD WITHOUT CONTRAST TECHNIQUE: Contiguous axial images were obtained from the base of the skull through the vertex  without intravenous contrast. COMPARISON:  10/07/2011 FINDINGS: Brain: Moderate atrophy, with progression since 2013. Moderate chronic microvascular ischemic change in the white matter, with progression. Hypodensity left thalamus consistent with chronic infarct. Negative for acute hemorrhage or mass.  No midline shift Vascular: Negative for vascular thrombosis Skull: Negative Sinuses/Orbits: Negative Other: None IMPRESSION: Progression of atrophy and chronic microvascular ischemia since 2013. No acute abnormality. Electronically Signed   By: Franchot Gallo M.D.   On: 06/12/2017 20:40   Mr Thoracic Spine Wo Contrast  Result Date: 06/13/2017 CLINICAL DATA:  Thoracic spine fracture EXAM: MRI THORACIC SPINE WITHOUT CONTRAST TECHNIQUE: Multiplanar, multisequence MR imaging of the thoracic spine was performed. No intravenous contrast was administered. COMPARISON:  Chest radiograph 06/12/2017 FINDINGS: Alignment:  Increased thoracic kyphosis.  No static subluxation. Vertebrae: There is moderate height loss T9, T10 and T12 with depression of the superior endplates but no marrow edema. These findings were present on prior studies. Cord:  Normal signal and morphology. Paraspinal and other soft tissues: Large right and trace left pleural effusions. Disc levels: No large disc herniation.  No spinal canal stenosis. IMPRESSION: 1. Multilevel chronic vertebral body height loss, particularly at the T9, T10 and T12 levels. This is unchanged compared to multiple old studies. No acute compression fracture. 2. No thoracic spinal canal stenosis or cord abnormality. 3. Large right and small left pleural effusions Electronically Signed   By: Ulyses Jarred M.D.   On: 06/13/2017 20:10   Ct Abdomen Pelvis W Contrast  Addendum Date: 06/13/2017   ADDENDUM REPORT: 06/13/2017 09:21 ADDENDUM: At least 2 stones in the distal common bile duct with prominent common bile duct wall enhancement. Consider cholangitis as cause of symptoms. These  findings were discussed with Dr. Maudie Mercury. The 2 small low densities in the right liver could reflect developing biloma/abscess. No drainable collection. Electronically Signed   By: Monte Fantasia M.D.   On: 06/13/2017 09:21   Result Date: 06/13/2017 CLINICAL DATA:  Dementia generalize weakness fever positive weight loss abnormal liver function EXAM: CT ABDOMEN AND PELVIS WITH CONTRAST TECHNIQUE: Multidetector CT imaging of the abdomen and pelvis was performed using the standard protocol following bolus administration of intravenous contrast. CONTRAST:  173mL ISOVUE-300 IOPAMIDOL (ISOVUE-300) INJECTION 61%  COMPARISON:  Radiograph 09/04/2016, CT chest 07/15/2008 FINDINGS: Lower chest: Small right-sided pleural effusion with partial atelectasis at the right base. The left lung base is clear. Borderline heart size. Coronary vascular calcification. Hepatobiliary: Heterogeneous liver parenchyma with vague hypoenhancing foci in the right lobe, for example series 2, image number 18 measuring 3 mm, and within the dome of the liver measuring approximately 1.9 cm. Surgical clips in the gallbladder fossa. No biliary dilatation. Pancreas: Unremarkable. No pancreatic ductal dilatation or surrounding inflammatory changes. Spleen: Normal in size without focal abnormality. Adrenals/Urinary Tract: Adrenal glands are within normal limits. Diffuse cortical thinning of the kidneys. No hydronephrosis. The bladder is unremarkable Stomach/Bowel: Stomach is nonenlarged. No dilated small bowel. No colon wall thickening. Sigmoid colon diverticular disease. Vascular/Lymphatic: Extensive aortic atherosclerosis without aneurysmal dilatation. No significantly enlarged lymph nodes. Reproductive: Status post hysterectomy. No adnexal masses. Other: Negative for free air or free fluid. Musculoskeletal: Degenerative changes of the spine. Trace retrolisthesis of L1 on L2. Mild to moderate compression deformities at T12 and T10. IMPRESSION: 1. Coarse  heterogeneous appearance of the liver with diffuse decreased density suggesting steatosis or hepatocellular disease. There are vague indeterminate hypoenhancing lesions within the dome of the liver and right lobe, for which nonemergent liver MRI could be helpful to further evaluate. There is no biliary dilatation in this post cholecystectomy patient. 2. Sigmoid colon diverticular disease without acute inflammation 3. Small right pleural effusion with partial atelectasis at the right base 4. Mild to moderate compression deformities at T10 and T12. Electronically Signed: By: Donavan Foil M.D. On: 06/12/2017 20:33   Dg Ercp With Sphincterotomy  Result Date: 06/15/2017 CLINICAL DATA:  84 year old female with choledocholithiasis EXAM: ERCP TECHNIQUE: Multiple spot images obtained with the fluoroscopic device and submitted for interpretation post-procedure. FLUOROSCOPY TIME:  Please see operative note for further detail. COMPARISON:  CT abdomen/pelvis 06/12/2017 FINDINGS: A total of 21 intraoperative saved images are submitted for review. The images depict a flexible endoscope in the descending duodenum with deep wire cannulation of the hepatic ducts. Initial cholangiogram demonstrates a dilated common bile duct with faceted filling defects consistent with choledocholithiasis. The patient is status post cholecystectomy. Subsequent images document sphincterotomy and balloon sweeping of the common duct. IMPRESSION: 1. Choledocholithiasis. 2. ERCP with sphincterotomy and balloon sweep of the common duct. These images were submitted for radiologic interpretation only. Please see the procedural report for the amount of contrast and the fluoroscopy time utilized. Electronically Signed   By: Jacqulynn Cadet M.D.   On: 06/15/2017 09:15    Time Spent in minutes  30   Jani Gravel M.D on 06/18/2017 at 7:34 AM  Between 7am to 7am - Pager - [phone]

## 2017-06-18 NOTE — Progress Notes (Signed)
Medications administered by student RN [id number] with supervision of Clinical Instructor Demarion Pondexter MSN, RN-BC or patient's assigned RN.   

## 2017-06-18 NOTE — Evaluation (Signed)
Physical Therapy Evaluation Patient Details Name: [name] MRN: [id number] DOB: 08-May-1918 Today's Date: 06/18/2017   History of Present Illness  84yo female presenting to the ED with complaints of increased generalized weakness of one week, fever. Received ERCP procedure with sphintecotomy on 06/14/17. Diagnosed with new A-fib during this hospital stay.  PMH dementia, depression, HTN.   Clinical Impression   Patient received in bed, sleeping but easily awoken, A&Ox2 and required encouragement to participate in PT this afternoon but ultimately agreeable. She requires MaxA for supine to sit transfer, as well as Mod-MaxA x2 for sit to stand with strong posterior lean initially but able to correct with extended time and MinA from therapist to shift weight forward. Once stabilized, she was able to ambulate approximately 50f with RW mostly with min guard, but does demonstrate occasional bouts of posterior LOB requiring MinA from PT to correct. Gait distance limited by fatigue. She was left in bed with all needs met, bed alarm activated this afternoon. Moving forward, due to significant differences between I-Mod(I) status and current level of function, she will likely benefit from short term rehab in the SNF setting prior to return to ALF.     Follow Up Recommendations SNF    Equipment Recommendations  None recommended by PT(defer to next venue )    Recommendations for Other Services       Precautions / Restrictions Precautions Precautions: Fall;Other (comment) Precaution Comments: dementia  Restrictions Weight Bearing Restrictions: No      Mobility  Bed Mobility Overal bed mobility: Needs Assistance Bed Mobility: Supine to Sit;Sit to Supine     Supine to sit: Max assist;HOB elevated Sit to supine: Min assist   General bed mobility comments: to bring legs around and trunk up, also to pivot at edge of bed   Transfers Overall transfer level: Needs assistance Equipment used:  Rolling walker (2 wheeled) Transfers: Sit to/from Stand Sit to Stand: Mod assist;+2 physical assistance;Max assist         General transfer comment: patient requires Mod-MaxA x2 for safe sit to stand with posterior lean noted initially, patient able to correct with MinA to shift weight forward from therapist   Ambulation/Gait Ambulation/Gait assistance: Min assist;Min guard Ambulation Distance (Feet): 50 Feet Assistive device: Rolling walker (2 wheeled) Gait Pattern/deviations: Step-through pattern;Decreased step length - right;Decreased step length - left;Leaning posteriorly;Trunk flexed     General Gait Details: for most of gait period patient able to ambulate with RW and min guard for safety; did have 2 occasional mild posterior losses of balance requiring MinA to correct . Gait distance limited by fatigue.   Stairs            Wheelchair Mobility    Modified Rankin (Stroke Patients Only)       Balance Overall balance assessment: Needs assistance Sitting-balance support: Bilateral upper extremity supported;Feet supported Sitting balance-Leahy Scale: Fair Sitting balance - Comments: required Mod-MaxA to initially reach midline, then able to stabilize with Min guard at midilne    Standing balance support: Bilateral upper extremity supported;During functional activity Standing balance-Leahy Scale: Fair Standing balance comment: reilant on UE support with RW, occasional posterior LOB requiring MinA                              Pertinent Vitals/Pain Pain Assessment: Faces Pain Score: 0-No pain Faces Pain Scale: No hurt Pain Intervention(s): Limited activity within patient's tolerance;Monitored during session    Home  Living Family/patient expects to be discharged to:: Unsure                 Additional Comments: patient is from an ALF where she was highly independent and mobile; per chart review/nursing report, family and ALF are concerned about  patient's level of mobility due to hospitalization     Prior Function Level of Independence: Independent with assistive device(s)         Comments: patient not able to provide history, information provided per chart review      Hand Dominance        Extremity/Trunk Assessment   Upper Extremity Assessment Upper Extremity Assessment: Defer to OT evaluation    Lower Extremity Assessment Lower Extremity Assessment: Generalized weakness    Cervical / Trunk Assessment Cervical / Trunk Assessment: Kyphotic  Communication   Communication: No difficulties  Cognition Arousal/Alertness: Awake/alert Behavior During Therapy: Agitated;Impulsive Overall Cognitive Status: History of cognitive impairments - at baseline                                        General Comments      Exercises     Assessment/Plan    PT Assessment Patient needs continued PT services  PT Problem List Decreased strength;Decreased mobility;Decreased safety awareness;Decreased coordination;Decreased activity tolerance;Decreased balance;Decreased knowledge of use of DME       PT Treatment Interventions DME instruction;Therapeutic activities;Gait training;Therapeutic exercise;Patient/family education;Stair training;Balance training;Functional mobility training;Neuromuscular re-education    PT Goals (Current goals can be found in the Care Plan section)  Acute Rehab PT Goals PT Goal Formulation: Patient unable to participate in goal setting    Frequency Min 3X/week   Barriers to discharge        Co-evaluation               AM-PAC PT "6 Clicks" Daily Activity  Outcome Measure Difficulty turning over in bed (including adjusting bedclothes, sheets and blankets)?: Unable Difficulty moving from lying on back to sitting on the side of the bed? : Unable Difficulty sitting down on and standing up from a chair with arms (e.g., wheelchair, bedside commode, etc,.)?: Unable Help needed  moving to and from a bed to chair (including a wheelchair)?: A Lot Help needed walking in hospital room?: A Little Help needed climbing 3-5 steps with a railing? : A Lot 6 Click Score: 10    End of Session Equipment Utilized During Treatment: Gait belt Activity Tolerance: Patient limited by fatigue Patient left: in bed;with bed alarm set Nurse Communication: Mobility status;Other (comment)(nurse tech asked to replace purewick ) PT Visit Diagnosis: Unsteadiness on feet (R26.81);Muscle weakness (generalized) (M62.81);Difficulty in walking, not elsewhere classified (R26.2);Other abnormalities of gait and mobility (R26.89)    Time: 1610-9604 PT Time Calculation (min) (ACUTE ONLY): 19 min   Charges:   PT Evaluation $PT Eval Moderate Complexity: 1 Mod     PT G Codes:        Deniece Ree PT, DPT, CBIS  Supplemental Physical Therapist [place]   Pager [phone]

## 2017-06-19 MED ORDER — METOPROLOL TARTRATE 25 MG PO TABS
25.0000 mg | ORAL_TABLET | Freq: Two times a day (BID) | ORAL | Status: DC
  Administered 2017-06-19 – 2017-06-21 (×4): 25 mg via ORAL
  Filled 2017-06-19 (×4): qty 1

## 2017-06-19 MED ORDER — METOPROLOL TARTRATE 25 MG PO TABS
37.5000 mg | ORAL_TABLET | Freq: Two times a day (BID) | ORAL | Status: DC
  Administered 2017-06-19: 37.5 mg via ORAL
  Filled 2017-06-19: qty 2

## 2017-06-19 MED ORDER — METOPROLOL TARTRATE 37.5 MG PO TABS: 37.5000 mg | ORAL_TABLET | Freq: Two times a day (BID) | ORAL | 0 refills | Status: DC

## 2017-06-19 NOTE — Progress Notes (Signed)
Patient ID: [name], female   DOB: 1919-04-29, 84 y.o.   MRN: [id number]                                                                PROGRESS NOTE                                                                                                                                                                                                             Patient Demographics:    [name], is a 84 y.o. female, DOB - 03-20-1919, OAC:[id number]  Admit date - 06/12/2017   Admitting Physician Jani Gravel, MD  Outpatient Primary MD for the patient is [name], MD  LOS - 7  Outpatient Specialists:  No chief complaint on file.  weakness, fever    Brief Narrative    84 y.o.female,w dementia, hypertension, hyperlipidemia, depression, ? Circulation issues that apparently presents with c/o generalized weakness for the past 1 week. Subjective fever. Pt denies cough, cp, palp, sob, n/v, abd pain, diarrhea, brbpr, black stool. + weight loss.   Pt has trop 0.08, and markedly abnormal liver function. Pt denies new medication. Pt denies any risk for viral hepatitis.      Subjective:    [name] today has been doing well over nite. No compliants her hr 77-96.  Her daughter wants her to go to rehab,  Awaiting placement  No headache, No chest pain, No abdominal pain - No Nausea, No new weakness tingling or numbness, No Cough - SOB.     Assessment  & Plan :    Active Problems:   Fever   Cholangitis   Choledocholithiasis      Abnormal liver functionstable pt has cholelithiasis, ERCP2/8 with sphincterotomy Appreciate GI input Will follow LFT, improving cmpin am  ? Fever prior to admission Blood culture + E. Coli.  Aztreonam=> Zosynpharmacy to dose(2/7=> 2/10) DC Zosyn2/10 ContAugmentin 875mg  po bid (2/10=>) ContFlorastor  Troponin elevation(EF 60-65%) Chronic, stable Cardiac echo 06/13/2017=> EF 60-65%, moderate AR, mod pulm  hypertension  Afib with RVR (EF 60-65%) New overnite (Chads2vasc=4) IncreaseMetoprolol37.5 mg po bid Metoprolol 2.5mg  iv q6h prn HR >100 persistent STOP Amiodarone 2/11 Cont aspirin 81mg  po qday Too much fall risk for conventional anticoagulation due to dementia and hx of falls  Hypertension Contamlodipine  2.5mg  po qday ContHydralazine 5mg  iv q6h prn sbp >160  Compression fracture T10, T12 MRI T spine(stable, old fracture) Cont norco  Severe protein calorie malnutrition prostat  Deconditioning Gait instability PT/OT to evaluate and tx, social work for SNF    Code Status:DNR  Family Communication :spoke with daughter yesterdayand updated her  Disposition Plan:SNF  Barriers For Discharge:  Consults :GI, cardiology  Procedures :   DVT Prophylaxis: Lovenox SCDs    Antibiotics :aztreonam2/7, zosyn 2/7=>2/10 Augmentin 2/10=>      Anti-infectives (From admission, onward)   Start     Dose/Rate Route Frequency Ordered Stop   06/18/17 0000  amoxicillin-clavulanate (AUGMENTIN) 875-125 MG tablet     1 tablet Oral Every 12 hours 06/18/17 0742 06/23/17 2359   06/16/17 1000  amoxicillin-clavulanate (AUGMENTIN) 875-125 MG per tablet 1 tablet     1 tablet Oral Every 12 hours 06/16/17 0621     06/13/17 1400  piperacillin-tazobactam (ZOSYN) IVPB 3.375 g  Status:  Discontinued     3.375 g 12.5 mL/hr over 240 Minutes Intravenous Every 8 hours 06/13/17 1251 06/16/17 0646   06/13/17 1000  aztreonam (AZACTAM) 1 GM IVPB  Status:  Discontinued     1 g 100 mL/hr over 30 Minutes Intravenous Every 8 hours 06/13/17 0900 06/13/17 1223   06/13/17 0830  aztreonam (AZACTAM) 1 g in dextrose 5 % 50 mL IVPB  Status:  Discontinued     1 g 100 mL/hr over 30 Minutes Intravenous Every 8 hours 06/13/17 0823 06/13/17 0900        Objective:   Vitals:   06/18/17 1137 06/18/17 1400 06/18/17 1949 06/19/17 0539  BP: (!) 131/59 (!) 154/56  (!) 180/62 (!) 164/70  Pulse: 81 77 86 87  Resp: 16 15 16 16   Temp:  98.4 F (36.9 C) 97.8 F (36.6 C) 97.8 F (36.6 C)  TempSrc:  Oral Oral Oral  SpO2:  99% 99% 93%  Weight:    56.2 kg (123 lb 14.4 oz)  Height:        Wt Readings from Last 3 Encounters:  06/19/17 56.2 kg (123 lb 14.4 oz)     Intake/Output Summary (Last 24 hours) at 06/19/2017 0944 Last data filed at 06/19/2017 0541 Gross per 24 hour  Intake 120 ml  Output 1700 ml  Net -1580 ml     Physical Exam  Awake Alert, Oriented X 3, No new F.N deficits, Normal affect [place].AT,PERRAL Supple Neck,No JVD, No cervical lymphadenopathy appriciated.  Symmetrical Chest wall movement, Good air movement bilaterally, CTAB Irr, irr, s1, s2,  +ve B.Sounds, Abd Soft, No tenderness, No organomegaly appriciated, No rebound - guarding or rigidity. No Cyanosis, Clubbing or edema, No new Rash or bruise      Data Review:    CBC Recent Labs  Lab 06/13/17 1110 06/14/17 0421 06/15/17 0433 06/16/17 0451 06/17/17 0458  WBC 7.9 8.2 5.7 9.8 11.8*  HGB 11.6* 12.9 11.2* 12.6 13.7  HCT 35.7* 39.5 34.0* 38.7 41.3  PLT 245 285 254 303 343  MCV 90.2 90.2 89.5 89.6 89.4  MCH 29.3 29.5 29.5 29.2 29.7  MCHC 32.5 32.7 32.9 32.6 33.2  RDW 14.1 14.4 14.8 14.8 14.8    Chemistries  Recent Labs  Lab 06/14/17 0421 06/15/17 0433 06/16/17 0451 06/17/17 0458 06/18/17 0410  NA 140 140 140 134* 138  K 3.4* 3.8 3.5 3.2* 3.7  CL 103 107 108 101 103  CO2 28 26 24 22 24   GLUCOSE 148* 94 109* 114*  105*  BUN 16 14 15 9 20   CREATININE 0.89 0.77 0.82 0.63 0.79  CALCIUM 8.3* 7.8* 8.2* 8.3* 8.5*  MG  --   --   --  1.8  --   AST 720* 243* 108* 66* 52*  ALT 679* 455* 324* 230* 166*  ALKPHOS 605* 454* 430* 370* 302*  BILITOT 1.3* 0.7 0.5 0.4 0.7   ------------------------------------------------------------------------------------------------------------------ No results for input(s): CHOL, HDL, LDLCALC, TRIG, CHOLHDL, LDLDIRECT in the last 72  hours.  Lab Results  Component Value Date   HGBA1C 6.0 (H) 06/12/2017   ------------------------------------------------------------------------------------------------------------------ No results for input(s): TSH, T4TOTAL, T3FREE, THYROIDAB in the last 72 hours.  Invalid input(s): FREET3 ------------------------------------------------------------------------------------------------------------------ No results for input(s): VITAMINB12, FOLATE, FERRITIN, TIBC, IRON, RETICCTPCT in the last 72 hours.  Coagulation profile Recent Labs  Lab 06/13/17 1301  INR 1.15    No results for input(s): DDIMER in the last 72 hours.  Cardiac Enzymes Recent Labs  Lab 06/12/17 1723  06/14/17 0421 06/14/17 1014 06/14/17 1601  CKMB 8.6*  --   --   --   --   TROPONINI 0.08*   < > 0.06* 0.08* 0.06*   < > = values in this interval not displayed.   ------------------------------------------------------------------------------------------------------------------ No results found for: BNP  Inpatient Medications  Scheduled Meds: . amLODipine  2.5 mg Oral Daily  . amoxicillin-clavulanate  1 tablet Oral Q12H  . aspirin EC  81 mg Oral Daily  . docusate sodium  100 mg Oral Daily  . feeding supplement (PRO-STAT SUGAR FREE 64)  30 mL Oral BID  . indomethacin  100 mg Rectal Once  . lactose free nutrition  237 mL Oral BID BM  . metoprolol tartrate  37.5 mg Oral BID  . saccharomyces boulardii  250 mg Oral BID   Continuous Infusions: PRN Meds:.acetaminophen **OR** acetaminophen, hydrALAZINE, iopamidol, metoprolol tartrate  Micro Results Recent Results (from the past 240 hour(s))  Culture, blood (routine x 2)     Status: Abnormal   Collection Time: 06/12/17  5:23 PM  Result Value Ref Range Status   Specimen Description   Final    BLOOD BLOOD LEFT ARM Performed at [facility] [address] [place], [place] [address]    Special Requests   Final    BOTTLES DRAWN AEROBIC  AND ANAEROBIC Blood Culture adequate volume Performed at [facility] [address], [place], [place] [address]    Culture  Setup Time   Final    GRAM NEGATIVE RODS IN BOTH AEROBIC AND ANAEROBIC BOTTLES CRITICAL RESULT CALLED TO, READ BACK BY AND VERIFIED WITH: Melodye Ped PHARMD, AT 2831 06/13/17 BY Rush Landmark Performed at [facility], [place] [address], [place], [place] [address]    Culture ESCHERICHIA COLI (A)  Final   Report Status 06/16/2017 FINAL  Final   Organism ID, Bacteria ESCHERICHIA COLI  Final      Susceptibility   Escherichia coli - MIC*    AMPICILLIN <=2 SENSITIVE Sensitive     CEFAZOLIN <=4 SENSITIVE Sensitive     CEFEPIME <=1 SENSITIVE Sensitive     CEFTAZIDIME <=1 SENSITIVE Sensitive     CEFTRIAXONE <=1 SENSITIVE Sensitive     CIPROFLOXACIN <=0.25 SENSITIVE Sensitive     GENTAMICIN <=1 SENSITIVE Sensitive     IMIPENEM <=0.25 SENSITIVE Sensitive     TRIMETH/SULFA <=20 SENSITIVE Sensitive     AMPICILLIN/SULBACTAM <=2 SENSITIVE Sensitive     PIP/TAZO <=4 SENSITIVE Sensitive     Extended  ESBL NEGATIVE Sensitive     * ESCHERICHIA COLI  Blood Culture ID Panel (Reflexed)     Status: Abnormal   Collection Time: 06/12/17  5:23 PM  Result Value Ref Range Status   Enterococcus species NOT DETECTED NOT DETECTED Final   Vancomycin resistance NOT DETECTED NOT DETECTED Final   Listeria monocytogenes NOT DETECTED NOT DETECTED Final   Staphylococcus species NOT DETECTED NOT DETECTED Final   Staphylococcus aureus NOT DETECTED NOT DETECTED Final   Methicillin resistance NOT DETECTED NOT DETECTED Final   Streptococcus species NOT DETECTED NOT DETECTED Final   Streptococcus agalactiae NOT DETECTED NOT DETECTED Final   Streptococcus pneumoniae NOT DETECTED NOT DETECTED Final   Streptococcus pyogenes NOT DETECTED NOT DETECTED Final   Acinetobacter baumannii NOT DETECTED NOT DETECTED Final   Enterobacteriaceae species DETECTED (A) NOT DETECTED Final     Comment: CRITICAL RESULT CALLED TO, READ BACK BY AND VERIFIED WITH: J. GADHIA PHARMD, AT 0801 06/13/17 BY D. VANHOOK Enterobacteriaceae represent a large family of gram-negative bacteria, not a single organism.    Enterobacter cloacae complex NOT DETECTED NOT DETECTED Final   Escherichia coli DETECTED (A) NOT DETECTED Final    Comment: CRITICAL RESULT CALLED TO, READ BACK BY AND VERIFIED WITH: J. GADHIA PHARMD, AT 0801 06/13/17 BY D. VANHOOK    Klebsiella oxytoca NOT DETECTED NOT DETECTED Final   Klebsiella pneumoniae NOT DETECTED NOT DETECTED Final   Proteus species NOT DETECTED NOT DETECTED Final   Serratia marcescens NOT DETECTED NOT DETECTED Final   Carbapenem resistance NOT DETECTED NOT DETECTED Final   Haemophilus influenzae NOT DETECTED NOT DETECTED Final   Neisseria meningitidis NOT DETECTED NOT DETECTED Final   Pseudomonas aeruginosa NOT DETECTED NOT DETECTED Final   Candida albicans NOT DETECTED NOT DETECTED Final   Candida glabrata NOT DETECTED NOT DETECTED Final   Candida krusei NOT DETECTED NOT DETECTED Final   Candida parapsilosis NOT DETECTED NOT DETECTED Final   Candida tropicalis NOT DETECTED NOT DETECTED Final    Comment: Performed at [facility], [place] [address] [place], [place] [address]  Culture, blood (routine x 2)     Status: Abnormal   Collection Time: 06/12/17  5:32 PM  Result Value Ref Range Status   Specimen Description   Final    BLOOD BLOOD RIGHT HAND Performed at [facility] [address], [place], [place] [address]    Special Requests   Final    BOTTLES DRAWN AEROBIC AND ANAEROBIC Blood Culture adequate volume Performed at [facility] [address], [place], [place] [address]    Culture  Setup Time   Final    GRAM NEGATIVE RODS IN BOTH AEROBIC AND ANAEROBIC BOTTLES CRITICAL VALUE NOTED.  VALUE IS CONSISTENT WITH PREVIOUSLY REPORTED AND CALLED VALUE.    Culture (A)  Final    ESCHERICHIA  COLI SUSCEPTIBILITIES PERFORMED ON PREVIOUS CULTURE WITHIN THE LAST 5 DAYS. Performed at [facility], [place] [address] [place], [place] [address]    Report Status 06/16/2017 FINAL  Final  Urine culture     Status: Abnormal   Collection Time: 06/12/17  7:40 PM  Result Value Ref Range Status   Specimen Description   Final    URINE, CLEAN CATCH Performed at [facility], [place] [address], [place],  [place] [address]    Special Requests   Final    NONE Performed at [facility], [place] [address] [place], [place] [address]  Culture MULTIPLE SPECIES PRESENT, SUGGEST RECOLLECTION (A)  Final   Report Status 06/14/2017 FINAL  Final  MRSA PCR Screening     Status: None   Collection Time: 06/14/17  5:42 AM  Result Value Ref Range Status   MRSA by PCR NEGATIVE NEGATIVE Final    Comment:        The GeneXpert MRSA Assay (FDA approved for NASAL specimens only), is one component of a comprehensive MRSA colonization surveillance program. It is not intended to diagnose MRSA infection nor to guide or monitor treatment for MRSA infections. Performed at [facility], [place] [address] [place], [place] [address]     Radiology Reports Dg Chest 2 View  Result Date: 06/12/2017 CLINICAL DATA:  Fever EXAM: CHEST  2 VIEW COMPARISON:  06/12/2017, 09/10/2016 FINDINGS: Negative for heart failure. Atherosclerotic calcification aortic arch. Mild right pleural effusion with mild right lower lobe atelectasis. No definite pneumonia. Multiple mild chronic compression fractures in the thoracic spine with kyphosis. Advanced degenerative change left shoulder joint IMPRESSION: Mild right lower lobe atelectasis and small right effusion. No definite pneumonia. Electronically Signed   By: Franchot Gallo M.D.   On: 06/12/2017 20:35   Ct Head Wo Contrast  Result Date: 06/12/2017 CLINICAL DATA:  Dementia EXAM: CT HEAD WITHOUT CONTRAST TECHNIQUE: Contiguous axial  images were obtained from the base of the skull through the vertex without intravenous contrast. COMPARISON:  10/07/2011 FINDINGS: Brain: Moderate atrophy, with progression since 2013. Moderate chronic microvascular ischemic change in the white matter, with progression. Hypodensity left thalamus consistent with chronic infarct. Negative for acute hemorrhage or mass.  No midline shift Vascular: Negative for vascular thrombosis Skull: Negative Sinuses/Orbits: Negative Other: None IMPRESSION: Progression of atrophy and chronic microvascular ischemia since 2013. No acute abnormality. Electronically Signed   By: Franchot Gallo M.D.   On: 06/12/2017 20:40   Mr Thoracic Spine Wo Contrast  Result Date: 06/13/2017 CLINICAL DATA:  Thoracic spine fracture EXAM: MRI THORACIC SPINE WITHOUT CONTRAST TECHNIQUE: Multiplanar, multisequence MR imaging of the thoracic spine was performed. No intravenous contrast was administered. COMPARISON:  Chest radiograph 06/12/2017 FINDINGS: Alignment:  Increased thoracic kyphosis.  No static subluxation. Vertebrae: There is moderate height loss T9, T10 and T12 with depression of the superior endplates but no marrow edema. These findings were present on prior studies. Cord:  Normal signal and morphology. Paraspinal and other soft tissues: Large right and trace left pleural effusions. Disc levels: No large disc herniation.  No spinal canal stenosis. IMPRESSION: 1. Multilevel chronic vertebral body height loss, particularly at the T9, T10 and T12 levels. This is unchanged compared to multiple old studies. No acute compression fracture. 2. No thoracic spinal canal stenosis or cord abnormality. 3. Large right and small left pleural effusions Electronically Signed   By: Ulyses Jarred M.D.   On: 06/13/2017 20:10   Ct Abdomen Pelvis W Contrast  Addendum Date: 06/13/2017   ADDENDUM REPORT: 06/13/2017 09:21 ADDENDUM: At least 2 stones in the distal common bile duct with prominent common bile duct  wall enhancement. Consider cholangitis as cause of symptoms. These findings were discussed with Dr. Maudie Mercury. The 2 small low densities in the right liver could reflect developing biloma/abscess. No drainable collection. Electronically Signed   By: Monte Fantasia M.D.   On: 06/13/2017 09:21   Result Date: 06/13/2017 CLINICAL DATA:  Dementia generalize weakness fever positive weight loss abnormal liver function EXAM: CT ABDOMEN AND PELVIS WITH CONTRAST TECHNIQUE: Multidetector CT imaging of the abdomen and pelvis  was performed using the standard protocol following bolus administration of intravenous contrast. CONTRAST:  174mL ISOVUE-300 IOPAMIDOL (ISOVUE-300) INJECTION 61% COMPARISON:  Radiograph 09/04/2016, CT chest 07/15/2008 FINDINGS: Lower chest: Small right-sided pleural effusion with partial atelectasis at the right base. The left lung base is clear. Borderline heart size. Coronary vascular calcification. Hepatobiliary: Heterogeneous liver parenchyma with vague hypoenhancing foci in the right lobe, for example series 2, image number 18 measuring 3 mm, and within the dome of the liver measuring approximately 1.9 cm. Surgical clips in the gallbladder fossa. No biliary dilatation. Pancreas: Unremarkable. No pancreatic ductal dilatation or surrounding inflammatory changes. Spleen: Normal in size without focal abnormality. Adrenals/Urinary Tract: Adrenal glands are within normal limits. Diffuse cortical thinning of the kidneys. No hydronephrosis. The bladder is unremarkable Stomach/Bowel: Stomach is nonenlarged. No dilated small bowel. No colon wall thickening. Sigmoid colon diverticular disease. Vascular/Lymphatic: Extensive aortic atherosclerosis without aneurysmal dilatation. No significantly enlarged lymph nodes. Reproductive: Status post hysterectomy. No adnexal masses. Other: Negative for free air or free fluid. Musculoskeletal: Degenerative changes of the spine. Trace retrolisthesis of L1 on L2. Mild to moderate  compression deformities at T12 and T10. IMPRESSION: 1. Coarse heterogeneous appearance of the liver with diffuse decreased density suggesting steatosis or hepatocellular disease. There are vague indeterminate hypoenhancing lesions within the dome of the liver and right lobe, for which nonemergent liver MRI could be helpful to further evaluate. There is no biliary dilatation in this post cholecystectomy patient. 2. Sigmoid colon diverticular disease without acute inflammation 3. Small right pleural effusion with partial atelectasis at the right base 4. Mild to moderate compression deformities at T10 and T12. Electronically Signed: By: Donavan Foil M.D. On: 06/12/2017 20:33   Dg Ercp With Sphincterotomy  Result Date: 06/15/2017 CLINICAL DATA:  84 year old female with choledocholithiasis EXAM: ERCP TECHNIQUE: Multiple spot images obtained with the fluoroscopic device and submitted for interpretation post-procedure. FLUOROSCOPY TIME:  Please see operative note for further detail. COMPARISON:  CT abdomen/pelvis 06/12/2017 FINDINGS: A total of 21 intraoperative saved images are submitted for review. The images depict a flexible endoscope in the descending duodenum with deep wire cannulation of the hepatic ducts. Initial cholangiogram demonstrates a dilated common bile duct with faceted filling defects consistent with choledocholithiasis. The patient is status post cholecystectomy. Subsequent images document sphincterotomy and balloon sweeping of the common duct. IMPRESSION: 1. Choledocholithiasis. 2. ERCP with sphincterotomy and balloon sweep of the common duct. These images were submitted for radiologic interpretation only. Please see the procedural report for the amount of contrast and the fluoroscopy time utilized. Electronically Signed   By: Jacqulynn Cadet M.D.   On: 06/15/2017 09:15    Time Spent in minutes  30   Jani Gravel M.D on 06/19/2017 at 9:44 AM  Between 7am to 7am - Pager - [phone]

## 2017-06-19 NOTE — Evaluation (Addendum)
Occupational Therapy Evaluation Patient Details Name: [name] MRN: [id number] DOB: [birth year] Today's Date: 06/19/2017    History of Present Illness 84yo female presenting to the ED with complaints of increased generalized weakness of one week, fever. Received ERCP procedure with sphintecotomy on 06/14/17. Diagnosed with new A-fib during this hospital stay.  PMH dementia, depression, HTN.    Clinical Impression   Pt was admitted for the above.  Per chart, she is from ALF and was mod I.  Pt needs a lot of encouragement and assistance at this time.  She was seen in 2 parts due to getting agitated and self-limited activities. Will follow in acute setting with min A level goals for toilet transfers and UB adls.     Follow Up Recommendations  SNF    Equipment Recommendations  3 in 1 bedside commode    Recommendations for Other Services       Precautions / Restrictions Precautions Precautions: Fall Precaution Comments: dementia  Restrictions Weight Bearing Restrictions: No      Mobility Bed Mobility         Supine to sit: Min assist Sit to supine: Min guard   General bed mobility comments: HOB raised and extra time  Transfers                 General transfer comment: not performed.  Pt only sat up approximately 10 seconds then brought feet back onto bed    Balance     Sitting balance-Leahy Scale: Fair                                     ADL either performed or assessed with clinical judgement   ADL Overall ADL's : Needs assistance/impaired Eating/Feeding: Set up;Sitting   Grooming: Set up;Sitting                                 General ADL Comments: pt seen in 2 parts. She got agitated earlier when I was asking her to mobilize. Returned and pt sat up at EOB briefly. She was difficult to engage in functional activities:  she ate a little and performed limited grooming.  Max to total A with most ADLs at this time due to  limited willingness to perform and fatique     Vision         Perception     Praxis      Pertinent Vitals/Pain Pain Assessment: No/denies pain     Hand Dominance     Extremity/Trunk Assessment Upper Extremity Assessment Upper Extremity Assessment: Generalized weakness           Communication Communication Communication: No difficulties   Cognition Arousal/Alertness: Awake/alert Behavior During Therapy: Agitated                                   General Comments: h/o cognitive impairments; no family available.  Pt oriented only to self   General Comments       Exercises     Shoulder Instructions      Home Living Family/patient expects to be discharged to:: Skilled nursing facility  Additional Comments: patient is from an ALF where she was highly independent and mobile; per chart review/nursing report, family and ALF are concerned about patient's level of mobility due to hospitalization       Prior Functioning/Environment Level of Independence: Independent with assistive device(s)        Comments: patient not able to provide history, information provided per chart review         OT Problem List: Decreased strength;Decreased activity tolerance;Impaired balance (sitting and/or standing);Decreased cognition;Decreased safety awareness      OT Treatment/Interventions: Self-care/ADL training;Neuromuscular education;Energy conservation;Cognitive remediation/compensation;Therapeutic activities;Patient/family education;Balance training    OT Goals(Current goals can be found in the care plan section) Acute Rehab OT Goals Patient Stated Goal: none stated OT Goal Formulation: Patient unable to participate in goal setting Time For Goal Achievement: 07/03/17 Potential to Achieve Goals: Fair ADL Goals Pt Will Transfer to Toilet: with min assist;bedside commode;stand pivot transfer Additional ADL Goal #1:  pt will perform UB adls with min A and 2 grooming tasks with set up seated Additional ADL Goal #2: pt will tolerate 20 minutes of light adls or exercise to increase endurance for adls  OT Frequency: Min 2X/week   Barriers to D/C:            Co-evaluation              AM-PAC PT "6 Clicks" Daily Activity     Outcome Measure Help from another person eating meals?: A Little Help from another person taking care of personal grooming?: A Little Help from another person toileting, which includes using toliet, bedpan, or urinal?: A Lot Help from another person bathing (including washing, rinsing, drying)?: A Lot Help from another person to put on and taking off regular upper body clothing?: A Lot Help from another person to put on and taking off regular lower body clothing?: Total 6 Click Score: 13   End of Session    Activity Tolerance: Patient limited by fatigue Patient left: in bed;with call bell/phone within reach  OT Visit Diagnosis: Muscle weakness (generalized) (M62.81)                Time: 1779-3903 (1418 - 1428 and 1302-1311) OT Time Calculation (min): 19 min Charges:  OT General Charges $OT Visit: 1 Visit OT Evaluation $OT Eval Moderate Complexity: 1 Mod G-Codes:     [place], OTR/L [id number] 06/19/2017  Dylanie Quesenberry 06/19/2017, 3:14 PM

## 2017-06-19 NOTE — Progress Notes (Signed)
Patient's HR dropping to 40s-50s at times. BP stable, 146/54. HR currently 66 on monitor. Patient alert, eating lunch. MD Maudie Mercury paged to be made aware. Awaiting response.

## 2017-06-19 NOTE — Progress Notes (Signed)
CSW contacted by patient's daughter and informed that their SNF selection is [facility].  CSW contacted [facility] and spoke with staff member Magda Paganini, staff confirmed ability to offer patient a bed and agreed to start patient's insurance authorization.  CSW will continue to follow and assist with discharge planning.  Abundio Miu, [place] Social Worker [facility] Cell#: [phone]

## 2017-06-19 NOTE — Progress Notes (Signed)
CSW contacted [facility] to inquire about their ability to meet patient's needs. Staff member Tiffany reported that patient will need SNF before returning to ALF.  CSW provided update to patient's daughter Manuela Schwartz) and provided bed offers. Patient's daughter agreed to select SNF and contact CSW with an update. CSW explained that patient's insurance required prior authorization, patient's daughter verbalized understanding.  CSW awaiting return call from patient's daughter with SNF selection.   CSW will continue to follow and assist with discharge planning.   Abundio Miu, [place] Social Worker [facility] Cell#: [phone]

## 2017-06-20 LAB — CBC
HEMATOCRIT: 41 % (ref 36.0–46.0)
HEMOGLOBIN: 13.2 g/dL (ref 12.0–15.0)
MCH: 29.4 pg (ref 26.0–34.0)
MCHC: 32.2 g/dL (ref 30.0–36.0)
MCV: 91.3 fL (ref 78.0–100.0)
PLATELETS: 323 10*3/uL (ref 150–400)
RBC: 4.49 MIL/uL (ref 3.87–5.11)
RDW: 14.7 % (ref 11.5–15.5)
WBC: 10 10*3/uL (ref 4.0–10.5)

## 2017-06-20 LAB — COMPREHENSIVE METABOLIC PANEL
ALK PHOS: 249 U/L — AB (ref 38–126)
ALT: 96 U/L — AB (ref 14–54)
AST: 31 U/L (ref 15–41)
Albumin: 2.9 g/dL — ABNORMAL LOW (ref 3.5–5.0)
Anion gap: 12 (ref 5–15)
BILIRUBIN TOTAL: 0.3 mg/dL (ref 0.3–1.2)
BUN: 20 mg/dL (ref 6–20)
CALCIUM: 8.7 mg/dL — AB (ref 8.9–10.3)
CHLORIDE: 103 mmol/L (ref 101–111)
CO2: 24 mmol/L (ref 22–32)
CREATININE: 0.82 mg/dL (ref 0.44–1.00)
GFR calc non Af Amer: 59 mL/min — ABNORMAL LOW (ref >60)
Glucose, Bld: 106 mg/dL — ABNORMAL HIGH (ref 65–99)
Potassium: 4 mmol/L (ref 3.5–5.1)
Sodium: 139 mmol/L (ref 135–145)
Total Protein: 6.3 g/dL — ABNORMAL LOW (ref 6.5–8.1)

## 2017-06-20 MED ORDER — METOPROLOL TARTRATE 25 MG PO TABS: 25.0000 mg | ORAL_TABLET | Freq: Two times a day (BID) | ORAL | 0 refills | Status: AC

## 2017-06-20 MED ORDER — AMLODIPINE BESYLATE 5 MG PO TABS: 5.0000 mg | ORAL_TABLET | Freq: Every day | ORAL | 0 refills | Status: AC

## 2017-06-20 MED ORDER — AMLODIPINE BESYLATE 5 MG PO TABS
5.0000 mg | ORAL_TABLET | Freq: Every day | ORAL | Status: DC
  Administered 2017-06-20 – 2017-06-21 (×2): 5 mg via ORAL
  Filled 2017-06-20 (×2): qty 1

## 2017-06-20 NOTE — Progress Notes (Signed)
Physical Therapy Treatment Patient Details Name: [name] MRN: [id number] DOB: 02-Feb-1919 Today's Date: 06/20/2017    History of Present Illness 84yo female presenting to the ED with complaints of increased generalized weakness of one week, fever. Received ERCP procedure with sphintecotomy on 06/14/17. Diagnosed with new A-fib during this hospital stay.  PMH dementia, depression, HTN.     PT Comments    Progressing slowly with mobility. Continue to recommend SNF.   Follow Up Recommendations  SNF     Equipment Recommendations  None recommended by PT(defer to next venue)    Recommendations for Other Services       Precautions / Restrictions Precautions Precautions: Fall Precaution Comments: dementia  Restrictions Weight Bearing Restrictions: No    Mobility  Bed Mobility Overal bed mobility: Needs Assistance Bed Mobility: Supine to Sit;Sit to Supine     Supine to sit: Mod assist Sit to supine: Min guard   General bed mobility comments: HOB raised and extra time. Assist for trunk.   Transfers Overall transfer level: Needs assistance Equipment used: Rolling walker (2 wheeled) Transfers: Sit to/from Stand Sit to Stand: Mod assist         General transfer comment: Assist to rise, stabilize/correct balance, control descent. Posterior bias. Stand pivot, bed to bsc, with RW. Multimodal cues for safety, technique.   Ambulation/Gait Ambulation/Gait assistance: Min assist Ambulation Distance (Feet): 5 Feet Assistive device: Rolling walker (2 wheeled) Gait Pattern/deviations: Step-through pattern;Decreased stride length     General Gait Details: Assist to stabilize and maneuver with RW. Unsteady. Multimodal cues for safety. Pt declined to walk any farther and requested to return to bed.   Stairs            Wheelchair Mobility    Modified Rankin (Stroke Patients Only)       Balance         Postural control: Posterior lean Standing balance support:  Bilateral upper extremity supported Standing balance-Leahy Scale: Poor Standing balance comment: reilant on UE support with RW, occasional posterior LOB requiring MinA                             Cognition Arousal/Alertness: Awake/alert Behavior During Therapy: Agitated Overall Cognitive Status: History of cognitive impairments - at baseline                                 General Comments: h/o cognitive impairments; no family available      Exercises      General Comments        Pertinent Vitals/Pain Pain Assessment: No/denies pain    Home Living                      Prior Function            PT Goals (current goals can now be found in the care plan section) Progress towards PT goals: Progressing toward goals    Frequency    Min 3X/week      PT Plan Current plan remains appropriate    Co-evaluation              AM-PAC PT "6 Clicks" Daily Activity  Outcome Measure  Difficulty turning over in bed (including adjusting bedclothes, sheets and blankets)?: Unable Difficulty moving from lying on back to sitting on the side of the bed? : Unable Difficulty sitting down on  and standing up from a chair with arms (e.g., wheelchair, bedside commode, etc,.)?: Unable Help needed moving to and from a bed to chair (including a wheelchair)?: A Lot Help needed walking in hospital room?: A Lot Help needed climbing 3-5 steps with a railing? : A Lot 6 Click Score: 9    End of Session Equipment Utilized During Treatment: Gait belt Activity Tolerance: Patient tolerated treatment well Patient left: in bed;with call bell/phone within reach;with bed alarm set   PT Visit Diagnosis: Difficulty in walking, not elsewhere classified (R26.2);Other abnormalities of gait and mobility (R26.89);Unsteadiness on feet (R26.81)     Time: 1696-7893 PT Time Calculation (min) (ACUTE ONLY): 19 min  Charges:  $Gait Training: 8-22 mins                     G Codes:          Weston Anna, MPT Pager: [phone]

## 2017-06-20 NOTE — Progress Notes (Signed)
Patient ID: [name], female   DOB: 08-29-1918, 84 y.o.   MRN: [id number]                                                                PROGRESS NOTE                                                                                                                                                                                                             Patient Demographics:    [name], is a 84 y.o. female, DOB - 1918/05/08, JFH:[id number]  Admit date - 06/12/2017   Admitting Physician Jani Gravel, MD  Outpatient Primary MD for the patient is [name], MD  LOS - 8  Outpatient Specialists:    No chief complaint on file.  fever, weakness    Brief Narrative   84 y.o.female,w dementia, hypertension, hyperlipidemia, depression, ? Circulation issues that apparently presents with c/o generalized weakness for the past 1 week. Subjective fever. Pt denies cough, cp, palp, sob, n/v, abd pain, diarrhea, brbpr, black stool. + weight loss.   Pt has trop 0.08, and markedly abnormal liver function. Pt denies new medication. Pt denies any risk for viral hepatitis.      Subjective:    [name] today has , been doing well overnite.  Hr 50's yesterday with increase in metoprolol to 37.5 to decreased back to 25mg  po bid.      No headache, No chest pain, No abdominal pain - No Nausea, No new weakness tingling or numbness, No Cough - SOB.    Assessment  & Plan :    Active Problems:   Fever   Cholangitis   Choledocholithiasis      Abnormal liver functionstable pt has cholelithiasis, ERCP2/8 with sphincterotomy Appreciate GI input Will follow LFT, improving cmpin am  ? Fever prior to admission Blood culture + E. Coli.  Aztreonam=> Zosynpharmacy to dose(2/7=> 2/10) DC Zosyn2/10 ContAugmentin 875mg  po bid (2/10=>) ContFlorastor  Troponin elevation(EF 60-65%) Chronic, stable Cardiac echo 06/13/2017=> EF 60-65%, moderate AR, mod pulm  hypertension  Afib with RVR (EF 60-65%) New overnite (Chads2vasc=4) IncreaseMetoprolol37.5 mg po bid Metoprolol 2.5mg  iv q6h prn HR >100 persistent STOP Amiodarone 2/11 Contaspirin 81mg  po qday Too much fall risk for conventional anticoagulation due to dementia and hx of falls  Hypertension Contamlodipine 2.5mg  po qday ContHydralazine 5mg  iv q6h prn sbp >160  Compression fracture T10, T12 MRI T spine(stable, old fracture) Cont norco  Severe protein calorie malnutrition prostat  Deconditioning Gait instability PT/OT to evaluate and tx, social work for SNF   Code Status:DNR  Family Communication :spoke with daughter yesterdayand updated her  Disposition Plan:SNF  Barriers For Discharge:  Consults :GI, cardiology  Procedures :   DVT Prophylaxis: Lovenox SCDs    Antibiotics :aztreonam2/7, zosyn 2/7=>2/10 Augmentin 2/10=>    . Anti-infectives (From admission, onward)   Start     Dose/Rate Route Frequency Ordered Stop   06/18/17 0000  amoxicillin-clavulanate (AUGMENTIN) 875-125 MG tablet     1 tablet Oral Every 12 hours 06/18/17 0742 06/23/17 2359   06/16/17 1000  amoxicillin-clavulanate (AUGMENTIN) 875-125 MG per tablet 1 tablet     1 tablet Oral Every 12 hours 06/16/17 0621     06/13/17 1400  piperacillin-tazobactam (ZOSYN) IVPB 3.375 g  Status:  Discontinued     3.375 g 12.5 mL/hr over 240 Minutes Intravenous Every 8 hours 06/13/17 1251 06/16/17 0646   06/13/17 1000  aztreonam (AZACTAM) 1 GM IVPB  Status:  Discontinued     1 g 100 mL/hr over 30 Minutes Intravenous Every 8 hours 06/13/17 0900 06/13/17 1223   06/13/17 0830  aztreonam (AZACTAM) 1 g in dextrose 5 % 50 mL IVPB  Status:  Discontinued     1 g 100 mL/hr over 30 Minutes Intravenous Every 8 hours 06/13/17 0823 06/13/17 0900        Objective:   Vitals:   06/19/17 1335 06/19/17 2104 06/20/17 0558 06/20/17 0722  BP: (!) 146/54 (!) 157/53 (!)  163/59 (!) 162/55  Pulse: 66 78 77 85  Resp: 18 18 16    Temp: 98 F (36.7 C) 98.1 F (36.7 C) 98.1 F (36.7 C)   TempSrc: Oral Oral Oral   SpO2: 94% 96% 99%   Weight:   55.2 kg (121 lb 11.1 oz)   Height:        Wt Readings from Last 3 Encounters:  06/20/17 55.2 kg (121 lb 11.1 oz)     Intake/Output Summary (Last 24 hours) at 06/20/2017 0816 Last data filed at 06/20/2017 0500 Gross per 24 hour  Intake 240 ml  Output 300 ml  Net -60 ml     Physical Exam  Awake Alert, Oriented X 2, No new F.N deficits, Normal affect [place].AT,PERRAL Supple Neck,No JVD, No cervical lymphadenopathy appriciated.  Symmetrical Chest wall movement, Good air movement bilaterally, CTAB Irr, irr, s1, s2,  +ve B.Sounds, Abd Soft, No tenderness, No organomegaly appriciated, No rebound - guarding or rigidity. No Cyanosis, Clubbing or edema, No new Rash or bruise      Data Review:    CBC Recent Labs  Lab 06/14/17 0421 06/15/17 0433 06/16/17 0451 06/17/17 0458 06/20/17 0458  WBC 8.2 5.7 9.8 11.8* 10.0  HGB 12.9 11.2* 12.6 13.7 13.2  HCT 39.5 34.0* 38.7 41.3 41.0  PLT 285 254 303 343 323  MCV 90.2 89.5 89.6 89.4 91.3  MCH 29.5 29.5 29.2 29.7 29.4  MCHC 32.7 32.9 32.6 33.2 32.2  RDW 14.4 14.8 14.8 14.8 14.7    Chemistries  Recent Labs  Lab 06/15/17 0433 06/16/17 0451 06/17/17 0458 06/18/17 0410 06/20/17 0458  NA 140 140 134* 138 139  K 3.8 3.5 3.2* 3.7 4.0  CL 107 108 101 103 103  CO2 26 24 22 24 24   GLUCOSE 94 109* 114* 105*  106*  BUN 14 15 9 20 20   CREATININE 0.77 0.82 0.63 0.79 0.82  CALCIUM 7.8* 8.2* 8.3* 8.5* 8.7*  MG  --   --  1.8  --   --   AST 243* 108* 66* 52* 31  ALT 455* 324* 230* 166* 96*  ALKPHOS 454* 430* 370* 302* 249*  BILITOT 0.7 0.5 0.4 0.7 0.3   ------------------------------------------------------------------------------------------------------------------ No results for input(s): CHOL, HDL, LDLCALC, TRIG, CHOLHDL, LDLDIRECT in the last 72 hours.  Lab  Results  Component Value Date   HGBA1C 6.0 (H) 06/12/2017   ------------------------------------------------------------------------------------------------------------------ No results for input(s): TSH, T4TOTAL, T3FREE, THYROIDAB in the last 72 hours.  Invalid input(s): FREET3 ------------------------------------------------------------------------------------------------------------------ No results for input(s): VITAMINB12, FOLATE, FERRITIN, TIBC, IRON, RETICCTPCT in the last 72 hours.  Coagulation profile Recent Labs  Lab 06/13/17 1301  INR 1.15    No results for input(s): DDIMER in the last 72 hours.  Cardiac Enzymes Recent Labs  Lab 06/14/17 0421 06/14/17 1014 06/14/17 1601  TROPONINI 0.06* 0.08* 0.06*   ------------------------------------------------------------------------------------------------------------------ No results found for: BNP  Inpatient Medications  Scheduled Meds: . amLODipine  5 mg Oral Daily  . amoxicillin-clavulanate  1 tablet Oral Q12H  . aspirin EC  81 mg Oral Daily  . docusate sodium  100 mg Oral Daily  . feeding supplement (PRO-STAT SUGAR FREE 64)  30 mL Oral BID  . indomethacin  100 mg Rectal Once  . lactose free nutrition  237 mL Oral BID BM  . metoprolol tartrate  25 mg Oral BID  . saccharomyces boulardii  250 mg Oral BID   Continuous Infusions: PRN Meds:.acetaminophen **OR** acetaminophen, hydrALAZINE, iopamidol, metoprolol tartrate  Micro Results Recent Results (from the past 240 hour(s))  Culture, blood (routine x 2)     Status: Abnormal   Collection Time: 06/12/17  5:23 PM  Result Value Ref Range Status   Specimen Description   Final    BLOOD BLOOD LEFT ARM Performed at [facility] [address], [place], [place] [address]    Special Requests   Final    BOTTLES DRAWN AEROBIC AND ANAEROBIC Blood Culture adequate volume Performed at [facility] [address], [place], [place]  [address]    Culture  Setup Time   Final    GRAM NEGATIVE RODS IN BOTH AEROBIC AND ANAEROBIC BOTTLES CRITICAL RESULT CALLED TO, READ BACK BY AND VERIFIED WITH: Melodye Ped PHARMD, AT 8938 06/13/17 BY Rush Landmark Performed at [facility], [place] [address], [place], [place] [address]    Culture ESCHERICHIA COLI (A)  Final   Report Status 06/16/2017 FINAL  Final   Organism ID, Bacteria ESCHERICHIA COLI  Final      Susceptibility   Escherichia coli - MIC*    AMPICILLIN <=2 SENSITIVE Sensitive     CEFAZOLIN <=4 SENSITIVE Sensitive     CEFEPIME <=1 SENSITIVE Sensitive     CEFTAZIDIME <=1 SENSITIVE Sensitive     CEFTRIAXONE <=1 SENSITIVE Sensitive     CIPROFLOXACIN <=0.25 SENSITIVE Sensitive     GENTAMICIN <=1 SENSITIVE Sensitive     IMIPENEM <=0.25 SENSITIVE Sensitive     TRIMETH/SULFA <=20 SENSITIVE Sensitive     AMPICILLIN/SULBACTAM <=2 SENSITIVE Sensitive     PIP/TAZO <=4 SENSITIVE Sensitive     Extended ESBL NEGATIVE Sensitive     * ESCHERICHIA COLI  Blood Culture ID Panel (Reflexed)     Status: Abnormal   Collection Time: 06/12/17  5:23 PM  Result Value Ref  Range Status   Enterococcus species NOT DETECTED NOT DETECTED Final   Vancomycin resistance NOT DETECTED NOT DETECTED Final   Listeria monocytogenes NOT DETECTED NOT DETECTED Final   Staphylococcus species NOT DETECTED NOT DETECTED Final   Staphylococcus aureus NOT DETECTED NOT DETECTED Final   Methicillin resistance NOT DETECTED NOT DETECTED Final   Streptococcus species NOT DETECTED NOT DETECTED Final   Streptococcus agalactiae NOT DETECTED NOT DETECTED Final   Streptococcus pneumoniae NOT DETECTED NOT DETECTED Final   Streptococcus pyogenes NOT DETECTED NOT DETECTED Final   Acinetobacter baumannii NOT DETECTED NOT DETECTED Final   Enterobacteriaceae species DETECTED (A) NOT DETECTED Final    Comment: CRITICAL RESULT CALLED TO, READ BACK BY AND VERIFIED WITH: J. GADHIA PHARMD, AT 0801 06/13/17 BY D. VANHOOK Enterobacteriaceae  represent a large family of gram-negative bacteria, not a single organism.    Enterobacter cloacae complex NOT DETECTED NOT DETECTED Final   Escherichia coli DETECTED (A) NOT DETECTED Final    Comment: CRITICAL RESULT CALLED TO, READ BACK BY AND VERIFIED WITH: J. GADHIA PHARMD, AT 0801 06/13/17 BY D. VANHOOK    Klebsiella oxytoca NOT DETECTED NOT DETECTED Final   Klebsiella pneumoniae NOT DETECTED NOT DETECTED Final   Proteus species NOT DETECTED NOT DETECTED Final   Serratia marcescens NOT DETECTED NOT DETECTED Final   Carbapenem resistance NOT DETECTED NOT DETECTED Final   Haemophilus influenzae NOT DETECTED NOT DETECTED Final   Neisseria meningitidis NOT DETECTED NOT DETECTED Final   Pseudomonas aeruginosa NOT DETECTED NOT DETECTED Final   Candida albicans NOT DETECTED NOT DETECTED Final   Candida glabrata NOT DETECTED NOT DETECTED Final   Candida krusei NOT DETECTED NOT DETECTED Final   Candida parapsilosis NOT DETECTED NOT DETECTED Final   Candida tropicalis NOT DETECTED NOT DETECTED Final    Comment: Performed at [facility], [place] [address], [place], [place] [address]  Culture, blood (routine x 2)     Status: Abnormal   Collection Time: 06/12/17  5:32 PM  Result Value Ref Range Status   Specimen Description   Final    BLOOD BLOOD RIGHT HAND Performed at [facility] [address] [place], [place] [address]    Special Requests   Final    BOTTLES DRAWN AEROBIC AND ANAEROBIC Blood Culture adequate volume Performed at [facility] [address] [place], [place] [address]    Culture  Setup Time   Final    GRAM NEGATIVE RODS IN BOTH AEROBIC AND ANAEROBIC BOTTLES CRITICAL VALUE NOTED.  VALUE IS CONSISTENT WITH PREVIOUSLY REPORTED AND CALLED VALUE.    Culture (A)  Final    ESCHERICHIA COLI SUSCEPTIBILITIES PERFORMED ON PREVIOUS CULTURE WITHIN THE LAST 5 DAYS. Performed at [facility], [place] [address], [place], [place]  [address]    Report Status 06/16/2017 FINAL  Final  Urine culture     Status: Abnormal   Collection Time: 06/12/17  7:40 PM  Result Value Ref Range Status   Specimen Description   Final    URINE, CLEAN CATCH Performed at [facility], [place] [address] [place], [place] [address]    Special Requests   Final    NONE Performed at [facility], [place] [address], [place], [place] [address]    Culture MULTIPLE SPECIES PRESENT, SUGGEST RECOLLECTION (A)  Final   Report Status 06/14/2017 FINAL  Final  MRSA PCR Screening     Status: None   Collection Time: 06/14/17  5:42 AM  Result Value Ref Range Status   MRSA by PCR NEGATIVE NEGATIVE Final    Comment:        The GeneXpert MRSA Assay (FDA approved for NASAL specimens only), is one component of a comprehensive MRSA colonization surveillance program. It is not intended to diagnose MRSA infection nor to guide or monitor treatment for MRSA infections. Performed at [facility], [place] [address], [place], [place] [address]     Radiology Reports Dg Chest 2 View  Result Date: 06/12/2017 CLINICAL DATA:  Fever EXAM: CHEST  2 VIEW COMPARISON:  06/12/2017, 09/10/2016 FINDINGS: Negative for heart failure. Atherosclerotic calcification aortic arch. Mild right pleural effusion with mild right lower lobe atelectasis. No definite pneumonia. Multiple mild chronic compression fractures in the thoracic spine with kyphosis. Advanced degenerative change left shoulder joint IMPRESSION: Mild right lower lobe atelectasis and small right effusion. No definite pneumonia. Electronically Signed   By: Franchot Gallo M.D.   On: 06/12/2017 20:35   Ct Head Wo Contrast  Result Date: 06/12/2017 CLINICAL DATA:  Dementia EXAM: CT HEAD WITHOUT CONTRAST TECHNIQUE: Contiguous axial images were obtained from the base of the skull through the vertex without intravenous contrast. COMPARISON:  10/07/2011 FINDINGS: Brain: Moderate  atrophy, with progression since 2013. Moderate chronic microvascular ischemic change in the white matter, with progression. Hypodensity left thalamus consistent with chronic infarct. Negative for acute hemorrhage or mass.  No midline shift Vascular: Negative for vascular thrombosis Skull: Negative Sinuses/Orbits: Negative Other: None IMPRESSION: Progression of atrophy and chronic microvascular ischemia since 2013. No acute abnormality. Electronically Signed   By: Franchot Gallo M.D.   On: 06/12/2017 20:40   Mr Thoracic Spine Wo Contrast  Result Date: 06/13/2017 CLINICAL DATA:  Thoracic spine fracture EXAM: MRI THORACIC SPINE WITHOUT CONTRAST TECHNIQUE: Multiplanar, multisequence MR imaging of the thoracic spine was performed. No intravenous contrast was administered. COMPARISON:  Chest radiograph 06/12/2017 FINDINGS: Alignment:  Increased thoracic kyphosis.  No static subluxation. Vertebrae: There is moderate height loss T9, T10 and T12 with depression of the superior endplates but no marrow edema. These findings were present on prior studies. Cord:  Normal signal and morphology. Paraspinal and other soft tissues: Large right and trace left pleural effusions. Disc levels: No large disc herniation.  No spinal canal stenosis. IMPRESSION: 1. Multilevel chronic vertebral body height loss, particularly at the T9, T10 and T12 levels. This is unchanged compared to multiple old studies. No acute compression fracture. 2. No thoracic spinal canal stenosis or cord abnormality. 3. Large right and small left pleural effusions Electronically Signed   By: Ulyses Jarred M.D.   On: 06/13/2017 20:10   Ct Abdomen Pelvis W Contrast  Addendum Date: 06/13/2017   ADDENDUM REPORT: 06/13/2017 09:21 ADDENDUM: At least 2 stones in the distal common bile duct with prominent common bile duct wall enhancement. Consider cholangitis as cause of symptoms. These findings were discussed with Dr. Maudie Mercury. The 2 small low densities in the right liver  could reflect developing biloma/abscess. No drainable collection. Electronically Signed   By: Monte Fantasia M.D.   On: 06/13/2017 09:21   Result Date: 06/13/2017 CLINICAL DATA:  Dementia generalize weakness fever positive weight loss abnormal liver function EXAM: CT ABDOMEN AND PELVIS WITH CONTRAST TECHNIQUE: Multidetector CT imaging of the abdomen and pelvis was performed using the standard protocol following bolus administration of intravenous contrast. CONTRAST:  153mL ISOVUE-300 IOPAMIDOL (ISOVUE-300) INJECTION 61% COMPARISON:  Radiograph 09/04/2016, CT chest 07/15/2008 FINDINGS: Lower chest: Small right-sided pleural  effusion with partial atelectasis at the right base. The left lung base is clear. Borderline heart size. Coronary vascular calcification. Hepatobiliary: Heterogeneous liver parenchyma with vague hypoenhancing foci in the right lobe, for example series 2, image number 18 measuring 3 mm, and within the dome of the liver measuring approximately 1.9 cm. Surgical clips in the gallbladder fossa. No biliary dilatation. Pancreas: Unremarkable. No pancreatic ductal dilatation or surrounding inflammatory changes. Spleen: Normal in size without focal abnormality. Adrenals/Urinary Tract: Adrenal glands are within normal limits. Diffuse cortical thinning of the kidneys. No hydronephrosis. The bladder is unremarkable Stomach/Bowel: Stomach is nonenlarged. No dilated small bowel. No colon wall thickening. Sigmoid colon diverticular disease. Vascular/Lymphatic: Extensive aortic atherosclerosis without aneurysmal dilatation. No significantly enlarged lymph nodes. Reproductive: Status post hysterectomy. No adnexal masses. Other: Negative for free air or free fluid. Musculoskeletal: Degenerative changes of the spine. Trace retrolisthesis of L1 on L2. Mild to moderate compression deformities at T12 and T10. IMPRESSION: 1. Coarse heterogeneous appearance of the liver with diffuse decreased density suggesting  steatosis or hepatocellular disease. There are vague indeterminate hypoenhancing lesions within the dome of the liver and right lobe, for which nonemergent liver MRI could be helpful to further evaluate. There is no biliary dilatation in this post cholecystectomy patient. 2. Sigmoid colon diverticular disease without acute inflammation 3. Small right pleural effusion with partial atelectasis at the right base 4. Mild to moderate compression deformities at T10 and T12. Electronically Signed: By: Donavan Foil M.D. On: 06/12/2017 20:33   Dg Ercp With Sphincterotomy  Result Date: 06/15/2017 CLINICAL DATA:  84 year old female with choledocholithiasis EXAM: ERCP TECHNIQUE: Multiple spot images obtained with the fluoroscopic device and submitted for interpretation post-procedure. FLUOROSCOPY TIME:  Please see operative note for further detail. COMPARISON:  CT abdomen/pelvis 06/12/2017 FINDINGS: A total of 21 intraoperative saved images are submitted for review. The images depict a flexible endoscope in the descending duodenum with deep wire cannulation of the hepatic ducts. Initial cholangiogram demonstrates a dilated common bile duct with faceted filling defects consistent with choledocholithiasis. The patient is status post cholecystectomy. Subsequent images document sphincterotomy and balloon sweeping of the common duct. IMPRESSION: 1. Choledocholithiasis. 2. ERCP with sphincterotomy and balloon sweep of the common duct. These images were submitted for radiologic interpretation only. Please see the procedural report for the amount of contrast and the fluoroscopy time utilized. Electronically Signed   By: Jacqulynn Cadet M.D.   On: 06/15/2017 09:15    Time Spent in minutes  30   Jani Gravel M.D on 06/20/2017 at 8:16 AM  Between 7am to 7am - Pager - [phone]

## 2017-06-21 DIAGNOSIS — I10 Essential (primary) hypertension: Secondary | ICD-10-CM | POA: Diagnosis not present

## 2017-06-21 DIAGNOSIS — B9620 Unspecified Escherichia coli [E. coli] as the cause of diseases classified elsewhere: Secondary | ICD-10-CM | POA: Diagnosis not present

## 2017-06-21 DIAGNOSIS — I272 Pulmonary hypertension, unspecified: Secondary | ICD-10-CM | POA: Diagnosis not present

## 2017-06-21 DIAGNOSIS — Z48815 Encounter for surgical aftercare following surgery on the digestive system: Secondary | ICD-10-CM | POA: Diagnosis not present

## 2017-06-21 DIAGNOSIS — K8032 Calculus of bile duct with acute cholangitis without obstruction: Secondary | ICD-10-CM | POA: Diagnosis not present

## 2017-06-21 DIAGNOSIS — I739 Peripheral vascular disease, unspecified: Secondary | ICD-10-CM | POA: Diagnosis not present

## 2017-06-21 DIAGNOSIS — Z7189 Other specified counseling: Secondary | ICD-10-CM | POA: Diagnosis not present

## 2017-06-21 DIAGNOSIS — I4891 Unspecified atrial fibrillation: Secondary | ICD-10-CM | POA: Diagnosis not present

## 2017-06-21 DIAGNOSIS — E43 Unspecified severe protein-calorie malnutrition: Secondary | ICD-10-CM | POA: Diagnosis not present

## 2017-06-21 DIAGNOSIS — K8051 Calculus of bile duct without cholangitis or cholecystitis with obstruction: Secondary | ICD-10-CM | POA: Diagnosis not present

## 2017-06-21 DIAGNOSIS — R509 Fever, unspecified: Secondary | ICD-10-CM | POA: Diagnosis not present

## 2017-06-21 DIAGNOSIS — J90 Pleural effusion, not elsewhere classified: Secondary | ICD-10-CM | POA: Diagnosis not present

## 2017-06-21 DIAGNOSIS — E785 Hyperlipidemia, unspecified: Secondary | ICD-10-CM | POA: Diagnosis not present

## 2017-06-21 DIAGNOSIS — K8030 Calculus of bile duct with cholangitis, unspecified, without obstruction: Secondary | ICD-10-CM | POA: Diagnosis not present

## 2017-06-21 DIAGNOSIS — K8309 Other cholangitis: Secondary | ICD-10-CM | POA: Diagnosis not present

## 2017-06-21 DIAGNOSIS — R7881 Bacteremia: Secondary | ICD-10-CM | POA: Diagnosis not present

## 2017-06-21 DIAGNOSIS — Z9049 Acquired absence of other specified parts of digestive tract: Secondary | ICD-10-CM | POA: Diagnosis not present

## 2017-06-21 DIAGNOSIS — E440 Moderate protein-calorie malnutrition: Secondary | ICD-10-CM | POA: Diagnosis not present

## 2017-06-21 DIAGNOSIS — A419 Sepsis, unspecified organism: Secondary | ICD-10-CM | POA: Diagnosis not present

## 2017-06-21 DIAGNOSIS — I480 Paroxysmal atrial fibrillation: Secondary | ICD-10-CM | POA: Diagnosis not present

## 2017-06-21 DIAGNOSIS — F0390 Unspecified dementia without behavioral disturbance: Secondary | ICD-10-CM | POA: Diagnosis not present

## 2017-06-21 DIAGNOSIS — K839 Disease of biliary tract, unspecified: Secondary | ICD-10-CM | POA: Diagnosis not present

## 2017-06-21 LAB — TSH: TSH: 5.196 u[IU]/mL — AB (ref 0.350–4.500)

## 2017-06-21 LAB — MAGNESIUM: MAGNESIUM: 2.2 mg/dL (ref 1.7–2.4)

## 2017-06-21 NOTE — Progress Notes (Signed)
Cental tele notified this RN of change in pt HR. RN performed EKG and found pt to be in AV block. MD on call notified. No new orders at this time. Will continue to monitor patient.

## 2017-06-21 NOTE — Discharge Summary (Signed)
[name], is a 84 y.o. female  DOB 04-Feb-1919  MRN [id number].  Admission date:  06/12/2017  Admitting Physician  Jani Gravel, MD  Discharge Date:  06/21/2017   Primary MD  [name], MD  Recommendations for primary care physician for things to follow:     Abnormal liver functionstable pt has cholelithiasis, ERCP2/8 with sphincterotomy cmpin 1 week  ? Fever prior to admission Blood culture + E. Coli.  Aztreonam=> Zosynpharmacy to dose(2/7=> 2/10) DC Zosyn2/10 ContAugmentin 875mg  po bid (2/10=>) for the next 5 days ContFlorastor  Troponin elevation(EF 60-65%) Chronic, stable Cardiac echo 06/13/2017=> EF 60-65%, moderate AR, mod pulm hypertension  Afib with RVR (EF 60-65%) New overnite (Chads2vasc=4) Cont Metoprolol25mg  po bid  Cont aspirin 81mg  po qday  Too much fall risk for conventional anticoagulation due to dementia and hx of falls  Hypertension Contamlodipine 5mg  po qday   Compression fracture T10, T12 MRI T spine(stable, old fracture)  Severe protein calorie malnutrition prostat  PVD Resume [place]      Admission Diagnosis  fever   Discharge Diagnosis  fever     Active Problems:   Fever   Cholangitis   Choledocholithiasis      Past Medical History:  Diagnosis Date  . Aortic regurgitation   . Atrial fibrillation ([place])   . Dementia   . Depression   . Gallstone pancreatitis   . Hyperlipidemia   . Hypertension   . Ovarian cancer [facility])     Past Surgical History:  Procedure Laterality Date  . ABDOMINAL HYSTERECTOMY    . ERCP N/A 06/14/2017   Procedure: ENDOSCOPIC RETROGRADE CHOLANGIOPANCREATOGRAPHY (ERCP);  Surgeon: Doran Stabler, MD;  Location: [facility];  Service: Gastroenterology;  Laterality: N/A;       HPI  from the history and physical done on the day of admission:     84 y.o.female,w dementia,  hypertension, hyperlipidemia, depression, ? Circulation issues that apparently presents with c/o generalized weakness for the past 1 week. Subjective fever. Pt denies cough, cp, palp, sob, n/v, abd pain, diarrhea, brbpr, black stool. + weight loss.   Pt has trop 0.08, and markedly abnormal liver function. Pt denies new medication. Pt denies any risk for viral hepatitis.         Hospital Course:        Pt was admitted for fever and weakness.  Pt was found to markedly abnormal liver function.  A Ct scan was performed and showed evidence of choledocholithiasis. Pt blood culture 2/6 grew E. Coli.  Pt initially started on Aztreonam and then converted to Zosyn. Cardiology consulted for Afib with RVR. Pt was started on metoprolol 2.5mg  iv q6h and amiodarone iv.   GI consulted.  ERCP on 2/8 with sphinterotomy.  Pt still continued to have some high hr. After trnasitioned to oral metoprolol and therefore metoprolol increased to 25mg  po bid. Pt seems to be doing well on this dose.  Amiodarone has been off for 24 hours and no afib with RVR. Pt  liver function trending down. Increase amlodipine to 5mg  po qday for better bp control.  Pt eating well and without complaints.       Follow UP  Follow-up Information    [name], MD Follow up in 1 week(s).   Specialty:  Internal Medicine Contact information: [place], [place] [address] [phone]            Consults obtained - cardiology , GI  Discharge Condition: stable  Diet and Activity recommendation: See Discharge Instructions below  Discharge Instructions      Discharge Medications     Allergies as of 06/21/2017      Reactions   Hydrocodone       Medication List    STOP taking these medications   atenolol 50 MG tablet Commonly known as:  TENORMIN   fentaNYL 25 MCG/HR patch Commonly known as:  DURAGESIC - dosed mcg/hr   guaiFENesin 600 MG 12 hr tablet Commonly known as:   MUCINEX     TAKE these medications   alendronate 70 MG tablet Commonly known as:  FOSAMAX Take 70 mg by mouth once a week.   amLODipine 5 MG tablet Commonly known as:  NORVASC Take 1 tablet (5 mg total) by mouth daily. What changed:    medication strength  how much to take   amoxicillin-clavulanate 875-125 MG tablet Commonly known as:  AUGMENTIN Take 1 tablet by mouth every 12 (twelve) hours for 5 days.   aspirin 81 MG EC tablet Take 1 tablet (81 mg total) by mouth daily.   docusate sodium 100 MG capsule Commonly known as:  COLACE Take 1 capsule (100 mg total) by mouth daily.   donepezil 10 MG tablet Commonly known as:  ARICEPT Take 10 mg by mouth at bedtime.   ergocalciferol 50000 units capsule Commonly known as:  VITAMIN D2 Take 50,000 Units by mouth every 14 (fourteen) days.   feeding supplement (PRO-STAT SUGAR FREE 64) Liqd Take 30 mLs by mouth 2 (two) times daily.   feeding supplement Liqd Take 1 Container by mouth daily.   HYDROcodone-acetaminophen 5-325 MG tablet Commonly known as:  NORCO/VICODIN Take 1 tablet by mouth every 4 (four) hours as needed for moderate pain or severe pain.   metoprolol tartrate 25 MG tablet Commonly known as:  LOPRESSOR Take 1 tablet (25 mg total) by mouth 2 (two) times daily.   oyster calcium 500 MG Tabs tablet Take 500 mg of elemental calcium by mouth 2 (two) times daily.   pentoxifylline 400 MG CR tablet Commonly known as:  TRENTAL Take 400 mg by mouth daily.   pravastatin 80 MG tablet Commonly known as:  PRAVACHOL Take 80 mg by mouth daily.   saccharomyces boulardii 250 MG capsule Commonly known as:  FLORASTOR Take 1 capsule (250 mg total) by mouth 2 (two) times daily.   venlafaxine XR 75 MG 24 hr capsule Commonly known as:  EFFEXOR-XR Take 75 mg by mouth daily with breakfast.       Major procedures and Radiology Reports - PLEASE review detailed and final reports for all details, in brief -      Dg  Chest 2 View  Result Date: 06/12/2017 CLINICAL DATA:  Fever EXAM: CHEST  2 VIEW COMPARISON:  06/12/2017, 09/10/2016 FINDINGS: Negative for heart failure. Atherosclerotic calcification aortic arch. Mild right pleural effusion with mild right lower lobe atelectasis. No definite pneumonia. Multiple mild chronic compression fractures in the thoracic spine with kyphosis. Advanced degenerative change left shoulder joint  IMPRESSION: Mild right lower lobe atelectasis and small right effusion. No definite pneumonia. Electronically Signed   By: Franchot Gallo M.D.   On: 06/12/2017 20:35   Ct Head Wo Contrast  Result Date: 06/12/2017 CLINICAL DATA:  Dementia EXAM: CT HEAD WITHOUT CONTRAST TECHNIQUE: Contiguous axial images were obtained from the base of the skull through the vertex without intravenous contrast. COMPARISON:  10/07/2011 FINDINGS: Brain: Moderate atrophy, with progression since 2013. Moderate chronic microvascular ischemic change in the white matter, with progression. Hypodensity left thalamus consistent with chronic infarct. Negative for acute hemorrhage or mass.  No midline shift Vascular: Negative for vascular thrombosis Skull: Negative Sinuses/Orbits: Negative Other: None IMPRESSION: Progression of atrophy and chronic microvascular ischemia since 2013. No acute abnormality. Electronically Signed   By: Franchot Gallo M.D.   On: 06/12/2017 20:40   Mr Thoracic Spine Wo Contrast  Result Date: 06/13/2017 CLINICAL DATA:  Thoracic spine fracture EXAM: MRI THORACIC SPINE WITHOUT CONTRAST TECHNIQUE: Multiplanar, multisequence MR imaging of the thoracic spine was performed. No intravenous contrast was administered. COMPARISON:  Chest radiograph 06/12/2017 FINDINGS: Alignment:  Increased thoracic kyphosis.  No static subluxation. Vertebrae: There is moderate height loss T9, T10 and T12 with depression of the superior endplates but no marrow edema. These findings were present on prior studies. Cord:  Normal signal  and morphology. Paraspinal and other soft tissues: Large right and trace left pleural effusions. Disc levels: No large disc herniation.  No spinal canal stenosis. IMPRESSION: 1. Multilevel chronic vertebral body height loss, particularly at the T9, T10 and T12 levels. This is unchanged compared to multiple old studies. No acute compression fracture. 2. No thoracic spinal canal stenosis or cord abnormality. 3. Large right and small left pleural effusions Electronically Signed   By: Ulyses Jarred M.D.   On: 06/13/2017 20:10   Ct Abdomen Pelvis W Contrast  Addendum Date: 06/13/2017   ADDENDUM REPORT: 06/13/2017 09:21 ADDENDUM: At least 2 stones in the distal common bile duct with prominent common bile duct wall enhancement. Consider cholangitis as cause of symptoms. These findings were discussed with Dr. Maudie Mercury. The 2 small low densities in the right liver could reflect developing biloma/abscess. No drainable collection. Electronically Signed   By: Monte Fantasia M.D.   On: 06/13/2017 09:21   Result Date: 06/13/2017 CLINICAL DATA:  Dementia generalize weakness fever positive weight loss abnormal liver function EXAM: CT ABDOMEN AND PELVIS WITH CONTRAST TECHNIQUE: Multidetector CT imaging of the abdomen and pelvis was performed using the standard protocol following bolus administration of intravenous contrast. CONTRAST:  115mL ISOVUE-300 IOPAMIDOL (ISOVUE-300) INJECTION 61% COMPARISON:  Radiograph 09/04/2016, CT chest 07/15/2008 FINDINGS: Lower chest: Small right-sided pleural effusion with partial atelectasis at the right base. The left lung base is clear. Borderline heart size. Coronary vascular calcification. Hepatobiliary: Heterogeneous liver parenchyma with vague hypoenhancing foci in the right lobe, for example series 2, image number 18 measuring 3 mm, and within the dome of the liver measuring approximately 1.9 cm. Surgical clips in the gallbladder fossa. No biliary dilatation. Pancreas: Unremarkable. No  pancreatic ductal dilatation or surrounding inflammatory changes. Spleen: Normal in size without focal abnormality. Adrenals/Urinary Tract: Adrenal glands are within normal limits. Diffuse cortical thinning of the kidneys. No hydronephrosis. The bladder is unremarkable Stomach/Bowel: Stomach is nonenlarged. No dilated small bowel. No colon wall thickening. Sigmoid colon diverticular disease. Vascular/Lymphatic: Extensive aortic atherosclerosis without aneurysmal dilatation. No significantly enlarged lymph nodes. Reproductive: Status post hysterectomy. No adnexal masses. Other: Negative for free air or free fluid. Musculoskeletal:  Degenerative changes of the spine. Trace retrolisthesis of L1 on L2. Mild to moderate compression deformities at T12 and T10. IMPRESSION: 1. Coarse heterogeneous appearance of the liver with diffuse decreased density suggesting steatosis or hepatocellular disease. There are vague indeterminate hypoenhancing lesions within the dome of the liver and right lobe, for which nonemergent liver MRI could be helpful to further evaluate. There is no biliary dilatation in this post cholecystectomy patient. 2. Sigmoid colon diverticular disease without acute inflammation 3. Small right pleural effusion with partial atelectasis at the right base 4. Mild to moderate compression deformities at T10 and T12. Electronically Signed: By: Donavan Foil M.D. On: 06/12/2017 20:33   Dg Ercp With Sphincterotomy  Result Date: 06/15/2017 CLINICAL DATA:  84 year old female with choledocholithiasis EXAM: ERCP TECHNIQUE: Multiple spot images obtained with the fluoroscopic device and submitted for interpretation post-procedure. FLUOROSCOPY TIME:  Please see operative note for further detail. COMPARISON:  CT abdomen/pelvis 06/12/2017 FINDINGS: A total of 21 intraoperative saved images are submitted for review. The images depict a flexible endoscope in the descending duodenum with deep wire cannulation of the hepatic  ducts. Initial cholangiogram demonstrates a dilated common bile duct with faceted filling defects consistent with choledocholithiasis. The patient is status post cholecystectomy. Subsequent images document sphincterotomy and balloon sweeping of the common duct. IMPRESSION: 1. Choledocholithiasis. 2. ERCP with sphincterotomy and balloon sweep of the common duct. These images were submitted for radiologic interpretation only. Please see the procedural report for the amount of contrast and the fluoroscopy time utilized. Electronically Signed   By: Jacqulynn Cadet M.D.   On: 06/15/2017 09:15    Micro Results     Recent Results (from the past 240 hour(s))  Culture, blood (routine x 2)     Status: Abnormal   Collection Time: 06/12/17  5:23 PM  Result Value Ref Range Status   Specimen Description   Final    BLOOD BLOOD LEFT ARM Performed at [facility] [address], [place], [place] [address]    Special Requests   Final    BOTTLES DRAWN AEROBIC AND ANAEROBIC Blood Culture adequate volume Performed at [facility] [address] [place], [place] [address]    Culture  Setup Time   Final    GRAM NEGATIVE RODS IN BOTH AEROBIC AND ANAEROBIC BOTTLES CRITICAL RESULT CALLED TO, READ BACK BY AND VERIFIED WITH: Melodye Ped PHARMD, AT 8502 06/13/17 BY Rush Landmark Performed at [facility], [place] [address] [place], [place] [address]    Culture ESCHERICHIA COLI (A)  Final   Report Status 06/16/2017 FINAL  Final   Organism ID, Bacteria ESCHERICHIA COLI  Final      Susceptibility   Escherichia coli - MIC*    AMPICILLIN <=2 SENSITIVE Sensitive     CEFAZOLIN <=4 SENSITIVE Sensitive     CEFEPIME <=1 SENSITIVE Sensitive     CEFTAZIDIME <=1 SENSITIVE Sensitive     CEFTRIAXONE <=1 SENSITIVE Sensitive     CIPROFLOXACIN <=0.25 SENSITIVE Sensitive     GENTAMICIN <=1 SENSITIVE Sensitive     IMIPENEM <=0.25 SENSITIVE Sensitive     TRIMETH/SULFA <=20 SENSITIVE  Sensitive     AMPICILLIN/SULBACTAM <=2 SENSITIVE Sensitive     PIP/TAZO <=4 SENSITIVE Sensitive     Extended ESBL NEGATIVE Sensitive     * ESCHERICHIA COLI  Blood Culture ID Panel (Reflexed)     Status: Abnormal   Collection Time: 06/12/17  5:23 PM  Result Value Ref Range Status  Enterococcus species NOT DETECTED NOT DETECTED Final   Vancomycin resistance NOT DETECTED NOT DETECTED Final   Listeria monocytogenes NOT DETECTED NOT DETECTED Final   Staphylococcus species NOT DETECTED NOT DETECTED Final   Staphylococcus aureus NOT DETECTED NOT DETECTED Final   Methicillin resistance NOT DETECTED NOT DETECTED Final   Streptococcus species NOT DETECTED NOT DETECTED Final   Streptococcus agalactiae NOT DETECTED NOT DETECTED Final   Streptococcus pneumoniae NOT DETECTED NOT DETECTED Final   Streptococcus pyogenes NOT DETECTED NOT DETECTED Final   Acinetobacter baumannii NOT DETECTED NOT DETECTED Final   Enterobacteriaceae species DETECTED (A) NOT DETECTED Final    Comment: CRITICAL RESULT CALLED TO, READ BACK BY AND VERIFIED WITH: J. GADHIA PHARMD, AT 0801 06/13/17 BY D. VANHOOK Enterobacteriaceae represent a large family of gram-negative bacteria, not a single organism.    Enterobacter cloacae complex NOT DETECTED NOT DETECTED Final   Escherichia coli DETECTED (A) NOT DETECTED Final    Comment: CRITICAL RESULT CALLED TO, READ BACK BY AND VERIFIED WITH: J. GADHIA PHARMD, AT 0801 06/13/17 BY D. VANHOOK    Klebsiella oxytoca NOT DETECTED NOT DETECTED Final   Klebsiella pneumoniae NOT DETECTED NOT DETECTED Final   Proteus species NOT DETECTED NOT DETECTED Final   Serratia marcescens NOT DETECTED NOT DETECTED Final   Carbapenem resistance NOT DETECTED NOT DETECTED Final   Haemophilus influenzae NOT DETECTED NOT DETECTED Final   Neisseria meningitidis NOT DETECTED NOT DETECTED Final   Pseudomonas aeruginosa NOT DETECTED NOT DETECTED Final   Candida albicans NOT DETECTED NOT DETECTED Final    Candida glabrata NOT DETECTED NOT DETECTED Final   Candida krusei NOT DETECTED NOT DETECTED Final   Candida parapsilosis NOT DETECTED NOT DETECTED Final   Candida tropicalis NOT DETECTED NOT DETECTED Final    Comment: Performed at [facility], [place] [address], [place], [place] [address]  Culture, blood (routine x 2)     Status: Abnormal   Collection Time: 06/12/17  5:32 PM  Result Value Ref Range Status   Specimen Description   Final    BLOOD BLOOD RIGHT HAND Performed at [facility] [address], [place], [place] [address]    Special Requests   Final    BOTTLES DRAWN AEROBIC AND ANAEROBIC Blood Culture adequate volume Performed at [facility] [address] [place], [place] [address]    Culture  Setup Time   Final    GRAM NEGATIVE RODS IN BOTH AEROBIC AND ANAEROBIC BOTTLES CRITICAL VALUE NOTED.  VALUE IS CONSISTENT WITH PREVIOUSLY REPORTED AND CALLED VALUE.    Culture (A)  Final    ESCHERICHIA COLI SUSCEPTIBILITIES PERFORMED ON PREVIOUS CULTURE WITHIN THE LAST 5 DAYS. Performed at [facility], [place] [address] [place], [place] [address]    Report Status 06/16/2017 FINAL  Final  Urine culture     Status: Abnormal   Collection Time: 06/12/17  7:40 PM  Result Value Ref Range Status   Specimen Description   Final    URINE, CLEAN CATCH Performed at [facility], [place] [address] [place], [place] [address]    Special Requests   Final    NONE Performed at [facility], [place] [address] [place], [place] [address]    Culture MULTIPLE SPECIES PRESENT, SUGGEST RECOLLECTION (A)  Final   Report Status 06/14/2017 FINAL  Final  MRSA PCR Screening     Status: None   Collection Time: 06/14/17  5:42 AM  Result Value  Ref Range Status   MRSA by PCR NEGATIVE NEGATIVE Final    Comment:        The GeneXpert MRSA Assay (FDA approved for NASAL specimens only), is one component of a comprehensive MRSA  colonization surveillance program. It is not intended to diagnose MRSA infection nor to guide or monitor treatment for MRSA infections. Performed at [facility], [place] [address] [place], [place] [address]        Today   Subjective    [name] today has no headache,no chest abdominal pain,no new weakness tingling or numbness, feels much better wants to go SNF  today.  Objective   Blood pressure (!) 147/62, pulse 73, temperature 98.8 F (37.1 C), temperature source Oral, resp. rate 16, height 5\' 7"  (1.702 m), weight 56.1 kg (123 lb 10.9 oz), SpO2 97 %.   Intake/Output Summary (Last 24 hours) at 06/21/2017 0739 Last data filed at 06/21/2017 0351 Gross per 24 hour  Intake -  Output 1000 ml  Net -1000 ml    Exam Awake Alert, Oriented x 3, No new F.N deficits, Normal affect [place].AT,PERRAL Supple Neck,No JVD, No cervical lymphadenopathy appriciated.  Symmetrical Chest wall movement, Good air movement bilaterally, CTAB RRR,No Gallops,Rubs or new Murmurs, No Parasternal Heave +ve B.Sounds, Abd Soft, Non tender, No organomegaly appriciated, No rebound -guarding or rigidity. No Cyanosis, Clubbing or edema, No new Rash or bruise   Data Review   CBC w Diff:  Lab Results  Component Value Date   WBC 10.0 06/20/2017   HGB 13.2 06/20/2017   HGB 14.3 10/07/2011   HCT 41.0 06/20/2017   HCT 42.1 10/07/2011   PLT 323 06/20/2017   PLT 188 10/07/2011   LYMPHOPCT 6 (L) 01/23/2009   MONOPCT 3 01/23/2009   EOSPCT 0 01/23/2009   BASOPCT 0 01/23/2009    CMP:  Lab Results  Component Value Date   NA 139 06/20/2017   NA 143 10/07/2011   K 4.0 06/20/2017   K 4.0 10/07/2011   CL 103 06/20/2017   CL 103 10/07/2011   CO2 24 06/20/2017   CO2 30 10/07/2011   BUN 20 06/20/2017   BUN 21 (H) 10/07/2011   CREATININE 0.82 06/20/2017   CREATININE 0.76 10/07/2011   PROT 6.3 (L) 06/20/2017   PROT 7.2 10/07/2011   ALBUMIN 2.9 (L) 06/20/2017   ALBUMIN 3.8 10/07/2011     BILITOT 0.3 06/20/2017   BILITOT 0.3 10/07/2011   ALKPHOS 249 (H) 06/20/2017   ALKPHOS 66 10/07/2011   AST 31 06/20/2017   AST 22 10/07/2011   ALT 96 (H) 06/20/2017   ALT 17 10/07/2011  .   Total Time in preparing paper work, data evaluation and todays exam - 59 minutes  Jani Gravel M.D on 06/21/2017 at 7:39 AM  Office [phone]

## 2017-06-21 NOTE — Progress Notes (Addendum)
Patient ID: [name], female   DOB: 17-Nov-1918, 84 y.o.   MRN: [id number]                                                                PROGRESS NOTE                                                                                                                                                                                                             Patient Demographics:    [name], is a 84 y.o. female, DOB - [birth year], ZTI:[id number]  Admit date - 06/12/2017   Admitting Physician Jani Gravel, MD  Outpatient Primary MD for the patient is [name], MD  LOS - 9  Outpatient Specialists:   No chief complaint on file.  fever, weakness    Brief Narrative     84 y.o.female,w dementia, hypertension, hyperlipidemia, depression, ? Circulation issues that apparently presents with c/o generalized weakness for the past 1 week. Subjective fever. Pt denies cough, cp, palp, sob, n/v, abd pain, diarrhea, brbpr, black stool. + weight loss.   Pt has trop 0.08, and markedly abnormal liver function. Pt denies new medication. Pt denies any risk for viral hepatitis.       Subjective:    84 Deter today is able to sit up. bp improving with increase in amlodipine.   Appears to be in nsr.  Pt is without complaints.  Pt awaiting placement to SNF today.   Liver function improving. S/p ERCP    No headache, No chest pain, No abdominal pain - No Nausea, No new weakness tingling or numbness, No Cough - SOB.    Assessment  & Plan :    Active Problems:   Fever   Cholangitis   Choledocholithiasis      Abnormal liver functionstable pt has cholelithiasis, ERCP2/8 with sphincterotomy Appreciate GI input Will follow LFT, improving cmpin am  ? Fever prior to admission Blood culture + E. Coli.  Aztreonam=> Zosynpharmacy to dose(2/7=> 2/10) DC Zosyn2/10 ContAugmentin 875mg  po bid (2/10=>) ContFlorastor  Troponin elevation(EF 60-65%) Chronic,  stable Cardiac echo 06/13/2017=> EF 60-65%, moderate AR, mod pulm hypertension  Afib with RVR (EF 60-65%) New overnite (Chads2vasc=4) IncreaseMetoprolol37.5mg  po bid Metoprolol 2.5mg  iv q6h prn HR >100 persistent STOP Amiodarone 2/11 Contaspirin 81mg  po qday Too  much fall risk for conventional anticoagulation due to dementia and hx of falls  Hypertension Contamlodipine 5mg  po qday ContHydralazine 5mg  iv q6h prn sbp >160  Compression fracture T10, T12 MRI T spine(stable, old fracture) Cont norco  Severe protein calorie malnutrition prostat  Deconditioning Gait instability PT/OT to evaluate and tx, social work for SNF   Code Status:DNR  Family Communication :spoke with daughter yesterdayand updated her  Disposition Plan:SNF  Barriers For Discharge:  Consults :GI, cardiology  Procedures :   DVT Prophylaxis: Lovenox SCDs    Antibiotics :aztreonam2/7, zosyn 2/7=>2/10 Augmentin 2/10=>      Anti-infectives (From admission, onward)   Start     Dose/Rate Route Frequency Ordered Stop   06/18/17 0000  amoxicillin-clavulanate (AUGMENTIN) 875-125 MG tablet     1 tablet Oral Every 12 hours 06/18/17 0742 06/23/17 2359   06/16/17 1000  amoxicillin-clavulanate (AUGMENTIN) 875-125 MG per tablet 1 tablet     1 tablet Oral Every 12 hours 06/16/17 0621     06/13/17 1400  piperacillin-tazobactam (ZOSYN) IVPB 3.375 g  Status:  Discontinued     3.375 g 12.5 mL/hr over 240 Minutes Intravenous Every 8 hours 06/13/17 1251 06/16/17 0646   06/13/17 1000  aztreonam (AZACTAM) 1 GM IVPB  Status:  Discontinued     1 g 100 mL/hr over 30 Minutes Intravenous Every 8 hours 06/13/17 0900 06/13/17 1223   06/13/17 0830  aztreonam (AZACTAM) 1 g in dextrose 5 % 50 mL IVPB  Status:  Discontinued     1 g 100 mL/hr over 30 Minutes Intravenous Every 8 hours 06/13/17 0823 06/13/17 0900        Objective:   Vitals:   06/20/17 1458 06/20/17  2240 06/21/17 0451 06/21/17 0546  BP: (!) 145/50 140/64 (!) 147/62   Pulse: 85 68 73   Resp: 20 18 16    Temp: 98.3 F (36.8 C) 98.1 F (36.7 C) 98.8 F (37.1 C)   TempSrc: Oral Oral Oral   SpO2: 96% 98% 97%   Weight:    56.1 kg (123 lb 10.9 oz)  Height:        Wt Readings from Last 3 Encounters:  06/21/17 56.1 kg (123 lb 10.9 oz)     Intake/Output Summary (Last 24 hours) at 06/21/2017 0732 Last data filed at 06/21/2017 0351 Gross per 24 hour  Intake -  Output 1000 ml  Net -1000 ml     Physical Exam  Awake Alert, Oriented X 2(person, place), No new F.N deficits, Normal affect [place].AT,PERRAL Supple Neck,No JVD, No cervical lymphadenopathy appriciated.  Symmetrical Chest wall movement, Good air movement bilaterally, CTAB RRR,No Gallops,Rubs or new Murmurs, No Parasternal Heave +ve B.Sounds, Abd Soft, No tenderness, No organomegaly appriciated, No rebound - guarding or rigidity. No Cyanosis, Clubbing or edema, No new Rash or bruise      Data Review:    CBC Recent Labs  Lab 06/15/17 0433 06/16/17 0451 06/17/17 0458 06/20/17 0458  WBC 5.7 9.8 11.8* 10.0  HGB 11.2* 12.6 13.7 13.2  HCT 34.0* 38.7 41.3 41.0  PLT 254 303 343 323  MCV 89.5 89.6 89.4 91.3  MCH 29.5 29.2 29.7 29.4  MCHC 32.9 32.6 33.2 32.2  RDW 14.8 14.8 14.8 14.7    Chemistries  Recent Labs  Lab 06/15/17 0433 06/16/17 0451 06/17/17 0458 06/18/17 0410 06/20/17 0458 06/21/17 0624  NA 140 140 134* 138 139  --   K 3.8 3.5 3.2* 3.7 4.0  --   CL 107 108 101 103  103  --   CO2 26 24 22 24 24   --   GLUCOSE 94 109* 114* 105* 106*  --   BUN 14 15 9 20 20   --   CREATININE 0.77 0.82 0.63 0.79 0.82  --   CALCIUM 7.8* 8.2* 8.3* 8.5* 8.7*  --   MG  --   --  1.8  --   --  2.2  AST 243* 108* 66* 52* 31  --   ALT 455* 324* 230* 166* 96*  --   ALKPHOS 454* 430* 370* 302* 249*  --   BILITOT 0.7 0.5 0.4 0.7 0.3  --     ------------------------------------------------------------------------------------------------------------------ No results for input(s): CHOL, HDL, LDLCALC, TRIG, CHOLHDL, LDLDIRECT in the last 72 hours.  Lab Results  Component Value Date   HGBA1C 6.0 (H) 06/12/2017   ------------------------------------------------------------------------------------------------------------------ No results for input(s): TSH, T4TOTAL, T3FREE, THYROIDAB in the last 72 hours.  Invalid input(s): FREET3 ------------------------------------------------------------------------------------------------------------------ No results for input(s): VITAMINB12, FOLATE, FERRITIN, TIBC, IRON, RETICCTPCT in the last 72 hours.  Coagulation profile No results for input(s): INR, PROTIME in the last 168 hours.  No results for input(s): DDIMER in the last 72 hours.  Cardiac Enzymes Recent Labs  Lab 06/14/17 1014 06/14/17 1601  TROPONINI 0.08* 0.06*   ------------------------------------------------------------------------------------------------------------------ No results found for: BNP  Inpatient Medications  Scheduled Meds: . amLODipine  5 mg Oral Daily  . amoxicillin-clavulanate  1 tablet Oral Q12H  . aspirin EC  81 mg Oral Daily  . docusate sodium  100 mg Oral Daily  . feeding supplement (PRO-STAT SUGAR FREE 64)  30 mL Oral BID  . indomethacin  100 mg Rectal Once  . lactose free nutrition  237 mL Oral BID BM  . metoprolol tartrate  25 mg Oral BID  . saccharomyces boulardii  250 mg Oral BID   Continuous Infusions: PRN Meds:.acetaminophen **OR** acetaminophen, hydrALAZINE, iopamidol, metoprolol tartrate  Micro Results Recent Results (from the past 240 hour(s))  Culture, blood (routine x 2)     Status: Abnormal   Collection Time: 06/12/17  5:23 PM  Result Value Ref Range Status   Specimen Description   Final    BLOOD BLOOD LEFT ARM Performed at [facility]  [address], [place], [place] [address]    Special Requests   Final    BOTTLES DRAWN AEROBIC AND ANAEROBIC Blood Culture adequate volume Performed at [facility] [address], [place], [place] [address]    Culture  Setup Time   Final    GRAM NEGATIVE RODS IN BOTH AEROBIC AND ANAEROBIC BOTTLES CRITICAL RESULT CALLED TO, READ BACK BY AND VERIFIED WITH: Melodye Ped PHARMD, AT 0626 06/13/17 BY Rush Landmark Performed at [facility], [place] [address] [place], [place] [address]    Culture ESCHERICHIA COLI (A)  Final   Report Status 06/16/2017 FINAL  Final   Organism ID, Bacteria ESCHERICHIA COLI  Final      Susceptibility   Escherichia coli - MIC*    AMPICILLIN <=2 SENSITIVE Sensitive     CEFAZOLIN <=4 SENSITIVE Sensitive     CEFEPIME <=1 SENSITIVE Sensitive     CEFTAZIDIME <=1 SENSITIVE Sensitive     CEFTRIAXONE <=1 SENSITIVE Sensitive     CIPROFLOXACIN <=0.25 SENSITIVE Sensitive     GENTAMICIN <=1 SENSITIVE Sensitive     IMIPENEM <=0.25 SENSITIVE Sensitive     TRIMETH/SULFA <=20 SENSITIVE Sensitive     AMPICILLIN/SULBACTAM <=2 SENSITIVE Sensitive     PIP/TAZO <=  4 SENSITIVE Sensitive     Extended ESBL NEGATIVE Sensitive     * ESCHERICHIA COLI  Blood Culture ID Panel (Reflexed)     Status: Abnormal   Collection Time: 06/12/17  5:23 PM  Result Value Ref Range Status   Enterococcus species NOT DETECTED NOT DETECTED Final   Vancomycin resistance NOT DETECTED NOT DETECTED Final   Listeria monocytogenes NOT DETECTED NOT DETECTED Final   Staphylococcus species NOT DETECTED NOT DETECTED Final   Staphylococcus aureus NOT DETECTED NOT DETECTED Final   Methicillin resistance NOT DETECTED NOT DETECTED Final   Streptococcus species NOT DETECTED NOT DETECTED Final   Streptococcus agalactiae NOT DETECTED NOT DETECTED Final   Streptococcus pneumoniae NOT DETECTED NOT DETECTED Final   Streptococcus pyogenes NOT DETECTED NOT DETECTED Final   Acinetobacter baumannii NOT DETECTED  NOT DETECTED Final   Enterobacteriaceae species DETECTED (A) NOT DETECTED Final    Comment: CRITICAL RESULT CALLED TO, READ BACK BY AND VERIFIED WITH: J. GADHIA PHARMD, AT 0801 06/13/17 BY D. VANHOOK Enterobacteriaceae represent a large family of gram-negative bacteria, not a single organism.    Enterobacter cloacae complex NOT DETECTED NOT DETECTED Final   Escherichia coli DETECTED (A) NOT DETECTED Final    Comment: CRITICAL RESULT CALLED TO, READ BACK BY AND VERIFIED WITH: J. GADHIA PHARMD, AT 0801 06/13/17 BY D. VANHOOK    Klebsiella oxytoca NOT DETECTED NOT DETECTED Final   Klebsiella pneumoniae NOT DETECTED NOT DETECTED Final   Proteus species NOT DETECTED NOT DETECTED Final   Serratia marcescens NOT DETECTED NOT DETECTED Final   Carbapenem resistance NOT DETECTED NOT DETECTED Final   Haemophilus influenzae NOT DETECTED NOT DETECTED Final   Neisseria meningitidis NOT DETECTED NOT DETECTED Final   Pseudomonas aeruginosa NOT DETECTED NOT DETECTED Final   Candida albicans NOT DETECTED NOT DETECTED Final   Candida glabrata NOT DETECTED NOT DETECTED Final   Candida krusei NOT DETECTED NOT DETECTED Final   Candida parapsilosis NOT DETECTED NOT DETECTED Final   Candida tropicalis NOT DETECTED NOT DETECTED Final    Comment: Performed at [facility], [place] [address] [place], [place] [address]  Culture, blood (routine x 2)     Status: Abnormal   Collection Time: 06/12/17  5:32 PM  Result Value Ref Range Status   Specimen Description   Final    BLOOD BLOOD RIGHT HAND Performed at [facility] [address] [place], [place] [address]    Special Requests   Final    BOTTLES DRAWN AEROBIC AND ANAEROBIC Blood Culture adequate volume Performed at [facility] [address], [place], [place] [address]    Culture  Setup Time   Final    GRAM NEGATIVE RODS IN BOTH AEROBIC AND ANAEROBIC BOTTLES CRITICAL VALUE NOTED.  VALUE IS CONSISTENT WITH  PREVIOUSLY REPORTED AND CALLED VALUE.    Culture (A)  Final    ESCHERICHIA COLI SUSCEPTIBILITIES PERFORMED ON PREVIOUS CULTURE WITHIN THE LAST 5 DAYS. Performed at [facility], [place] [address] [place],  [address]    Report Status 06/16/2017 FINAL  Final  Urine culture     Status: Abnormal   Collection Time: 06/12/17  7:40 PM  Result Value Ref Range Status   Specimen Description   Final    URINE, CLEAN CATCH Performed at [facility], [place] [address], [place],  [address]    Special Requests   Final    NONE Performed at [facility], [address] [place], [place] [address]    Culture MULTIPLE SPECIES PRESENT, SUGGEST RECOLLECTION (A)  Final   Report Status 06/14/2017 FINAL  Final  MRSA PCR Screening     Status: None   Collection Time: 06/14/17  5:42 AM  Result Value Ref Range Status   MRSA by PCR NEGATIVE NEGATIVE Final    Comment:        The GeneXpert MRSA Assay (FDA approved for NASAL specimens only), is one component of a comprehensive MRSA colonization surveillance program. It is not intended to diagnose MRSA infection nor to guide or monitor treatment for MRSA infections. Performed at [facility], [place] [address], [place], [place] [address]     Radiology Reports Dg Chest 2 View  Result Date: 06/12/2017 CLINICAL DATA:  Fever EXAM: CHEST  2 VIEW COMPARISON:  06/12/2017, 09/10/2016 FINDINGS: Negative for heart failure. Atherosclerotic calcification aortic arch. Mild right pleural effusion with mild right lower lobe atelectasis. No definite pneumonia. Multiple mild chronic compression fractures in the thoracic spine with kyphosis. Advanced degenerative change left shoulder joint IMPRESSION: Mild right lower lobe atelectasis and small right effusion. No definite pneumonia. Electronically Signed   By: Franchot Gallo M.D.   On: 06/12/2017 20:35   Ct Head Wo Contrast  Result Date:  06/12/2017 CLINICAL DATA:  Dementia EXAM: CT HEAD WITHOUT CONTRAST TECHNIQUE: Contiguous axial images were obtained from the base of the skull through the vertex without intravenous contrast. COMPARISON:  10/07/2011 FINDINGS: Brain: Moderate atrophy, with progression since 2013. Moderate chronic microvascular ischemic change in the white matter, with progression. Hypodensity left thalamus consistent with chronic infarct. Negative for acute hemorrhage or mass.  No midline shift Vascular: Negative for vascular thrombosis Skull: Negative Sinuses/Orbits: Negative Other: None IMPRESSION: Progression of atrophy and chronic microvascular ischemia since 2013. No acute abnormality. Electronically Signed   By: Franchot Gallo M.D.   On: 06/12/2017 20:40   Mr Thoracic Spine Wo Contrast  Result Date: 06/13/2017 CLINICAL DATA:  Thoracic spine fracture EXAM: MRI THORACIC SPINE WITHOUT CONTRAST TECHNIQUE: Multiplanar, multisequence MR imaging of the thoracic spine was performed. No intravenous contrast was administered. COMPARISON:  Chest radiograph 06/12/2017 FINDINGS: Alignment:  Increased thoracic kyphosis.  No static subluxation. Vertebrae: There is moderate height loss T9, T10 and T12 with depression of the superior endplates but no marrow edema. These findings were present on prior studies. Cord:  Normal signal and morphology. Paraspinal and other soft tissues: Large right and trace left pleural effusions. Disc levels: No large disc herniation.  No spinal canal stenosis. IMPRESSION: 1. Multilevel chronic vertebral body height loss, particularly at the T9, T10 and T12 levels. This is unchanged compared to multiple old studies. No acute compression fracture. 2. No thoracic spinal canal stenosis or cord abnormality. 3. Large right and small left pleural effusions Electronically Signed   By: Ulyses Jarred M.D.   On: 06/13/2017 20:10   Ct Abdomen Pelvis W Contrast  Addendum Date: 06/13/2017   ADDENDUM REPORT: 06/13/2017 09:21  ADDENDUM: At least 2 stones in the distal common bile duct with prominent common bile duct wall enhancement. Consider cholangitis as cause of symptoms. These findings were discussed with Dr. Maudie Mercury. The 2 small low densities in the right liver could reflect developing biloma/abscess. No drainable collection. Electronically Signed   By: Monte Fantasia M.D.   On: 06/13/2017 09:21   Result Date: 06/13/2017 CLINICAL DATA:  Dementia generalize weakness fever positive weight loss abnormal liver function EXAM: CT ABDOMEN AND PELVIS WITH CONTRAST  TECHNIQUE: Multidetector CT imaging of the abdomen and pelvis was performed using the standard protocol following bolus administration of intravenous contrast. CONTRAST:  142mL ISOVUE-300 IOPAMIDOL (ISOVUE-300) INJECTION 61% COMPARISON:  Radiograph 09/04/2016, CT chest 07/15/2008 FINDINGS: Lower chest: Small right-sided pleural effusion with partial atelectasis at the right base. The left lung base is clear. Borderline heart size. Coronary vascular calcification. Hepatobiliary: Heterogeneous liver parenchyma with vague hypoenhancing foci in the right lobe, for example series 2, image number 18 measuring 3 mm, and within the dome of the liver measuring approximately 1.9 cm. Surgical clips in the gallbladder fossa. No biliary dilatation. Pancreas: Unremarkable. No pancreatic ductal dilatation or surrounding inflammatory changes. Spleen: Normal in size without focal abnormality. Adrenals/Urinary Tract: Adrenal glands are within normal limits. Diffuse cortical thinning of the kidneys. No hydronephrosis. The bladder is unremarkable Stomach/Bowel: Stomach is nonenlarged. No dilated small bowel. No colon wall thickening. Sigmoid colon diverticular disease. Vascular/Lymphatic: Extensive aortic atherosclerosis without aneurysmal dilatation. No significantly enlarged lymph nodes. Reproductive: Status post hysterectomy. No adnexal masses. Other: Negative for free air or free fluid.  Musculoskeletal: Degenerative changes of the spine. Trace retrolisthesis of L1 on L2. Mild to moderate compression deformities at T12 and T10. IMPRESSION: 1. Coarse heterogeneous appearance of the liver with diffuse decreased density suggesting steatosis or hepatocellular disease. There are vague indeterminate hypoenhancing lesions within the dome of the liver and right lobe, for which nonemergent liver MRI could be helpful to further evaluate. There is no biliary dilatation in this post cholecystectomy patient. 2. Sigmoid colon diverticular disease without acute inflammation 3. Small right pleural effusion with partial atelectasis at the right base 4. Mild to moderate compression deformities at T10 and T12. Electronically Signed: By: Donavan Foil M.D. On: 06/12/2017 20:33   Dg Ercp With Sphincterotomy  Result Date: 06/15/2017 CLINICAL DATA:  84 year old female with choledocholithiasis EXAM: ERCP TECHNIQUE: Multiple spot images obtained with the fluoroscopic device and submitted for interpretation post-procedure. FLUOROSCOPY TIME:  Please see operative note for further detail. COMPARISON:  CT abdomen/pelvis 06/12/2017 FINDINGS: A total of 21 intraoperative saved images are submitted for review. The images depict a flexible endoscope in the descending duodenum with deep wire cannulation of the hepatic ducts. Initial cholangiogram demonstrates a dilated common bile duct with faceted filling defects consistent with choledocholithiasis. The patient is status post cholecystectomy. Subsequent images document sphincterotomy and balloon sweeping of the common duct. IMPRESSION: 1. Choledocholithiasis. 2. ERCP with sphincterotomy and balloon sweep of the common duct. These images were submitted for radiologic interpretation only. Please see the procedural report for the amount of contrast and the fluoroscopy time utilized. Electronically Signed   By: Jacqulynn Cadet M.D.   On: 06/15/2017 09:15    Time Spent in  minutes  30   Jani Gravel M.D on 06/21/2017 at 7:32 AM  Between 7am to 7am - Pager - [phone]

## 2017-06-21 NOTE — Clinical Social Work Placement (Signed)
Pt discharged with plan to admit to [facility] for [place] rehab- Report [phone]. Plan to return to [facility] after completing rehab. [facility] transportation arranged. DC information provided to facility via the [place]. CSW AD approved 5 day LOG which SNF accepted as [facility] authorization is pending.  Pt's daughter Manuela Schwartz updated of plan via phone and is agreeable.  CLINICAL SOCIAL WORK PLACEMENT  NOTE  Date:  06/21/2017  Patient Details  Name: [name] MRN: [id number] Date of Birth: 02-Jan-1919  Clinical Social Work is seeking post-discharge placement for this patient at the [place] level of care (*CSW will initial, date and re-position this form in  chart as items are completed):  Yes   Patient/family provided with [place] Work Department's list of facilities offering this level of care within the geographic area requested by the patient (or if unable, by the patient's family).  Yes   Patient/family informed of their freedom to choose among providers that offer the needed level of care, that participate in Medicare, Medicaid or managed care program needed by the patient, have an available bed and are willing to accept the patient.  Yes   Patient/family informed of [facility]'s ownership interest in [facility] and [facility], as well as of the fact that they are under no obligation to receive care at these facilities.  PASRR submitted to EDS on       PASRR number received on       Existing PASRR number confirmed on 06/18/17     FL2 transmitted to all facilities in geographic area requested by pt/family on 06/18/17     FL2 transmitted to all facilities within larger geographic area on 06/21/17     Patient informed that his/her managed care company has contracts with or will negotiate with certain facilities, including the following:  [employer] informed of bed  offers received.  Patient chooses bed at [facility]     Physician recommends and patient chooses bed at [facility]    Patient to be transferred to [facility] on 06/21/17.  Patient to be transferred to facility by     [facility]  Patient family notified on 06/21/17 of transfer.  Name of family member notified:  daughter Manuela Schwartz      PHYSICIAN       Additional Comment:    _______________________________________________ Nila Nephew, LCSW 06/21/2017, 3:31 PM

## 2017-06-24 DIAGNOSIS — K8309 Other cholangitis: Secondary | ICD-10-CM | POA: Diagnosis not present

## 2017-06-24 DIAGNOSIS — E440 Moderate protein-calorie malnutrition: Secondary | ICD-10-CM | POA: Insufficient documentation

## 2017-06-24 DIAGNOSIS — K8032 Calculus of bile duct with acute cholangitis without obstruction: Secondary | ICD-10-CM | POA: Diagnosis not present

## 2017-06-24 DIAGNOSIS — F0390 Unspecified dementia without behavioral disturbance: Secondary | ICD-10-CM | POA: Insufficient documentation

## 2017-06-24 DIAGNOSIS — I4891 Unspecified atrial fibrillation: Secondary | ICD-10-CM | POA: Diagnosis not present

## 2017-06-25 DIAGNOSIS — Z7189 Other specified counseling: Secondary | ICD-10-CM | POA: Diagnosis not present

## 2017-06-25 DIAGNOSIS — K8051 Calculus of bile duct without cholangitis or cholecystitis with obstruction: Secondary | ICD-10-CM | POA: Diagnosis not present

## 2017-06-25 DIAGNOSIS — A419 Sepsis, unspecified organism: Secondary | ICD-10-CM | POA: Diagnosis not present

## 2017-07-05 DIAGNOSIS — R531 Weakness: Secondary | ICD-10-CM | POA: Diagnosis not present

## 2017-07-05 DIAGNOSIS — I10 Essential (primary) hypertension: Secondary | ICD-10-CM | POA: Diagnosis not present

## 2017-07-05 DIAGNOSIS — F0390 Unspecified dementia without behavioral disturbance: Secondary | ICD-10-CM | POA: Diagnosis not present

## 2017-07-05 DIAGNOSIS — A498 Other bacterial infections of unspecified site: Secondary | ICD-10-CM | POA: Diagnosis not present

## 2017-07-09 ENCOUNTER — Other Ambulatory Visit: Payer: Self-pay

## 2017-07-09 DIAGNOSIS — I739 Peripheral vascular disease, unspecified: Secondary | ICD-10-CM | POA: Diagnosis not present

## 2017-07-09 DIAGNOSIS — M810 Age-related osteoporosis without current pathological fracture: Secondary | ICD-10-CM | POA: Diagnosis not present

## 2017-07-09 DIAGNOSIS — E559 Vitamin D deficiency, unspecified: Secondary | ICD-10-CM | POA: Diagnosis not present

## 2017-07-09 DIAGNOSIS — F0390 Unspecified dementia without behavioral disturbance: Secondary | ICD-10-CM | POA: Diagnosis not present

## 2017-07-09 DIAGNOSIS — Z8543 Personal history of malignant neoplasm of ovary: Secondary | ICD-10-CM | POA: Diagnosis not present

## 2017-07-09 DIAGNOSIS — I4891 Unspecified atrial fibrillation: Secondary | ICD-10-CM | POA: Diagnosis not present

## 2017-07-09 DIAGNOSIS — Z7982 Long term (current) use of aspirin: Secondary | ICD-10-CM | POA: Diagnosis not present

## 2017-07-09 DIAGNOSIS — I10 Essential (primary) hypertension: Secondary | ICD-10-CM | POA: Diagnosis not present

## 2017-07-09 DIAGNOSIS — E43 Unspecified severe protein-calorie malnutrition: Secondary | ICD-10-CM | POA: Diagnosis not present

## 2017-07-09 NOTE — Patient Outreach (Signed)
[facility]) Care Management  07/09/2017  [name] 01-14-1919 [id number]   Referral Date: 07/08/17 Referral Source: [employer] Report Date of Admission:  06/21/17 Diagnosis: ? Date of Discharge: 07/05/17 Facility: [place]: [facility] attempt # 1 Telephone call to patient for transition of care follow up.  No answer.  HIPAA compliant voice message left.    Plan: RN CM will send letter and attempt again within 4 business days.   Jone Baseman, RN, MSN [facility] Care Management Care Management Coordinator Direct Line [phone] Toll Free: [phone]  Fax: [phone]

## 2017-07-10 DIAGNOSIS — F0390 Unspecified dementia without behavioral disturbance: Secondary | ICD-10-CM | POA: Diagnosis not present

## 2017-07-10 DIAGNOSIS — E559 Vitamin D deficiency, unspecified: Secondary | ICD-10-CM | POA: Diagnosis not present

## 2017-07-10 DIAGNOSIS — I10 Essential (primary) hypertension: Secondary | ICD-10-CM | POA: Diagnosis not present

## 2017-07-10 DIAGNOSIS — Z8543 Personal history of malignant neoplasm of ovary: Secondary | ICD-10-CM | POA: Diagnosis not present

## 2017-07-10 DIAGNOSIS — I739 Peripheral vascular disease, unspecified: Secondary | ICD-10-CM | POA: Diagnosis not present

## 2017-07-10 DIAGNOSIS — I4891 Unspecified atrial fibrillation: Secondary | ICD-10-CM | POA: Diagnosis not present

## 2017-07-10 DIAGNOSIS — E43 Unspecified severe protein-calorie malnutrition: Secondary | ICD-10-CM | POA: Diagnosis not present

## 2017-07-10 DIAGNOSIS — Z7982 Long term (current) use of aspirin: Secondary | ICD-10-CM | POA: Diagnosis not present

## 2017-07-10 DIAGNOSIS — M810 Age-related osteoporosis without current pathological fracture: Secondary | ICD-10-CM | POA: Diagnosis not present

## 2017-07-11 ENCOUNTER — Emergency Department (HOSPITAL_COMMUNITY): Payer: Medicare HMO

## 2017-07-11 ENCOUNTER — Other Ambulatory Visit: Payer: Self-pay

## 2017-07-11 ENCOUNTER — Encounter (HOSPITAL_COMMUNITY): Payer: Self-pay

## 2017-07-11 ENCOUNTER — Observation Stay (HOSPITAL_COMMUNITY)
Admission: EM | Admit: 2017-07-11 | Discharge: 2017-07-15 | Disposition: A | Discharge status: Home / Self Care | Payer: Medicare HMO | Attending: Internal Medicine | Admitting: Internal Medicine

## 2017-07-11 DIAGNOSIS — J90 Pleural effusion, not elsewhere classified: Secondary | ICD-10-CM | POA: Diagnosis not present

## 2017-07-11 DIAGNOSIS — Z79899 Other long term (current) drug therapy: Secondary | ICD-10-CM | POA: Insufficient documentation

## 2017-07-11 DIAGNOSIS — R059 Cough, unspecified: Secondary | ICD-10-CM

## 2017-07-11 DIAGNOSIS — R630 Anorexia: Secondary | ICD-10-CM | POA: Diagnosis not present

## 2017-07-11 DIAGNOSIS — Z8543 Personal history of malignant neoplasm of ovary: Secondary | ICD-10-CM | POA: Diagnosis not present

## 2017-07-11 DIAGNOSIS — R404 Transient alteration of awareness: Secondary | ICD-10-CM | POA: Diagnosis not present

## 2017-07-11 DIAGNOSIS — I10 Essential (primary) hypertension: Secondary | ICD-10-CM | POA: Diagnosis present

## 2017-07-11 DIAGNOSIS — R509 Fever, unspecified: Secondary | ICD-10-CM | POA: Diagnosis not present

## 2017-07-11 DIAGNOSIS — R531 Weakness: Secondary | ICD-10-CM | POA: Diagnosis not present

## 2017-07-11 DIAGNOSIS — F0390 Unspecified dementia without behavioral disturbance: Secondary | ICD-10-CM | POA: Diagnosis not present

## 2017-07-11 DIAGNOSIS — R05 Cough: Secondary | ICD-10-CM | POA: Insufficient documentation

## 2017-07-11 DIAGNOSIS — E860 Dehydration: Secondary | ICD-10-CM | POA: Diagnosis not present

## 2017-07-11 DIAGNOSIS — Z7902 Long term (current) use of antithrombotics/antiplatelets: Secondary | ICD-10-CM | POA: Insufficient documentation

## 2017-07-11 DIAGNOSIS — Z7982 Long term (current) use of aspirin: Secondary | ICD-10-CM | POA: Insufficient documentation

## 2017-07-11 DIAGNOSIS — I739 Peripheral vascular disease, unspecified: Secondary | ICD-10-CM | POA: Diagnosis present

## 2017-07-11 DIAGNOSIS — R5383 Other fatigue: Secondary | ICD-10-CM | POA: Diagnosis present

## 2017-07-11 LAB — CBC WITH DIFFERENTIAL/PLATELET
Basophils Absolute: 0 10*3/uL (ref 0.0–0.1)
Basophils Relative: 0 %
EOS PCT: 0 %
Eosinophils Absolute: 0 10*3/uL (ref 0.0–0.7)
HEMATOCRIT: 46.6 % — AB (ref 36.0–46.0)
Hemoglobin: 15.3 g/dL — ABNORMAL HIGH (ref 12.0–15.0)
LYMPHS ABS: 1.1 10*3/uL (ref 0.7–4.0)
LYMPHS PCT: 24 %
MCH: 30.4 pg (ref 26.0–34.0)
MCHC: 32.8 g/dL (ref 30.0–36.0)
MCV: 92.6 fL (ref 78.0–100.0)
MONO ABS: 0.6 10*3/uL (ref 0.1–1.0)
Monocytes Relative: 14 %
NEUTROS ABS: 2.9 10*3/uL (ref 1.7–7.7)
Neutrophils Relative %: 62 %
PLATELETS: 232 10*3/uL (ref 150–400)
RBC: 5.03 MIL/uL (ref 3.87–5.11)
RDW: 14.5 % (ref 11.5–15.5)
WBC: 4.6 10*3/uL (ref 4.0–10.5)

## 2017-07-11 LAB — COMPREHENSIVE METABOLIC PANEL
ALT: 13 U/L — AB (ref 14–54)
AST: 30 U/L (ref 15–41)
Albumin: 3.6 g/dL (ref 3.5–5.0)
Alkaline Phosphatase: 109 U/L (ref 38–126)
Anion gap: 12 (ref 5–15)
BILIRUBIN TOTAL: 0.3 mg/dL (ref 0.3–1.2)
BUN: 28 mg/dL — AB (ref 6–20)
CALCIUM: 9.6 mg/dL (ref 8.9–10.3)
CHLORIDE: 98 mmol/L — AB (ref 101–111)
CO2: 29 mmol/L (ref 22–32)
CREATININE: 0.93 mg/dL (ref 0.44–1.00)
GFR, EST AFRICAN AMERICAN: 58 mL/min — AB (ref >60)
GFR, EST NON AFRICAN AMERICAN: 50 mL/min — AB (ref >60)
Glucose, Bld: 108 mg/dL — ABNORMAL HIGH (ref 65–99)
Potassium: 4.2 mmol/L (ref 3.5–5.1)
Sodium: 139 mmol/L (ref 135–145)
TOTAL PROTEIN: 7.6 g/dL (ref 6.5–8.1)

## 2017-07-11 LAB — URINALYSIS, ROUTINE W REFLEX MICROSCOPIC
Bacteria, UA: NONE SEEN
Bilirubin Urine: NEGATIVE
Glucose, UA: NEGATIVE mg/dL
Ketones, ur: 5 mg/dL — AB
Leukocytes, UA: NEGATIVE
Nitrite: NEGATIVE
PH: 5 (ref 5.0–8.0)
Protein, ur: 30 mg/dL — AB
Specific Gravity, Urine: 1.02 (ref 1.005–1.030)

## 2017-07-11 LAB — INFLUENZA PANEL BY PCR (TYPE A & B)
INFLAPCR: POSITIVE — AB
Influenza B By PCR: NEGATIVE

## 2017-07-11 LAB — LIPASE, BLOOD: LIPASE: 25 U/L (ref 11–51)

## 2017-07-11 MED ORDER — ASPIRIN EC 81 MG PO TBEC
81.0000 mg | DELAYED_RELEASE_TABLET | Freq: Every day | ORAL | Status: DC
  Administered 2017-07-12 – 2017-07-14 (×3): 81 mg via ORAL
  Filled 2017-07-11 (×4): qty 1

## 2017-07-11 MED ORDER — DONEPEZIL HCL 10 MG PO TABS
10.0000 mg | ORAL_TABLET | Freq: Every day | ORAL | Status: DC
  Administered 2017-07-11 – 2017-07-14 (×4): 10 mg via ORAL
  Filled 2017-07-11 (×3): qty 1

## 2017-07-11 MED ORDER — BOOST HIGH PROTEIN PO LIQD
1.0000 | ORAL | Status: DC
  Filled 2017-07-11: qty 237

## 2017-07-11 MED ORDER — SACCHAROMYCES BOULARDII 250 MG PO CAPS
250.0000 mg | ORAL_CAPSULE | Freq: Two times a day (BID) | ORAL | Status: DC
  Administered 2017-07-11 – 2017-07-14 (×7): 250 mg via ORAL
  Filled 2017-07-11 (×8): qty 1

## 2017-07-11 MED ORDER — AMLODIPINE BESYLATE 5 MG PO TABS
5.0000 mg | ORAL_TABLET | Freq: Every day | ORAL | Status: DC
  Administered 2017-07-12 – 2017-07-14 (×3): 5 mg via ORAL
  Filled 2017-07-11 (×4): qty 1

## 2017-07-11 MED ORDER — DOCUSATE SODIUM 100 MG PO CAPS
100.0000 mg | ORAL_CAPSULE | Freq: Every day | ORAL | Status: DC
  Administered 2017-07-12 – 2017-07-14 (×3): 100 mg via ORAL
  Filled 2017-07-11 (×4): qty 1

## 2017-07-11 MED ORDER — METOPROLOL TARTRATE 25 MG PO TABS
25.0000 mg | ORAL_TABLET | Freq: Two times a day (BID) | ORAL | Status: DC
  Administered 2017-07-11 – 2017-07-14 (×7): 25 mg via ORAL
  Filled 2017-07-11 (×8): qty 1

## 2017-07-11 MED ORDER — PRAVASTATIN SODIUM 20 MG PO TABS
80.0000 mg | ORAL_TABLET | Freq: Every day | ORAL | Status: DC
  Administered 2017-07-12 – 2017-07-14 (×3): 80 mg via ORAL
  Filled 2017-07-11 (×4): qty 4

## 2017-07-11 MED ORDER — SODIUM CHLORIDE 0.9 % IV BOLUS (SEPSIS)
1000.0000 mL | Freq: Once | INTRAVENOUS | Status: AC
  Administered 2017-07-11: 1000 mL via INTRAVENOUS

## 2017-07-11 MED ORDER — ONDANSETRON HCL 4 MG/2ML IJ SOLN: 4.0000 mg | Freq: Four times a day (QID) | INTRAMUSCULAR | Status: DC | PRN

## 2017-07-11 MED ORDER — VENLAFAXINE HCL ER 75 MG PO CP24
75.0000 mg | ORAL_CAPSULE | Freq: Every day | ORAL | Status: DC
  Administered 2017-07-12 – 2017-07-14 (×3): 75 mg via ORAL
  Filled 2017-07-11 (×4): qty 1

## 2017-07-11 MED ORDER — SODIUM CHLORIDE 0.9 % IV SOLN
INTRAVENOUS | Status: AC
  Administered 2017-07-11 – 2017-07-12 (×3): via INTRAVENOUS

## 2017-07-11 MED ORDER — ONDANSETRON HCL 4 MG PO TABS: 4.0000 mg | ORAL_TABLET | Freq: Four times a day (QID) | ORAL | Status: DC | PRN

## 2017-07-11 MED ORDER — PENTOXIFYLLINE ER 400 MG PO TBCR
400.0000 mg | EXTENDED_RELEASE_TABLET | Freq: Every day | ORAL | Status: DC
  Administered 2017-07-12 – 2017-07-14 (×3): 400 mg via ORAL
  Filled 2017-07-11 (×4): qty 1

## 2017-07-11 MED ORDER — ENOXAPARIN SODIUM 40 MG/0.4ML ~~LOC~~ SOLN
40.0000 mg | SUBCUTANEOUS | Status: DC
  Administered 2017-07-12 – 2017-07-14 (×3): 40 mg via SUBCUTANEOUS
  Filled 2017-07-11 (×4): qty 0.4

## 2017-07-11 MED ORDER — LORAZEPAM 2 MG/ML IJ SOLN
0.5000 mg | Freq: Once | INTRAMUSCULAR | Status: AC
  Administered 2017-07-11: 0.5 mg via INTRAVENOUS
  Filled 2017-07-11: qty 1

## 2017-07-11 MED ORDER — ENSURE ENLIVE PO LIQD
237.0000 mL | Freq: Two times a day (BID) | ORAL | Status: DC
  Administered 2017-07-12 – 2017-07-13 (×3): 237 mL via ORAL

## 2017-07-11 NOTE — ED Triage Notes (Signed)
Patient BIB EMS from PCP office for suspected UTI. Patient usually stays at [facility] on the Dementia memory care unit. Patient's family reports patient has become more tired and weak since Tuesday. Patient family states they think the patient has a UTI. Patient is at baseline neurological status, per family, aside from the increased weakness.

## 2017-07-11 NOTE — ED Notes (Signed)
Pt called out to use bathroom, placed Pt on bedpan, Pt was unable to urinate. Pt seemed confused while on bedpan and said she didn't know why she was on it because she did not have to use bathroom. Gave Pt fresh depends, no output was able to be collected.

## 2017-07-11 NOTE — ED Notes (Signed)
This nurse and Zera Markwardt,NT attempted x2 for I&O catheter placement. Patient too agitated to collect sample at this time. EDMD notified

## 2017-07-11 NOTE — H&P (Signed)
History and Physical    [name] MWN:[id number] DOB: [birth year] DOA: 07/11/2017  PCP: [name], MD   Patient coming from: Independent/assisted living facility [facility] to dementia memory care  Chief Complaint: Weakness, cough, low-grade fevers  HPI: [name] is a 84 y.o. female with medical history significant HTN, PVD, depression, dementia, adenocarcinoma of the ovary who was brought to the ED via EMS from independent living facility reports of weakness, patient could not stand, also with low-grade fevers, cough.  Most of the history is obtained from chart review, as patient is sleeping, arousable, but not able to give a full history, has dementia.  Family not present at bedside. Patient does endorse a cough. Patient was recently discharged from the hospital- 2/6- 2/15, was managed for cholelithiasis had ERCP with sphincterotomy, also E. coli bacteremia treated with Zosyn and then Augmentin, also one episode of A. fib with RVR.  Patient was discharged to [facility] skilled nursing facility and then subsequently returned back to [facility] within the past week. PCPs office was called and recommended patient be sent to ED for possible UTI.  ED Course: Stable vitals.  UA-clean, two-view chest x-ray mild bronchitic changes small bilateral pleural effusions.  WBC normal 4.6 hemoglobin 15.3, creatinine close to baseline 0.9 BUN 28.  Urine culture was sent. Hospitalist was called to admit for generalized weakness-  inability to walk and dehydration.  Review of Systems: Unable to obtain, patient sleeping, dementia.  Past Medical History:  Diagnosis Date  . Aortic regurgitation   . Atrial fibrillation ([place])   . Dementia   . Depression   . Gallstone pancreatitis   . Hyperlipidemia   . Hypertension   . Ovarian cancer [facility])     Past Surgical History:  Procedure Laterality Date  . ABDOMINAL HYSTERECTOMY    . ERCP N/A 06/14/2017   Procedure: ENDOSCOPIC  RETROGRADE CHOLANGIOPANCREATOGRAPHY (ERCP);  Surgeon: Doran Stabler, MD;  Location: [facility];  Service: Gastroenterology;  Laterality: N/A;     reports that  has never smoked. she has never used smokeless tobacco. She reports that she does not drink alcohol or use drugs.  Allergies  Allergen Reactions  . Hydrocodone     Family History  Problem Relation Age of Onset  . COPD Father     Prior to Admission medications   Medication Sig Start Date End Date Taking? Authorizing Provider  alendronate (FOSAMAX) 70 MG tablet Take 70 mg by mouth once a week.  02/12/15  Yes [provider]  Amino Acids-Protein Hydrolys (FEEDING SUPPLEMENT, PRO-STAT SUGAR FREE 64,) LIQD Take 30 mLs by mouth 2 (two) times daily. 06/18/17  Yes Jani Gravel, MD  amLODipine (NORVASC) 5 MG tablet Take 1 tablet (5 mg total) by mouth daily. 06/20/17  Yes Jani Gravel, MD  aspirin EC 81 MG EC tablet Take 1 tablet (81 mg total) by mouth daily. 06/18/17  Yes Jani Gravel, MD  docusate sodium (COLACE) 100 MG capsule Take 1 capsule (100 mg total) by mouth daily. 06/18/17  Yes Jani Gravel, MD  donepezil (ARICEPT) 10 MG tablet Take 10 mg by mouth at bedtime.  02/10/15  Yes [provider]  ergocalciferol (VITAMIN D2) 50000 units capsule Take 50,000 Units by mouth every 14 (fourteen) days.   Yes [provider]  feeding supplement (BOOST HIGH PROTEIN) LIQD Take 1 Container by mouth daily.   Yes [provider]  metoprolol tartrate (LOPRESSOR) 25 MG tablet Take 1 tablet (25 mg total) by  mouth 2 (two) times daily. 06/20/17  Yes Jani Gravel, MD  Oyster Shell (OYSTER CALCIUM) 500 MG TABS tablet Take 500 mg of elemental calcium by mouth 2 (two) times daily.   Yes [provider]  pentoxifylline (TRENTAL) 400 MG CR tablet Take 400 mg by mouth daily.  02/04/15  Yes [provider]  pravastatin (PRAVACHOL) 80 MG tablet Take 80 mg by mouth daily.  02/05/15  Yes [provider]    SENNOSIDES PO Take 2 tablets by mouth daily.   Yes [provider]  venlafaxine XR (EFFEXOR-XR) 75 MG 24 hr capsule Take 75 mg by mouth daily with breakfast.  01/28/15  Yes [provider]  HYDROcodone-acetaminophen (NORCO/VICODIN) 5-325 MG tablet Take 1 tablet by mouth every 4 (four) hours as needed for moderate pain or severe pain.  12/07/14   [provider]  saccharomyces boulardii (FLORASTOR) 250 MG capsule Take 1 capsule (250 mg total) by mouth 2 (two) times daily. Patient not taking: Reported on 07/11/2017 06/18/17   Jani Gravel, MD    Physical Exam: Exam limited by patient's dementia patient sleeping Vitals:   07/11/17 1629 07/11/17 1740 07/11/17 1900 07/11/17 2030  BP: (!) 143/63 (!) 138/55 (!) 148/59 (!) 149/54  Pulse: 80 81 72 85  Resp: (!) 23  (!) 22 (!) 23  Temp: 98.1 F (36.7 C)     TempSrc: Oral     SpO2: 96% 96% 96% 95%  Weight: 55.8 kg (123 lb)     Height: 5\' 7"  (1.702 m)       Constitutional: Sleeping but arousable Vitals:   07/11/17 1629 07/11/17 1740 07/11/17 1900 07/11/17 2030  BP: (!) 143/63 (!) 138/55 (!) 148/59 (!) 149/54  Pulse: 80 81 72 85  Resp: (!) 23  (!) 22 (!) 23  Temp: 98.1 F (36.7 C)     TempSrc: Oral     SpO2: 96% 96% 96% 95%  Weight: 55.8 kg (123 lb)     Height: 5\' 7"  (1.702 m)      Eyes: PERRL, lids and conjunctivae normal ENMT: Mucous membranes are dry. Posterior pharynx clear of any exudate or lesions.Normal dentition.  Neck: normal, supple, no masses, no thyromegaly Respiratory: clear to auscultation bilaterally, no wheezing, no crackles. Normal respiratory effort. No accessory muscle use.  Cardiovascular: Regular rate and rhythm, no murmurs / rubs / gallops. No extremity edema. 2+ pedal pulses. No carotid bruits.  Abdomen: no tenderness, no masses palpated. No hepatosplenomegaly. Bowel sounds positive.  Musculoskeletal: no clubbing / cyanosis.  Right knee larger than left, no palpable effusion , non tender no  edema, good ROM, no contractures. Normal muscle tone.  Skin: no rashes, lesions, ulcers. No induration Neurologic: Limited exam,. moving all extremities spontaneously Psychiatric: Normal judgment and insight. Alert and oriented x 3. Normal mood.   Labs on Admission: I have personally reviewed following labs and imaging studies  CBC: Recent Labs  Lab 07/11/17 1647  WBC 4.6  NEUTROABS 2.9  HGB 15.3*  HCT 46.6*  MCV 92.6  PLT 782   Basic Metabolic Panel: Recent Labs  Lab 07/11/17 1647  NA 139  K 4.2  CL 98*  CO2 29  GLUCOSE 108*  BUN 28*  CREATININE 0.93  CALCIUM 9.6   Liver Function Tests: Recent Labs  Lab 07/11/17 1647  AST 30  ALT 13*  ALKPHOS 109  BILITOT 0.3  PROT 7.6  ALBUMIN 3.6   Recent Labs  Lab 07/11/17 1647  LIPASE 25   Urine  analysis:    Component Value Date/Time   COLORURINE YELLOW 07/11/2017 1932   APPEARANCEUR CLEAR 07/11/2017 1932   LABSPEC 1.020 07/11/2017 1932   PHURINE 5.0 07/11/2017 1932   GLUCOSEU NEGATIVE 07/11/2017 1932   HGBUR SMALL (A) 07/11/2017 1932   BILIRUBINUR NEGATIVE 07/11/2017 1932   KETONESUR 5 (A) 07/11/2017 1932   PROTEINUR 30 (A) 07/11/2017 1932   UROBILINOGEN 0.2 01/23/2009 1153   NITRITE NEGATIVE 07/11/2017 1932   LEUKOCYTESUR NEGATIVE 07/11/2017 1932    Radiological Exams on Admission: Dg Chest 2 View  Result Date: 07/11/2017 CLINICAL DATA:  Per order- fatigue Patient usually stays at [facility] on the Dementia memory care unit. Patient's family reports patient has become more tired and weak since Tuesday. PT HXL HTN, non smoker EXAM: CHEST - 2 VIEW COMPARISON:  06/12/2017 FINDINGS: Heart size is normal. There is mild perihilar peribronchial thickening. There is atherosclerotic calcification of the thoracic aorta. No there are no focal consolidations. Small bilateral pleural effusions are present. Multiple midthoracic wedge compression fractures are chronic. IMPRESSION: 1. Mild bronchitic changes. 2. Small bilateral  pleural effusions. Electronically Signed   By: Nolon Nations M.D.   On: 07/11/2017 19:30   EKG: None  Assessment/Plan Principal Problem:   Generalized weakness Active Problems:   HYPERTENSION, BENIGN   PERIPHERAL VASCULAR DISEASE   Generalized weakness- with Cough, history of low-grade fevers.  Mild tachypnea. WBC- 4.6. UA clean chest x-ray bronchitic changes.  BUN mildly elevated 28, hgb on the higher side -15 but stable creatinine, dry mucous membranes, possibly mild dehydration.  1 L bolus given in the ED. -Continue IV fluids NS 100cc/hr X 12 hrs -PT evaluation -Will check influenza- POSITIVE -Mucolytics, Albuterol  -Start Tamiflu  HTN-stable  -Continue home meds metoprolol 25 twice daily, Norvasc 5  PVD -Continue home pentoxifylline  DVT prophylaxis: Lovenox Code Status: Full -will need to clarify with daughter in a.m Family Communication: No family at bedside Disposition Plan: 1-2 days Consults called: none Admission status: obs, med surg   Bethena Roys MD [facility] Pager [phone] From 6PM-2AM.  Otherwise please contact night-coverage [email or web address] Password [facility]  07/11/2017, 9:13 PM

## 2017-07-11 NOTE — ED Provider Notes (Signed)
[facility] DEPT Provider Note  CSN: [id number] Arrival date & time: 07/11/17  1608    History   Chief Complaint Chief Complaint  Patient presents with  . Fatigue    HPI [name] is a 84 y.o. female.  HPI Pt was back at her living facility since being hospitalized in February for choledocholithiasis.   over the last week or so she has been getting weaker.  She has had low grade fevers.  She has been listless and her urine has a bad odor.  SHe has been coughing. She normally is able to walk but she has not been able to the last day or so.  No vomiting or diarrhea.  Some low-grade fevers.  No chest pain or shortness of breath.  She went to her doctor today and she was sent  Here.  Past Medical History:  Diagnosis Date  . Aortic regurgitation   . Atrial fibrillation ([place])   . Dementia   . Depression   . Gallstone pancreatitis   . Hyperlipidemia   . Hypertension   . Ovarian cancer [facility])     Patient Active Problem List   Diagnosis Date Noted  . Cholangitis   . Choledocholithiasis   . Fever 06/12/2017  . ADENOCARCINOMA, OVARY 07/14/2008  . ANEMIA 07/14/2008  . DEPRESSION 07/14/2008  . PERIPHERAL VASCULAR DISEASE 07/14/2008  . GERD 07/14/2008  . MELENA 07/14/2008  . OSTEOARTHRITIS 07/14/2008  . OSTEOPOROSIS 07/14/2008  . PANCREATITIS, HX OF 07/14/2008  . ESOPHAGITIS, HX OF 07/14/2008  . HYPERLIPIDEMIA TYPE IIB / III 04/16/2008  . HYPERTENSION, BENIGN 04/16/2008  . LIMB PAIN 04/16/2008  . CHEST PAIN-UNSPECIFIED 04/16/2008    Past Surgical History:  Procedure Laterality Date  . ABDOMINAL HYSTERECTOMY    . ERCP N/A 06/14/2017   Procedure: ENDOSCOPIC RETROGRADE CHOLANGIOPANCREATOGRAPHY (ERCP);  Surgeon: Doran Stabler, MD;  Location: [facility];  Service: Gastroenterology;  Laterality: N/A;    OB History    No data available       Home Medications    Prior to Admission medications   Medication Sig Start Date End Date  Taking? Authorizing Provider  alendronate (FOSAMAX) 70 MG tablet Take 70 mg by mouth once a week.  02/12/15  Yes [provider]  Amino Acids-Protein Hydrolys (FEEDING SUPPLEMENT, PRO-STAT SUGAR FREE 64,) LIQD Take 30 mLs by mouth 2 (two) times daily. 06/18/17  Yes Jani Gravel, MD  amLODipine (NORVASC) 5 MG tablet Take 1 tablet (5 mg total) by mouth daily. 06/20/17  Yes Jani Gravel, MD  aspirin EC 81 MG EC tablet Take 1 tablet (81 mg total) by mouth daily. 06/18/17  Yes Jani Gravel, MD  docusate sodium (COLACE) 100 MG capsule Take 1 capsule (100 mg total) by mouth daily. 06/18/17  Yes Jani Gravel, MD  donepezil (ARICEPT) 10 MG tablet Take 10 mg by mouth at bedtime.  02/10/15  Yes [provider]  ergocalciferol (VITAMIN D2) 50000 units capsule Take 50,000 Units by mouth every 14 (fourteen) days.   Yes [provider]  feeding supplement (BOOST HIGH PROTEIN) LIQD Take 1 Container by mouth daily.   Yes [provider]  metoprolol tartrate (LOPRESSOR) 25 MG tablet Take 1 tablet (25 mg total) by mouth 2 (two) times daily. 06/20/17  Yes Jani Gravel, MD  Oyster Shell (OYSTER CALCIUM) 500 MG TABS tablet Take 500 mg of elemental calcium by mouth 2 (two) times daily.   Yes [provider]  pentoxifylline (TRENTAL) 400 MG CR  tablet Take 400 mg by mouth daily.  02/04/15  Yes [provider]  pravastatin (PRAVACHOL) 80 MG tablet Take 80 mg by mouth daily.  02/05/15  Yes [provider]  SENNOSIDES PO Take 2 tablets by mouth daily.   Yes [provider]  venlafaxine XR (EFFEXOR-XR) 75 MG 24 hr capsule Take 75 mg by mouth daily with breakfast.  01/28/15  Yes [provider]  HYDROcodone-acetaminophen (NORCO/VICODIN) 5-325 MG tablet Take 1 tablet by mouth every 4 (four) hours as needed for moderate pain or severe pain.  12/07/14   [provider]  saccharomyces boulardii (FLORASTOR) 250 MG capsule Take 1 capsule (250 mg total) by mouth 2 (two)  times daily. Patient not taking: Reported on 07/11/2017 06/18/17   Jani Gravel, MD    Family History Family History  Problem Relation Age of Onset  . COPD Father     Social History Social History   Tobacco Use  . Smoking status: Never Smoker  . Smokeless tobacco: Never Used  Substance Use Topics  . Alcohol use: No    Alcohol/week: 0.0 oz  . Drug use: No     Allergies   Hydrocodone   Review of Systems Review of Systems  All other systems reviewed and are negative.    Physical Exam Updated Vital Signs BP (!) 148/59   Pulse 72   Temp 98.1 F (36.7 C) (Oral)   Resp (!) 22   Ht 1.702 m (5\' 7" )   Wt 55.8 kg (123 lb)   SpO2 96%   BMI 19.26 kg/m   Physical Exam  Constitutional: No distress.  Elderly, frail  HENT:  Head: Normocephalic and atraumatic.  Right Ear: External ear normal.  Left Ear: External ear normal.  Eyes: Conjunctivae are normal. Right eye exhibits no discharge. Left eye exhibits no discharge. No scleral icterus.  Neck: Neck supple. No tracheal deviation present.  Cardiovascular: Normal rate, regular rhythm and intact distal pulses.  Pulmonary/Chest: Effort normal. No stridor. No respiratory distress. She has no wheezes. She has rales.  Abdominal: Soft. Bowel sounds are normal. She exhibits no distension. There is no tenderness. There is no rebound and no guarding.  Musculoskeletal: She exhibits no edema or tenderness.  Neurological: She is alert. She has normal strength. No cranial nerve deficit (no facial droop, extraocular movements intact, no slurred speech) or sensory deficit. She exhibits normal muscle tone. She displays no seizure activity. Coordination normal.  Movements are slow however patient is able to lift both legs off the bed, she is able to squeeze  my hands equally  Skin: Skin is warm and dry. No rash noted.  Psychiatric: She has a normal mood and affect.  Nursing note and vitals reviewed.    ED Treatments / Results  Labs (all  labs ordered are listed, but only abnormal results are displayed) Labs Reviewed  CBC WITH DIFFERENTIAL/PLATELET - Abnormal; Notable for the following components:      Result Value   Hemoglobin 15.3 (*)    HCT 46.6 (*)    All other components within normal limits  COMPREHENSIVE METABOLIC PANEL - Abnormal; Notable for the following components:   Chloride 98 (*)    Glucose, Bld 108 (*)    BUN 28 (*)    ALT 13 (*)    GFR calc non Af Amer 50 (*)    GFR calc Af Amer 58 (*)    All other components within normal limits  URINALYSIS, ROUTINE W REFLEX MICROSCOPIC - Abnormal;  Notable for the following components:   Hgb urine dipstick SMALL (*)    Ketones, ur 5 (*)    Protein, ur 30 (*)    Squamous Epithelial / LPF 0-5 (*)    All other components within normal limits  URINE CULTURE  LIPASE, BLOOD     Radiology Dg Chest 2 View  Result Date: 07/11/2017 CLINICAL DATA:  Per order- fatigue Patient usually stays at [facility] on the Dementia memory care unit. Patient's family reports patient has become more tired and weak since Tuesday. PT HXL HTN, non smoker EXAM: CHEST - 2 VIEW COMPARISON:  06/12/2017 FINDINGS: Heart size is normal. There is mild perihilar peribronchial thickening. There is atherosclerotic calcification of the thoracic aorta. No there are no focal consolidations. Small bilateral pleural effusions are present. Multiple midthoracic wedge compression fractures are chronic. IMPRESSION: 1. Mild bronchitic changes. 2. Small bilateral pleural effusions. Electronically Signed   By: Nolon Nations M.D.   On: 07/11/2017 19:30    Procedures Procedures (including critical care time)  Medications Ordered in ED Medications  sodium chloride 0.9 % bolus 1,000 mL (1,000 mLs Intravenous New Bag/Given 07/11/17 1900)  LORazepam (ATIVAN) injection 0.5 mg (0.5 mg Intravenous Given 07/11/17 1923)     Initial Impression / Assessment and Plan / ED Course  I have reviewed the triage vital signs and the  nursing notes.  Pertinent labs & imaging results that were available during my care of the patient were reviewed by me and considered in my medical decision making (see chart for details).  Clinical Course as of Jul 11 2052  Thu Jul 11, 2017  1636 Sinus rhythm,bundle branch block pattern on monitor, rate 80  [JK]  1853 Nurses unable to get urine.  Pt is fighting the cath specimen.   Will give a dose of ativan  [JK]    Clinical Course User Index [JK] Dorie Rank, MD    Patient presented to the emergency room for evaluation of progressive weakness.  Despite the patient's advanced age she is residing in an independent living facility.  Patient's had increasing weakness over the past week.  Daughter states she has not been eating or drinking well.  Her laboratory tests are reassuring however does suggest a component of dehydration with her elevated BUN.  No signs of anemia.  I doubt sepsis.  Patient does not have any focal neurologic deficits to suggest stroke however she is having generalized weakness and difficulty standing.  Considering her inability to walk think is reasonable to bring her in for IV fluids and hydration see if her symptoms improved.  Final Clinical Impressions(s) / ED Diagnoses   Final diagnoses:  Dehydration  Weakness      Dorie Rank, MD 07/11/17 2057

## 2017-07-11 NOTE — Patient Outreach (Signed)
[facility]) Care Management  07/11/2017  [name] 1918/05/10 [id number]   Referral Date: 07/08/17 Referral Source: [employer] Report Date of Admission:  06/21/17 Diagnosis: Choledocholithiasis Date of Discharge: 07/05/17 Facility: [place]: [employer]  Telephone call to daughter [name] who is DPR.  She is able to verify HIPAA.  She verifies that patient is back at [facility].  She states that patient has done well but the last few days the patient had a low grade fever. She states that the facility is to do a urine culture to check for UTI to explain low grade fever.  Patient has seen primary doctor since being out of the hospital.   Discussed with daughter [facility] Care Management services and working with facility to coordinate care if needed.  Daughter verbalized understanding and thankful for the offer.  She is agreeable to receive letter and brochure for future reference.   Plan: RN CM will close case and notify care management assistant of case status.    Jone Baseman, RN, MSN [facility] Care Management Care Management Coordinator Direct Line [phone] Toll Free: [phone]  Fax: [phone]

## 2017-07-11 NOTE — ED Notes (Signed)
ED TO INPATIENT HANDOFF REPORT  Name/Age/Gender [name] 84 y.o. female  Code Status Code Status History    Date Active Date Inactive Code Status Order ID Comments User Context   06/12/2017 18:41 06/21/2017 19:51 DNR [id number]  Jani Gravel, MD Inpatient   06/12/2017 17:03 06/12/2017 18:41 Full Code [id number]  Jani Gravel, MD Inpatient    Questions for Most Recent Historical Code Status (Order [id number])    Question Answer Comment   In the event of cardiac or respiratory ARREST Do not call a "code blue"    In the event of cardiac or respiratory ARREST Do not perform Intubation, CPR, defibrillation or ACLS    In the event of cardiac or respiratory ARREST Use medication by any route, position, wound care, and other measures to relive pain and suffering. May use oxygen, suction and manual treatment of airway obstruction as needed for comfort.         Advance Directive Documentation     Most Recent Value  Type of Advance Directive  Healthcare Power of Attorney  Pre-existing out of facility DNR order (yellow form or pink MOST form)  No data  "MOST" Form in Place?  No data      Home/SNF/Other Nursing Home  Chief Complaint Possible UTI  Level of Care/Admitting Diagnosis ED Disposition    ED Disposition Condition [facility] [facility]: [facility] [100102]  Level of Care: Med-Surg [16]  Diagnosis: Generalized weakness [209470]  Admitting Physician: Bethena Roys [[id number]]  Attending Physician: Bethena Roys [phone]  PT Class (Do Not Modify): Observation [104]  PT Acc Code (Do Not Modify): Observation [10022]       Medical History Past Medical History:  Diagnosis Date  . Aortic regurgitation   . Atrial fibrillation ([place])   . Dementia   . Depression   . Gallstone pancreatitis   . Hyperlipidemia   . Hypertension   . Ovarian cancer (HCC)     Allergies Allergies  Allergen Reactions  . Hydrocodone     IV  Location/Drains/Wounds Patient Lines/Drains/Airways Status   Active Line/Drains/Airways    Name:   Placement date:   Placement time:   Site:   Days:   External Urinary Catheter   06/16/17    0306    -   25          Labs/Imaging Results for orders placed or performed during the hospital encounter of 07/11/17 (from the past 48 hour(s))  CBC with Differential     Status: Abnormal   Collection Time: 07/11/17  4:47 PM  Result Value Ref Range   WBC 4.6 4.0 - 10.5 K/uL   RBC 5.03 3.87 - 5.11 MIL/uL   Hemoglobin 15.3 (H) 12.0 - 15.0 g/dL   HCT 46.6 (H) 36.0 - 46.0 %   MCV 92.6 78.0 - 100.0 fL   MCH 30.4 26.0 - 34.0 pg   MCHC 32.8 30.0 - 36.0 g/dL   RDW 14.5 11.5 - 15.5 %   Platelets 232 150 - 400 K/uL   Neutrophils Relative % 62 %   Neutro Abs 2.9 1.7 - 7.7 K/uL   Lymphocytes Relative 24 %   Lymphs Abs 1.1 0.7 - 4.0 K/uL   Monocytes Relative 14 %   Monocytes Absolute 0.6 0.1 - 1.0 K/uL   Eosinophils Relative 0 %   Eosinophils Absolute 0.0 0.0 - 0.7 K/uL   Basophils Relative 0 %   Basophils Absolute 0.0 0.0 - 0.1 K/uL  Comment: Performed at [facility], [place] [address], [place], [place] [address]  Comprehensive metabolic panel     Status: Abnormal   Collection Time: 07/11/17  4:47 PM  Result Value Ref Range   Sodium 139 135 - 145 mmol/L   Potassium 4.2 3.5 - 5.1 mmol/L   Chloride 98 (L) 101 - 111 mmol/L   CO2 29 22 - 32 mmol/L   Glucose, Bld 108 (H) 65 - 99 mg/dL   BUN 28 (H) 6 - 20 mg/dL   Creatinine, Ser 0.93 0.44 - 1.00 mg/dL   Calcium 9.6 8.9 - 10.3 mg/dL   Total Protein 7.6 6.5 - 8.1 g/dL   Albumin 3.6 3.5 - 5.0 g/dL   AST 30 15 - 41 U/L   ALT 13 (L) 14 - 54 U/L   Alkaline Phosphatase 109 38 - 126 U/L   Total Bilirubin 0.3 0.3 - 1.2 mg/dL   GFR calc non Af Amer 50 (L) >60 mL/min   GFR calc Af Amer 58 (L) >60 mL/min    Comment: (NOTE) The eGFR has been calculated using the CKD EPI equation. This calculation has not been validated in all clinical  situations. eGFR's persistently <60 mL/min signify possible Chronic Kidney Disease.    Anion gap 12 5 - 15    Comment: Performed at [facility], [place] [address], [place], [place] [address]  Lipase, blood     Status: None   Collection Time: 07/11/17  4:47 PM  Result Value Ref Range   Lipase 25 11 - 51 U/L    Comment: Performed at [facility], [place] [address], [place], [place] [address]  Urinalysis, Routine w reflex microscopic     Status: Abnormal   Collection Time: 07/11/17  7:32 PM  Result Value Ref Range   Color, Urine YELLOW YELLOW   APPearance CLEAR CLEAR   Specific Gravity, Urine 1.020 1.005 - 1.030   pH 5.0 5.0 - 8.0   Glucose, UA NEGATIVE NEGATIVE mg/dL   Hgb urine dipstick SMALL (A) NEGATIVE   Bilirubin Urine NEGATIVE NEGATIVE   Ketones, ur 5 (A) NEGATIVE mg/dL   Protein, ur 30 (A) NEGATIVE mg/dL   Nitrite NEGATIVE NEGATIVE   Leukocytes, UA NEGATIVE NEGATIVE   RBC / HPF 0-5 0 - 5 RBC/hpf   WBC, UA 0-5 0 - 5 WBC/hpf   Bacteria, UA NONE SEEN NONE SEEN   Squamous Epithelial / LPF 0-5 (A) NONE SEEN   Mucus PRESENT    Hyaline Casts, UA PRESENT     Comment: Performed at [facility], [place] [address] [place], [place] [address]   Dg Chest 2 View  Result Date: 07/11/2017 CLINICAL DATA:  Per order- fatigue Patient usually stays at [facility] on the Dementia memory care unit. Patient's family reports patient has become more tired and weak since Tuesday. PT HXL HTN, non smoker EXAM: CHEST - 2 VIEW COMPARISON:  06/12/2017 FINDINGS: Heart size is normal. There is mild perihilar peribronchial thickening. There is atherosclerotic calcification of the thoracic aorta. No there are no focal consolidations. Small bilateral pleural effusions are present. Multiple midthoracic wedge compression fractures are chronic. IMPRESSION: 1. Mild bronchitic changes. 2. Small bilateral pleural effusions. Electronically Signed   By: Nolon Nations M.D.    On: 07/11/2017 19:30    Pending Labs Unresulted Labs (From admission, onward)   Start     Ordered   07/11/17 2108  Influenza panel by PCR (type A & B)  (  Influenza PCR Panel)  Once,   R     07/11/17 2107   07/11/17 1648  Urine Culture  Once,   STAT     07/11/17 1648      Vitals/Pain Today's Vitals   07/11/17 1629 07/11/17 1740 07/11/17 1900 07/11/17 2030  BP: (!) 143/63 (!) 138/55 (!) 148/59 (!) 149/54  Pulse: 80 81 72 85  Resp: (!) 23  (!) 22 (!) 23  Temp: 98.1 F (36.7 C)     TempSrc: Oral     SpO2: 96% 96% 96% 95%  Weight: 123 lb (55.8 kg)     Height: '5\' 7"'$  (1.702 m)       Isolation Precautions Droplet precaution  Medications Medications  sodium chloride 0.9 % bolus 1,000 mL (1,000 mLs Intravenous New Bag/Given 07/11/17 1900)  LORazepam (ATIVAN) injection 0.5 mg (0.5 mg Intravenous Given 07/11/17 1923)    Mobility non-ambulatory

## 2017-07-12 DIAGNOSIS — J111 Influenza due to unidentified influenza virus with other respiratory manifestations: Secondary | ICD-10-CM | POA: Diagnosis not present

## 2017-07-12 DIAGNOSIS — I10 Essential (primary) hypertension: Secondary | ICD-10-CM

## 2017-07-12 DIAGNOSIS — E860 Dehydration: Secondary | ICD-10-CM | POA: Diagnosis not present

## 2017-07-12 DIAGNOSIS — R531 Weakness: Secondary | ICD-10-CM

## 2017-07-12 LAB — SURGICAL PCR SCREEN
MRSA, PCR: NEGATIVE
Staphylococcus aureus: NEGATIVE

## 2017-07-12 MED ORDER — GUAIFENESIN ER 600 MG PO TB12
600.0000 mg | ORAL_TABLET | Freq: Two times a day (BID) | ORAL | Status: DC
  Administered 2017-07-12 – 2017-07-14 (×5): 600 mg via ORAL
  Filled 2017-07-12 (×7): qty 1

## 2017-07-12 MED ORDER — DM-GUAIFENESIN ER 30-600 MG PO TB12
1.0000 | ORAL_TABLET | Freq: Two times a day (BID) | ORAL | Status: DC
  Administered 2017-07-12: 1 via ORAL
  Filled 2017-07-12: qty 1

## 2017-07-12 MED ORDER — ALBUTEROL SULFATE (2.5 MG/3ML) 0.083% IN NEBU: 2.5000 mg | INHALATION_SOLUTION | Freq: Three times a day (TID) | RESPIRATORY_TRACT | Status: DC | PRN

## 2017-07-12 MED ORDER — OSELTAMIVIR PHOSPHATE 75 MG PO CAPS: 75.0000 mg | ORAL_CAPSULE | Freq: Every day | ORAL | Status: DC

## 2017-07-12 MED ORDER — OSELTAMIVIR PHOSPHATE 30 MG PO CAPS
30.0000 mg | ORAL_CAPSULE | Freq: Two times a day (BID) | ORAL | Status: DC
  Administered 2017-07-12 – 2017-07-14 (×6): 30 mg via ORAL
  Filled 2017-07-12 (×9): qty 1

## 2017-07-12 NOTE — Progress Notes (Signed)
PHARMACY NOTE:  ANTIMICROBIAL RENAL DOSAGE ADJUSTMENT  Current antimicrobial regimen includes a mismatch between antimicrobial dosage and estimated renal function.  As per policy approved by the Pharmacy & Therapeutics and Medical Executive Committees, the antimicrobial dosage will be adjusted accordingly.  Current antimicrobial dosage:  tamiflu 75 mg PO daily x 5 days  Indication: influenza  Renal Function:  Estimated Creatinine Clearance: 31.2 mL/min (by C-G formula based on SCr of 0.93 mg/dL). []      On intermittent HD, scheduled: []      On CRRT    Antimicrobial dosage has been changed to:  Tamiflu 30 mg PO BID x 5 days   Additional comments:   Thank you for allowing pharmacy to be a part of this patient's care.  Royetta Asal, PharmD, BCPS Pager [phone] 07/12/2017 1:57 AM

## 2017-07-12 NOTE — Evaluation (Signed)
Physical Therapy Evaluation Patient Details Name: [name] MRN: [id number] DOB: 12-21-1918 Today's Date: 07/12/2017   History of Present Illness  84 year old Caucasian female with a past medical history of hypertension, peripheral vascular disease, dementia, recent hospitalization for choledocholithiasis status post ERCP.  She was discharged to skilled nursing facility after that hospitalization and then went back to her assisted living facility.  Admitted with  a few day history of generalized weakness lethargy low-grade fever and cough. Dx of flu.   Clinical Impression  Pt admitted with above diagnosis. Pt currently with functional limitations due to the deficits listed below (see PT Problem List). Max assist for bed mobility, max A to stand, MOd A to pivot to recliner. SaO2 94% room air with activity. Pt oriented to self only.  Pt will benefit from skilled PT to increase their independence and safety with mobility to allow discharge to the venue listed below.       Follow Up Recommendations SNF (from ALF, will likely need higher level of care)    Equipment Recommendations  None recommended by PT    Recommendations for Other Services       Precautions / Restrictions Precautions Precautions: Fall Precaution Comments: dementia  Restrictions Weight Bearing Restrictions: No      Mobility  Bed Mobility   Bed Mobility: Supine to Sit     Supine to sit: Max assist     General bed mobility comments: assist to raise trunk and pivot hips to edge of bed  Transfers Overall transfer level: Needs assistance Equipment used: Rolling walker (2 wheeled) Transfers: Sit to/from [facility] Sit to Stand: Max assist Stand pivot transfers: Mod assist       General transfer comment: unable to come to full stand with RW and max A so performed stand pivot transfer to recliner, pt has posterior lean  Ambulation/Gait                Stairs             Wheelchair Mobility    Modified Rankin (Stroke Patients Only)       Balance   Sitting-balance support: Bilateral upper extremity supported;Feet supported Sitting balance-Leahy Scale: Poor Sitting balance - Comments: required Mod-MaxA to initially reach midline, then able to stabilize with Min guard at midilne      Standing balance-Leahy Scale: Zero                               Pertinent Vitals/Pain Pain Assessment: Faces Faces Pain Scale: No hurt    Home Living                   Additional Comments: per chart, at baseline patient is from an ALF where she was highly independent and mobile    Prior Function Level of Independence: Independent with assistive device(s)         Comments: patient not able to provide history 2* dementia, information provided per chart review      Hand Dominance        Extremity/Trunk Assessment   Upper Extremity Assessment Upper Extremity Assessment: Generalized weakness    Lower Extremity Assessment Lower Extremity Assessment: Generalized weakness    Cervical / Trunk Assessment Cervical / Trunk Assessment: Kyphotic  Communication   Communication: No difficulties  Cognition Arousal/Alertness: Awake/alert Behavior During Therapy: WFL for tasks assessed/performed Overall Cognitive Status: No family/caregiver present to determine baseline cognitive functioning(h/o dementia)  General Comments: h/o cognitive impairments; no family available; pt oriented to self only, thinks she's in [place] Comments      Exercises     Assessment/Plan    PT Assessment Patient needs continued PT services  PT Problem List Decreased strength;Decreased mobility;Decreased safety awareness;Decreased coordination;Decreased activity tolerance;Decreased balance;Decreased knowledge of use of DME       PT Treatment Interventions DME instruction;Therapeutic  activities;Gait training;Therapeutic exercise;Patient/family education;Stair training;Balance training;Functional mobility training;Neuromuscular re-education    PT Goals (Current goals can be found in the Care Plan section)  Acute Rehab PT Goals Patient Stated Goal: none stated PT Goal Formulation: Patient unable to participate in goal setting Time For Goal Achievement: 07/26/17 Potential to Achieve Goals: Fair    Frequency Min 2X/week   Barriers to discharge        Co-evaluation               AM-PAC PT "6 Clicks" Daily Activity  Outcome Measure Difficulty turning over in bed (including adjusting bedclothes, sheets and blankets)?: Unable Difficulty moving from lying on back to sitting on the side of the bed? : Unable Difficulty sitting down on and standing up from a chair with arms (e.g., wheelchair, bedside commode, etc,.)?: Unable Help needed moving to and from a bed to chair (including a wheelchair)?: A Lot Help needed walking in hospital room?: Total Help needed climbing 3-5 steps with a railing? : Total 6 Click Score: 7    End of Session Equipment Utilized During Treatment: Gait belt Activity Tolerance: Patient tolerated treatment well Patient left: in chair;with call bell/phone within reach;with chair alarm set Nurse Communication: Mobility status PT Visit Diagnosis: Difficulty in walking, not elsewhere classified (R26.2);Other abnormalities of gait and mobility (R26.89);Unsteadiness on feet (R26.81)    Time: 2500-3704 PT Time Calculation (min) (ACUTE ONLY): 23 min   Charges:   PT Evaluation $PT Eval Moderate Complexity: 1 Mod PT Treatments $Therapeutic Activity: 8-22 mins   PT G Codes:          Philomena Doheny 07/12/2017, 1:26 PM [phone]

## 2017-07-12 NOTE — Progress Notes (Signed)
[facility] PROGRESS NOTE  [name] TWS:[id number] DOB: May 25, 1918 DOA: 07/11/2017  PCP: [name], MD  Brief History/Interval Summary: 84 year old Caucasian female with a past medical history of hypertension, peripheral vascular disease, dementia, recent hospitalization for choledocholithiasis status post ERCP.  She was discharged to skilled nursing facility after that hospitalization and then went back to her assisted living facility.  Sent over here because of a few day history of generalized weakness lethargy low-grade fever and cough.  Patient evaluation was positive for influenza.  She was hospitalized for further management.  Reason for Visit: Influenza  Consultants: None  Procedures: None  Antibiotics: Tamiflu  Subjective/Interval History: Patient pleasantly confused.  Denies any pain.  No shortness of breath.  ROS: No nausea or vomiting  Objective:  Vital Signs  Vitals:   07/11/17 1900 07/11/17 2030 07/11/17 2232 07/12/17 0646  BP: (!) 148/59 (!) 149/54 (!) 147/52 (!) 141/56  Pulse: 72 85 72 71  Resp: (!) 22 (!) 23 18   Temp:   99.1 F (37.3 C) 99.6 F (37.6 C)  TempSrc:   Axillary Axillary  SpO2: 96% 95% 97% 95%  Weight:   55.9 kg (123 lb 3.8 oz)   Height:   5\' 7"  (1.702 m)    No intake or output data in the 24 hours ending 07/12/17 1115 Filed Weights   07/11/17 1629 07/11/17 2232  Weight: 55.8 kg (123 lb) 55.9 kg (123 lb 3.8 oz)    General appearance: alert, cooperative, appears stated age and no distress Head: Normocephalic, without obvious abnormality, atraumatic Resp: Diminished air entry at the bases.  Few crackles.  No rhonchi or wheezing.  Normal effort at rest. Cardio: regular rate and rhythm, S1, S2 normal, no murmur, click, rub or gallop GI: Abdomen is soft.  Nontender nondistended.  Bowel sounds are present.  No masses organomegaly. Extremities: extremities normal, atraumatic, no cyanosis or edema Neurologic: No obvious  focal neurological deficits.  She is noted to be pleasantly confused.  Lab Results:  Data Reviewed: I have personally reviewed following labs and imaging studies  CBC: Recent Labs  Lab 07/11/17 1647  WBC 4.6  NEUTROABS 2.9  HGB 15.3*  HCT 46.6*  MCV 92.6  PLT 001    Basic Metabolic Panel: Recent Labs  Lab 07/11/17 1647  NA 139  K 4.2  CL 98*  CO2 29  GLUCOSE 108*  BUN 28*  CREATININE 0.93  CALCIUM 9.6    GFR: Estimated Creatinine Clearance: 31.2 mL/min (by C-G formula based on SCr of 0.93 mg/dL).  Liver Function Tests: Recent Labs  Lab 07/11/17 1647  AST 30  ALT 13*  ALKPHOS 109  BILITOT 0.3  PROT 7.6  ALBUMIN 3.6    Recent Labs  Lab 07/11/17 1647  LIPASE 25    Recent Results (from the past 240 hour(s))  Surgical pcr screen     Status: None   Collection Time: 07/12/17 12:51 AM  Result Value Ref Range Status   MRSA, PCR NEGATIVE NEGATIVE Final   Staphylococcus aureus NEGATIVE NEGATIVE Final    Comment: (NOTE) The Xpert SA Assay (FDA approved for NASAL specimens in patients 79 years of age and older), is one component of a comprehensive surveillance program. It is not intended to diagnose infection nor to guide or monitor treatment. Performed at [facility], [place] [address], [place],  [address]       Radiology Studies: Dg Chest 2 View  Result Date: 07/11/2017 CLINICAL DATA:  Per  order- fatigue Patient usually stays at [facility] on the Dementia memory care unit. Patient's family reports patient has become more tired and weak since Tuesday. PT HXL HTN, non smoker EXAM: CHEST - 2 VIEW COMPARISON:  06/12/2017 FINDINGS: Heart size is normal. There is mild perihilar peribronchial thickening. There is atherosclerotic calcification of the thoracic aorta. No there are no focal consolidations. Small bilateral pleural effusions are present. Multiple midthoracic wedge compression fractures are chronic. IMPRESSION: 1. Mild bronchitic  changes. 2. Small bilateral pleural effusions. Electronically Signed   By: Nolon Nations M.D.   On: 07/11/2017 19:30     Medications:  Scheduled: . amLODipine  5 mg Oral Daily  . aspirin EC  81 mg Oral Daily  . dextromethorphan-guaiFENesin  1 tablet Oral BID  . docusate sodium  100 mg Oral Daily  . donepezil  10 mg Oral QHS  . enoxaparin (LOVENOX) injection  40 mg Subcutaneous Q24H  . feeding supplement (ENSURE ENLIVE)  237 mL Oral BID BM  . metoprolol tartrate  25 mg Oral BID  . oseltamivir  30 mg Oral BID  . pentoxifylline  400 mg Oral Daily  . pravastatin  80 mg Oral Daily  . saccharomyces boulardii  250 mg Oral BID  . venlafaxine XR  75 mg Oral Q breakfast   Continuous: . sodium chloride 75 mL/hr at 07/12/17 1011   OEV:[id number], ondansetron **OR** ondansetron (ZOFRAN) IV  Assessment/Plan:  Principal Problem:   Generalized weakness Active Problems:   HYPERTENSION, BENIGN   PERIPHERAL VASCULAR DISEASE    Generalized weakness Most likely secondary to influenza.  No other infectious etiology noted.  She is was also noted to be mildly dehydrated with elevated BUN.  Creatinine was normal.  Continue with IV fluids at lower rate.  PT evaluation.  Acute influenza Most likely reason for her presentation.  Continue with Tamiflu.  Continue to monitor respiratory status.  Essential hypertension Continue home medication.  Pressure is reasonably well controlled.  Recent choledocholithiasis and concern for liver abscess She had small lesions noted in the liver.  She was given prolonged antibiotic course.  She was seen by gastroenterology and underwent ERCP and sphincterotomy.  LFTs were significantly elevated during that admission.  Noted to be normal during this admission.  Her abdomen is benign.  History of peripheral vascular disease Stable.  Continue home medications.  History of dementia Stable.  Continue home medications.  DVT Prophylaxis: Lovenox    Code Status:  DNR Family Communication: Discussed with the patient.  No family at bedside Disposition Plan: Management as outlined above.  Mobilize.  Hopefully discharge in the next 24 hours.    LOS: 0 days   [facility] Pager [phone] 07/12/2017, 11:15 AM  If 7PM-7AM, please contact night-coverage at [email or web address], password [facility]

## 2017-07-12 NOTE — Evaluation (Signed)
Occupational Therapy Evaluation Patient Details Name: [name] MRN: [id number] DOB: March 27, 1919 Today's Date: 07/12/2017    History of Present Illness 84 year old Caucasian female with a past medical history of hypertension, peripheral vascular disease, dementia, recent hospitalization for choledocholithiasis status post ERCP.  She was discharged to skilled nursing facility after that hospitalization and then went back to her assisted living facility.  Admitted with  a few day history of generalized weakness lethargy low-grade fever and cough. Dx of flu.    Clinical Impression   This 84 yo female admitted with above presents to acute OT with decreased balance and decreased mobility both affecting her PLOF of being able to dress and toilet herself as well as get around on her own with RW. She will benefit from acute OT with follow up OT at SNF to work back towards PLOF.    Follow Up Recommendations  SNF    Equipment Recommendations  Other (comment)(TBD at next venue)       Precautions / Restrictions Precautions Precautions: Fall Precaution Comments: dementia  Restrictions Weight Bearing Restrictions: No      Mobility Bed Mobility Overal bed mobility: Needs Assistance Bed Mobility: Rolling;Sidelying to Sit;Sit to Supine Rolling: Min assist Sidelying to sit: Mod assist Sit to supine: Mod assist   General bed mobility comments: Increased time and cues to roll left and right; able to get legs off of bed, but needed A to elevate trunk to come up to sit from flat bed; Mod A for legs to lay back down  Transfers Overall transfer level: Needs assistance Equipment used: Rolling walker (2 wheeled) Transfers: Sit to/from Stand Sit to Stand: Min assist;Mod assist       General transfer comment: with me standing in front of her pt Min A to intially stand partially up (3/4 way) but needed increased A to maintain standing as time progressed    Balance Overall balance assessment:  Needs assistance Sitting-balance support: Feet supported;Bilateral upper extremity supported Sitting balance-Leahy Scale: Poor Sitting balance - Comments: Pt Mod A initally for sitting balance progressed to min A   Standing balance support: Bilateral upper extremity supported Standing balance-Leahy Scale: Poor Standing balance comment: reilant on UE support with me in front of her to come 3/4 of way up and hold                            ADL either performed or assessed with clinical judgement   ADL Overall ADL's : Needs assistance/impaired Eating/Feeding: Bed level;Minimal assistance   Grooming: Bed level;Minimal assistance   Upper Body Bathing: Moderate assistance;Bed level   Lower Body Bathing: Total assistance;Bed level   Upper Body Dressing : Maximal assistance;Bed level   Lower Body Dressing: Total assistance;Bed level     Toilet Transfer Details (indicate cue type and reason): Sit>stand at EOB initially Min A (3/4 stand) then longer she stood Mod A Toileting- Clothing Manipulation and Hygiene: Total assistance Toileting - Clothing Manipulation Details (indicate cue type and reason): Mod A for partial stand             Vision Patient Visual Report: No change from baseline              Pertinent Vitals/Pain Pain Assessment: No/denies pain Faces Pain Scale: No hurt     Hand Dominance Right   Extremity/Trunk Assessment Upper Extremity Assessment Upper Extremity Assessment: Generalized weakness     Communication Communication Communication: No difficulties  Cognition Arousal/Alertness: Awake/alert Behavior During Therapy: WFL for tasks assessed/performed Overall Cognitive Status: History of cognitive impairments - at baseline                                              Home Living Family/patient expects to be discharged to:: Skilled nursing facility                                 Additional Comments: per  dtr in room, pt is mobile with RW, can toilet herself and dress herself; but receives A for bathing      Prior Functioning/Environment Level of Independence: Independent with assistive device(s)                OT Problem List: Decreased strength;Decreased activity tolerance;Impaired balance (sitting and/or standing);Decreased knowledge of use of DME or AE;Decreased cognition;Decreased safety awareness      OT Treatment/Interventions: Self-care/ADL training;Balance training;DME and/or AE instruction;Patient/family education;Therapeutic activities    OT Goals(Current goals can be found in the care plan section) Acute Rehab OT Goals Patient Stated Goal: to lay back down OT Goal Formulation: With patient/family Time For Goal Achievement: 07/26/17 Potential to Achieve Goals: Fair  OT Frequency: Min 2X/week   Barriers to D/C: Decreased caregiver support             AM-PAC PT "6 Clicks" Daily Activity     Outcome Measure Help from another person eating meals?: A Little Help from another person taking care of personal grooming?: A Little Help from another person toileting, which includes using toliet, bedpan, or urinal?: Total Help from another person bathing (including washing, rinsing, drying)?: A Lot Help from another person to put on and taking off regular upper body clothing?: A Lot Help from another person to put on and taking off regular lower body clothing?: Total 6 Click Score: 12   End of Session Nurse Communication: (RN came to room and I let her know pt needed to be cleaned up)  Activity Tolerance: Patient limited by fatigue Patient left: in bed;with call bell/phone within reach;with bed alarm set;with family/visitor present  OT Visit Diagnosis: Unsteadiness on feet (R26.81);Other abnormalities of gait and mobility (R26.89);Muscle weakness (generalized) (M62.81);Other symptoms and signs involving cognitive function                Time: 9458-5929 OT Time  Calculation (min): 26 min Charges:  OT General Charges $OT Visit: 1 Visit OT Evaluation $OT Eval Moderate Complexity: 1 Mod OT Treatments $Self Care/Home Management : 8-22 mins Golden Circle, OTR/L [phone] 07/12/2017

## 2017-07-12 NOTE — Progress Notes (Signed)
Initial Nutrition Assessment  DOCUMENTATION CODES:   Not applicable  INTERVENTION:    Ensure Enlive po BID, each supplement provides 350 kcal and 20 grams of protein  Magic cup BID with meals, each supplement provides 290 kcal and 9 grams of protein  Liberalize diet to regular  NUTRITION DIAGNOSIS:   Inadequate oral intake related to poor appetite as evidenced by per patient/family report.  GOAL:   Patient will meet greater than or equal to 90% of their needs  MONITOR:   PO intake, Supplement acceptance, Weight trends, Labs  REASON FOR ASSESSMENT:   Malnutrition Screening Tool    ASSESSMENT:      Pt with PMH significant for HTN, PVD, depression, dementia, and adenocarcinoma of the ovary. Presents this admission with weakness and low grade fevers.   Pt unable to provide much history. States her appetite has remained poor since her last hospitalization in February. Unable to provide recent dietary recall stating she eats "normal foods." She ordered oatmeal and eggs for breakfast, waiting for it to be delivered upon visit. Pt amendable to supplementation this hospital stay. Records limited in weight history but shows a stable weight of 123 lb since 2/15. Nutrition-Focused physical exam completed. Pt shows to have moderate to severe depletions. Suspect this is likely related to advanced age. Do not suspect malnutrition at this time.   Medications reviewed.  Labs reviewed.   NUTRITION - FOCUSED PHYSICAL EXAM:    Most Recent Value  Orbital Region  Mild depletion  Upper Arm Region  Moderate depletion  Thoracic and Lumbar Region  Unable to assess  Buccal Region  Mild depletion  Temple Region  Moderate depletion  Clavicle Bone Region  Moderate depletion  Clavicle and Acromion Bone Region  Moderate depletion  Scapular Bone Region  Unable to assess  Dorsal Hand  Severe depletion  Patellar Region  Severe depletion  Anterior Thigh Region  Severe depletion  Posterior Calf  Region  Severe depletion  Edema (RD Assessment)  None  Hair  Reviewed  Eyes  Reviewed  Mouth  Reviewed  Skin  Reviewed  Nails  Reviewed     Diet Order:  Diet Heart Room service appropriate? Yes; Fluid consistency: Thin  EDUCATION NEEDS:   Not appropriate for education at this time  Skin:  Skin Assessment: Reviewed RN Assessment  Last BM:  PTA  Height:   Ht Readings from Last 1 Encounters:  07/11/17 5\' 7"  (1.702 m)    Weight:   Wt Readings from Last 1 Encounters:  07/11/17 123 lb 3.8 oz (55.9 kg)    Ideal Body Weight:  61.4 kg  BMI:  Body mass index is 19.3 kg/m.  Estimated Nutritional Needs:   Kcal:  1450-1650 kcal/day  Protein:  70-80 g/day  Fluid:  >1.4 L/day    Mariana Single RD, LDN Clinical Nutrition Pager # - [phone]

## 2017-07-12 NOTE — Progress Notes (Addendum)
CSW called to talk with patient daughter- Manuela Schwartz about patient PT recommendation for SNF rehab placement.  Patient daughter Manuela Schwartz reports she does not want to discuss patient discharge plan until she has been to the hospital to talk with patient attending physician and nurse to determine "why my mother cannot stand up."   -CSW spoke to patient daughter Hoyle Sauer she deferred CSW to talk with her pt. Daughter Susan(POA) about patient discharge planning.    Barriers to discharge -No permission to send out bed offers.  -Patient will need prior insurance authorization before discharging to SNF.    Clinical Social Work will continue to follow.   Kathrin Greathouse, Latanya Presser, MSW Clinical Social Worker  [phone] 07/12/2017  2:19 PM

## 2017-07-13 DIAGNOSIS — I10 Essential (primary) hypertension: Secondary | ICD-10-CM | POA: Diagnosis not present

## 2017-07-13 DIAGNOSIS — E860 Dehydration: Secondary | ICD-10-CM | POA: Diagnosis not present

## 2017-07-13 DIAGNOSIS — J111 Influenza due to unidentified influenza virus with other respiratory manifestations: Secondary | ICD-10-CM | POA: Diagnosis not present

## 2017-07-13 DIAGNOSIS — R531 Weakness: Secondary | ICD-10-CM | POA: Diagnosis not present

## 2017-07-13 LAB — CBC
HCT: 40 % (ref 36.0–46.0)
HEMOGLOBIN: 12.8 g/dL (ref 12.0–15.0)
MCH: 29.5 pg (ref 26.0–34.0)
MCHC: 32 g/dL (ref 30.0–36.0)
MCV: 92.2 fL (ref 78.0–100.0)
Platelets: 187 10*3/uL (ref 150–400)
RBC: 4.34 MIL/uL (ref 3.87–5.11)
RDW: 14.3 % (ref 11.5–15.5)
WBC: 4 10*3/uL (ref 4.0–10.5)

## 2017-07-13 LAB — COMPREHENSIVE METABOLIC PANEL
ALBUMIN: 2.8 g/dL — AB (ref 3.5–5.0)
ALK PHOS: 83 U/L (ref 38–126)
ALT: 10 U/L — AB (ref 14–54)
AST: 22 U/L (ref 15–41)
Anion gap: 7 (ref 5–15)
BUN: 19 mg/dL (ref 6–20)
CALCIUM: 7.9 mg/dL — AB (ref 8.9–10.3)
CO2: 26 mmol/L (ref 22–32)
CREATININE: 0.73 mg/dL (ref 0.44–1.00)
Chloride: 104 mmol/L (ref 101–111)
GFR calc Af Amer: >60 mL/min (ref >60)
GFR calc non Af Amer: >60 mL/min (ref >60)
GLUCOSE: 108 mg/dL — AB (ref 65–99)
Potassium: 3.5 mmol/L (ref 3.5–5.1)
SODIUM: 137 mmol/L (ref 135–145)
Total Bilirubin: 0.2 mg/dL — ABNORMAL LOW (ref 0.3–1.2)
Total Protein: 5.8 g/dL — ABNORMAL LOW (ref 6.5–8.1)

## 2017-07-13 LAB — URINE CULTURE: Culture: NO GROWTH

## 2017-07-13 MED ORDER — ACETAMINOPHEN 325 MG PO TABS
650.0000 mg | ORAL_TABLET | Freq: Four times a day (QID) | ORAL | Status: DC | PRN
  Administered 2017-07-13 – 2017-07-14 (×2): 650 mg via ORAL
  Filled 2017-07-13 (×2): qty 2

## 2017-07-13 NOTE — Progress Notes (Signed)
[facility] PROGRESS NOTE  [name] GQQ:[id number] DOB: June 14, 1918 DOA: 07/11/2017  PCP: [name], MD  Brief History/Interval Summary: 84 year old Caucasian female with a past medical history of hypertension, peripheral vascular disease, dementia, recent hospitalization for choledocholithiasis status post ERCP.  She was discharged to skilled nursing facility after that hospitalization and then went back to her assisted living facility.  Sent over here because of a few day history of generalized weakness lethargy low-grade fever and cough.  Patient evaluation was positive for influenza.  She was hospitalized for further management.  Reason for Visit: Influenza  Consultants: None  Procedures: None  Antibiotics: Tamiflu  Subjective/Interval History: Patient pleasantly confused.  Denies any shortness of breath or chest pain.  ROS: No nausea or vomiting  Objective:  Vital Signs  Vitals:   07/12/17 2107 07/13/17 0504 07/13/17 1031 07/13/17 1310  BP: (!) 144/57 (!) 142/50 (!) 140/56 (!) 121/35  Pulse: 94 72 74 81  Resp: 18 18  18   Temp: 99.3 F (37.4 C) 98.6 F (37 C)  97.6 F (36.4 C)  TempSrc: Oral Oral  Oral  SpO2: 95% 94%  97%  Weight:      Height:        Intake/Output Summary (Last 24 hours) at 07/13/2017 1351 Last data filed at 07/13/2017 1300 Gross per 24 hour  Intake 520 ml  Output -  Net 520 ml   Filed Weights   07/11/17 1629 07/11/17 2232  Weight: 55.8 kg (123 lb) 55.9 kg (123 lb 3.8 oz)    General appearance: Awake alert.  In no distress. Resp: Normal effort at rest.  Improved aeration bilaterally.  No wheezing.  No rhonchi. Cardio: S1-S2 normal regular.  No S3-S4.  No rubs murmurs or bruit GI: Abdomen is soft.  Nontender nondistended.  Bowel sounds are present.  No masses organomegaly Extremities: No edema Neurologic: Pleasantly confused.  Cranial nerves II through XII intact.  Motor strength equal bilateral upper and lower extremities.   Able to lift both her legs off the bed.  Lab Results:  Data Reviewed: I have personally reviewed following labs and imaging studies  CBC: Recent Labs  Lab 07/11/17 1647 07/13/17 0414  WBC 4.6 4.0  NEUTROABS 2.9  --   HGB 15.3* 12.8  HCT 46.6* 40.0  MCV 92.6 92.2  PLT 232 671    Basic Metabolic Panel: Recent Labs  Lab 07/11/17 1647 07/13/17 0414  NA 139 137  K 4.2 3.5  CL 98* 104  CO2 29 26  GLUCOSE 108* 108*  BUN 28* 19  CREATININE 0.93 0.73  CALCIUM 9.6 7.9*    GFR: Estimated Creatinine Clearance: 36.3 mL/min (by C-G formula based on SCr of 0.73 mg/dL).  Liver Function Tests: Recent Labs  Lab 07/11/17 1647 07/13/17 0414  AST 30 22  ALT 13* 10*  ALKPHOS 109 83  BILITOT 0.3 0.2*  PROT 7.6 5.8*  ALBUMIN 3.6 2.8*    Recent Labs  Lab 07/11/17 1647  LIPASE 25    Recent Results (from the past 240 hour(s))  Urine Culture     Status: None   Collection Time: 07/11/17  7:32 PM  Result Value Ref Range Status   Specimen Description   Final    URINE, CATHETERIZED Performed at [facility] [address], [place], [place] [address]    Special Requests   Final    NONE Performed at [facility], [place] [address], [place], [place] [address]  Culture   Final    NO GROWTH Performed at [facility], [place] [address], [place], [place] [address]    Report Status 07/13/2017 FINAL  Final  Surgical pcr screen     Status: None   Collection Time: 07/12/17 12:51 AM  Result Value Ref Range Status   MRSA, PCR NEGATIVE NEGATIVE Final   Staphylococcus aureus NEGATIVE NEGATIVE Final    Comment: (NOTE) The Xpert SA Assay (FDA approved for NASAL specimens in patients 33 years of age and older), is one component of a comprehensive surveillance program. It is not intended to diagnose infection nor to guide or monitor treatment. Performed at [facility], [place] [address], [place], [place] [address]        Radiology Studies: Dg Chest 2 View  Result Date: 07/11/2017 CLINICAL DATA:  Per order- fatigue Patient usually stays at [facility] on the Dementia memory care unit. Patient's family reports patient has become more tired and weak since Tuesday. PT HXL HTN, non smoker EXAM: CHEST - 2 VIEW COMPARISON:  06/12/2017 FINDINGS: Heart size is normal. There is mild perihilar peribronchial thickening. There is atherosclerotic calcification of the thoracic aorta. No there are no focal consolidations. Small bilateral pleural effusions are present. Multiple midthoracic wedge compression fractures are chronic. IMPRESSION: 1. Mild bronchitic changes. 2. Small bilateral pleural effusions. Electronically Signed   By: Nolon Nations M.D.   On: 07/11/2017 19:30     Medications:  Scheduled: . amLODipine  5 mg Oral Daily  . aspirin EC  81 mg Oral Daily  . docusate sodium  100 mg Oral Daily  . donepezil  10 mg Oral QHS  . enoxaparin (LOVENOX) injection  40 mg Subcutaneous Q24H  . feeding supplement (ENSURE ENLIVE)  237 mL Oral BID BM  . guaiFENesin  600 mg Oral BID  . metoprolol tartrate  25 mg Oral BID  . oseltamivir  30 mg Oral BID  . pentoxifylline  400 mg Oral Daily  . pravastatin  80 mg Oral Daily  . saccharomyces boulardii  250 mg Oral BID  . venlafaxine XR  75 mg Oral Q breakfast   Continuous:  NTI:[id number], ondansetron **OR** ondansetron (ZOFRAN) IV  Assessment/Plan:  Principal Problem:   Generalized weakness Active Problems:   HYPERTENSION, BENIGN   PERIPHERAL VASCULAR DISEASE    Generalized weakness Most likely secondary to influenza.  No other infectious etiology noted.  She is was also noted to be mildly dehydrated with elevated BUN.  Creatinine was normal.  She was gently hydrated.  Okay to stop IV fluids.  Therapy recommends short-term rehab at skilled nursing facility.  Social worker consulted.    Acute influenza Most likely reason for her presentation.  Continue with Tamiflu.   Respiratory status has improved.  Essential hypertension Continue home medications.  Recent choledocholithiasis and concern for liver abscess She had small lesions noted in the liver.  She was given prolonged antibiotic course.  She was seen by gastroenterology and underwent ERCP and sphincterotomy.  LFTs were significantly elevated during that admission.  Noted to be normal during this admission.  Her abdomen is benign.  History of peripheral vascular disease Stable.  Continue home medications.  History of dementia Stable.  Continue home medications.  DVT Prophylaxis: Lovenox    Code Status: DNR Family Communication: Discussed with patient's daughters yesterday. Disposition Plan: Medically stable.  Okay for discharge to skilled nursing facility when bed is available.    LOS: 0 days   Bonnielee Haff  [facility] Pager [phone] 07/13/2017, 1:51 PM  If 7PM-7AM, please contact night-coverage at [email or web address], password [facility]

## 2017-07-13 NOTE — NC FL2 (Signed)
[place] LEVEL OF CARE SCREENING TOOL     IDENTIFICATION  Patient Name: [name] Birthdate: 04-09-1919 Sex: female Admission Date (Current Location): 07/11/2017  [facility] and [place] Number:  Herbalist and Address:  [facility],  [place] [address] [place]      Provider Number: [id number]  Attending Physician Name and Address:  Bonnielee Haff, MD  Relative Name and Phone Number:       Current Level of Care: Hospital Recommended Level of Care: [place] Prior Approval Number:    Date Approved/Denied:   PASRR Number: [id number]  Discharge Plan: SNF    Current Diagnoses: Patient Active Problem List   Diagnosis Date Noted  . Generalized weakness 07/11/2017  . Cholangitis   . Choledocholithiasis   . Fever 06/12/2017  . ADENOCARCINOMA, OVARY 07/14/2008  . ANEMIA 07/14/2008  . DEPRESSION 07/14/2008  . PERIPHERAL VASCULAR DISEASE 07/14/2008  . GERD 07/14/2008  . MELENA 07/14/2008  . OSTEOARTHRITIS 07/14/2008  . OSTEOPOROSIS 07/14/2008  . PANCREATITIS, HX OF 07/14/2008  . ESOPHAGITIS, HX OF 07/14/2008  . HYPERLIPIDEMIA TYPE IIB / III 04/16/2008  . HYPERTENSION, BENIGN 04/16/2008  . LIMB PAIN 04/16/2008  . CHEST PAIN-UNSPECIFIED 04/16/2008    Orientation RESPIRATION BLADDER Height & Weight     Self, Place  Normal Continent Weight: 123 lb 3.8 oz (55.9 kg) Height:  5\' 7"  (170.2 cm)  BEHAVIORAL SYMPTOMS/MOOD NEUROLOGICAL BOWEL NUTRITION STATUS      Continent Diet(Regular)  AMBULATORY STATUS COMMUNICATION OF NEEDS Skin   Extensive Assist Verbally Normal                       Personal Care Assistance Level of Assistance  Bathing, Feeding, Dressing Bathing Assistance: Limited assistance Feeding assistance: Independent Dressing Assistance: Limited assistance     Functional Limitations Info  Sight, Hearing, Speech Sight Info: Adequate Hearing Info: Adequate Speech Info: Adequate     SPECIAL CARE FACTORS FREQUENCY  PT (By licensed PT), OT (By licensed OT)     PT Frequency: 5x/week OT Frequency: 5x/week            Contractures Contractures Info: Not present    Additional Factors Info  Code Status, Allergies Code Status Info: DNR Allergies Info: Allergies: Hydrocodone           Current Medications (07/13/2017):  This is the current hospital active medication list Current Facility-Administered Medications  Medication Dose Route Frequency Provider Last Rate Last Dose  . albuterol (PROVENTIL) (2.5 MG/3ML) 0.083% nebulizer solution 2.5 mg  2.5 mg Nebulization Q8H PRN Emokpae, Ejiroghene E, MD      . amLODipine (NORVASC) tablet 5 mg  5 mg Oral Daily Emokpae, Ejiroghene E, MD   5 mg at 07/13/17 1032  . aspirin EC tablet 81 mg  81 mg Oral Daily Emokpae, Ejiroghene E, MD   81 mg at 07/13/17 1034  . docusate sodium (COLACE) capsule 100 mg  100 mg Oral Daily Emokpae, Ejiroghene E, MD   100 mg at 07/13/17 1031  . donepezil (ARICEPT) tablet 10 mg  10 mg Oral QHS Emokpae, Ejiroghene E, MD   10 mg at 07/12/17 2200  . enoxaparin (LOVENOX) injection 40 mg  40 mg Subcutaneous Q24H Emokpae, Ejiroghene E, MD   40 mg at 07/13/17 1031  . feeding supplement (ENSURE ENLIVE) (ENSURE ENLIVE) liquid 237 mL  237 mL Oral BID BM Emokpae, Courage, MD   237 mL at 07/13/17 1034  . guaiFENesin (  MUCINEX) 12 hr tablet 600 mg  600 mg Oral BID Bonnielee Haff, MD   600 mg at 07/13/17 1034  . metoprolol tartrate (LOPRESSOR) tablet 25 mg  25 mg Oral BID Emokpae, Ejiroghene E, MD   25 mg at 07/13/17 1031  . ondansetron (ZOFRAN) tablet 4 mg  4 mg Oral Q6H PRN Emokpae, Ejiroghene E, MD       Or  . ondansetron (ZOFRAN) injection 4 mg  4 mg Intravenous Q6H PRN Emokpae, Ejiroghene E, MD      . oseltamivir (TAMIFLU) capsule 30 mg  30 mg Oral BID Emokpae, Ejiroghene E, MD   30 mg at 07/13/17 1033  . pentoxifylline (TRENTAL) CR tablet 400 mg  400 mg Oral Daily Emokpae, Ejiroghene E, MD   400 mg at  07/13/17 1033  . pravastatin (PRAVACHOL) tablet 80 mg  80 mg Oral Daily Emokpae, Ejiroghene E, MD   80 mg at 07/13/17 1031  . saccharomyces boulardii (FLORASTOR) capsule 250 mg  250 mg Oral BID Emokpae, Ejiroghene E, MD   250 mg at 07/13/17 1031  . venlafaxine XR (EFFEXOR-XR) 24 hr capsule 75 mg  75 mg Oral Q breakfast Emokpae, Ejiroghene E, MD   75 mg at 07/13/17 0827     Discharge Medications: Please see discharge summary for a list of discharge medications.  Relevant Imaging Results:  Relevant Lab Results:   Additional Information SS [id number]  Servando Snare, LCSW

## 2017-07-14 DIAGNOSIS — I10 Essential (primary) hypertension: Secondary | ICD-10-CM | POA: Diagnosis not present

## 2017-07-14 DIAGNOSIS — R531 Weakness: Secondary | ICD-10-CM | POA: Diagnosis not present

## 2017-07-14 DIAGNOSIS — J111 Influenza due to unidentified influenza virus with other respiratory manifestations: Secondary | ICD-10-CM | POA: Diagnosis not present

## 2017-07-14 DIAGNOSIS — E860 Dehydration: Secondary | ICD-10-CM | POA: Diagnosis not present

## 2017-07-14 NOTE — Progress Notes (Signed)
[facility] PROGRESS NOTE  [name] WUJ:[id number] DOB: 30-Jun-1918 DOA: 07/11/2017  PCP: [name], MD  Brief History/Interval Summary: 84 year old Caucasian female with a past medical history of hypertension, peripheral vascular disease, dementia, recent hospitalization for choledocholithiasis status post ERCP.  She was discharged to skilled nursing facility after that hospitalization and then went back to her assisted living facility.  Sent over here because of a few day history of generalized weakness lethargy low-grade fever and cough.  Patient evaluation was positive for influenza.  She was hospitalized for further management.  Patient continues to improve.  Reason for Visit: Influenza  Consultants: None  Procedures: None  Antibiotics: Tamiflu  Subjective/Interval History: Patient remains pleasantly confused.  Denies any shortness of breath or cough today.  Feels better.  ROS: No nausea or vomiting  Objective:  Vital Signs  Vitals:   07/13/17 1310 07/13/17 1807 07/13/17 2015 07/14/17 0416  BP: (!) 121/35 130/72 135/67 133/62  Pulse: 81 98 88 65  Resp: 18  16 14   Temp: 97.6 F (36.4 C)  98.5 F (36.9 C) 98.4 F (36.9 C)  TempSrc: Oral  Oral Oral  SpO2: 97%  98% 99%  Weight:      Height:        Intake/Output Summary (Last 24 hours) at 07/14/2017 0918 Last data filed at 07/13/2017 1809 Gross per 24 hour  Intake 230 ml  Output -  Net 230 ml   Filed Weights   07/11/17 1629 07/11/17 2232  Weight: 55.8 kg (123 lb) 55.9 kg (123 lb 3.8 oz)    General appearance: Awake alert.  Distracted.  In no distress. Resp: Normal effort.  Improved aeration bilaterally.  No wheezing. Cardio: S1-S2 is normal regular. GI: Abdomen is soft.  Nontender nondistended. Extremities: No edema Neurologic: No obvious focal neurological deficits.  Lab Results:  Data Reviewed: I have personally reviewed following labs and imaging studies  CBC: Recent Labs  Lab  07/11/17 1647 07/13/17 0414  WBC 4.6 4.0  NEUTROABS 2.9  --   HGB 15.3* 12.8  HCT 46.6* 40.0  MCV 92.6 92.2  PLT 232 956    Basic Metabolic Panel: Recent Labs  Lab 07/11/17 1647 07/13/17 0414  NA 139 137  K 4.2 3.5  CL 98* 104  CO2 29 26  GLUCOSE 108* 108*  BUN 28* 19  CREATININE 0.93 0.73  CALCIUM 9.6 7.9*    GFR: Estimated Creatinine Clearance: 36.3 mL/min (by C-G formula based on SCr of 0.73 mg/dL).  Liver Function Tests: Recent Labs  Lab 07/11/17 1647 07/13/17 0414  AST 30 22  ALT 13* 10*  ALKPHOS 109 83  BILITOT 0.3 0.2*  PROT 7.6 5.8*  ALBUMIN 3.6 2.8*    Recent Labs  Lab 07/11/17 1647  LIPASE 25    Recent Results (from the past 240 hour(s))  Urine Culture     Status: None   Collection Time: 07/11/17  7:32 PM  Result Value Ref Range Status   Specimen Description   Final    URINE, CATHETERIZED Performed at [facility] [address], [place], [place] [address]    Special Requests   Final    NONE Performed at [facility], [place] [address], [place], [place] [address]    Culture   Final    NO GROWTH Performed at [facility], [place] [address], [place], [place] [address]    Report Status 07/13/2017 FINAL  Final  Surgical pcr screen  Status: None   Collection Time: 07/12/17 12:51 AM  Result Value Ref Range Status   MRSA, PCR NEGATIVE NEGATIVE Final   Staphylococcus aureus NEGATIVE NEGATIVE Final    Comment: (NOTE) The Xpert SA Assay (FDA approved for NASAL specimens in patients 25 years of age and older), is one component of a comprehensive surveillance program. It is not intended to diagnose infection nor to guide or monitor treatment. Performed at [facility], [place] [address], [place], [place] [address]       Radiology Studies: No results found.   Medications:  Scheduled: . amLODipine  5 mg Oral Daily  . aspirin EC  81 mg Oral Daily  . docusate sodium  100 mg  Oral Daily  . donepezil  10 mg Oral QHS  . enoxaparin (LOVENOX) injection  40 mg Subcutaneous Q24H  . feeding supplement (ENSURE ENLIVE)  237 mL Oral BID BM  . guaiFENesin  600 mg Oral BID  . metoprolol tartrate  25 mg Oral BID  . oseltamivir  30 mg Oral BID  . pentoxifylline  400 mg Oral Daily  . pravastatin  80 mg Oral Daily  . saccharomyces boulardii  250 mg Oral BID  . venlafaxine XR  75 mg Oral Q breakfast   Continuous:  PZW:CHENIDPOEUMPN, albuterol, ondansetron **OR** ondansetron (ZOFRAN) IV  Assessment/Plan:  Principal Problem:   Generalized weakness Active Problems:   HYPERTENSION, BENIGN   PERIPHERAL VASCULAR DISEASE    Generalized weakness Most likely secondary to influenza.  No other infectious etiology noted.  She was also noted to be mildly dehydrated with elevated BUN.  Creatinine was normal.  She was gently hydrated.  Therapy recommends short-term rehab at skilled nursing facility.  Social worker consulted.    Acute influenza Most likely reason for her presentation.  Tamiflu for 5 days.  Respiratory status has improved.    Essential hypertension Continue home medications.  Pressure is stable.  Recent choledocholithiasis and concern for liver abscess She was given prolonged antibiotic course.  She was seen by gastroenterology and underwent ERCP and sphincterotomy.  LFTs were significantly elevated during that admission.  Noted to be normal during this admission.  Her abdomen is benign.  History of peripheral vascular disease Stable.  Continue home medications.  History of dementia Stable.  Continue home medications.  DVT Prophylaxis: Lovenox    Code Status: DNR Family Communication: No family at bedside. Disposition Plan: Medically stable.  Okay for discharge to skilled nursing facility when bed is available.    LOS: 0 days   [facility] Pager [phone] 07/14/2017, 9:18 AM  If 7PM-7AM, please contact night-coverage at  [email or web address], password [facility]

## 2017-07-14 NOTE — Clinical Social Work Note (Signed)
Clinical Social Work Assessment  Patient Details  Name: [name] MRN: [id number] Date of Birth: [birth year]  Date of referral:  07/14/17               Reason for consult:  Facility Placement(pt also admitted from facility)                Permission sought to share information with:  Family Supports, Customer service manager Permission granted to share information::  Yes, Verbal Permission Granted  Name::     daughter [name]  Agency::  [facility], [facility]  Relationship::     Contact Information:     Housing/Transportation Living arrangements for the past 2 months:  [place] of Information:  Adult Children Patient Interpreter Needed:  None Criminal Activity/Legal Involvement Pertinent to Current Situation/Hospitalization:  No - Comment as needed Significant Relationships:  Adult Children, Warehouse manager Lives with:  Facility Resident Do you feel safe going back to the place where you live?  Yes Need for family participation in patient care:  Yes (Comment)(pt with dementia, relies on family to assist)  Care giving concerns:  Pt admitted from [facility]. At baseline uses walker to ambulate and sometimes needs assistance transferring and sitting up in bed. Is independent in ability to complete ADLs but needs prompting at times. Has dementia and baseline cognitive status is that she knows generally where she is (knows she "is in the hospital, or is in [place], but not the name of the place") and is able to recognize family and caregivers. Able to follow commands without difficulty, does not wander or exit-seek.  Currently needing max assist with ambulation and ADLs.  Pt was recently at [facility] for rehab 06/21/17- 07/05/17 following a hospitalization at [facility] for choledocholithiasis status post ERCP. Readmitted to [facility] 07/11/17- positive for influenza, on tamiflu.    Social Worker assessment / plan:   CSW consulted to assist with SNF placement. Pt and family known to CSW from recent admission. Pt is resident of [facility] memory care and recently was at [facility] for rehab post prior DC from [facility] 06/21/17. Pt sleeping at time of assessment but CSW discussed with daughter via phone- she states family would prefer for pt to return to [facility] for rehab if possible. [facility] Medicare authorization needed. CSW made referrals to [facility] and other area SNFs. Will follow up with bed offers. Facility may begin insurance authorization request once bed available & selected.  Employment status:  Retired Forensic scientist:  Information systems manager, Medicaid In [place] PT Recommendations:  [place] / Referral to community resources:  [place]  Patient/Family's Response to care:  Daughter appreciative of care  Patient/Family's Understanding of and Emotional Response to Diagnosis, Current Treatment, and Prognosis:  Unable to gauge pt's understanding- sleeping. Has dementia and in the past showed basic understanding of her status. Daughter demonstrates good understanding and is good historian of pt's care needs. States, "She did really well with rehab at [facility], she was up and walking around again in a couple of weeks there. We want her to get back to that level if she can."  Emotional Assessment Appearance:  Appears stated age Attitude/Demeanor/Rapport:  (sleeping) Affect (typically observed):  Calm Orientation:  (UTA- pt sleeping, oriented to person/place per charting) Alcohol / Substance use:    Psych involvement (Current and /or in the community):  No (Comment)  Discharge Needs  Concerns  to be addressed:  Care Coordination, Discharge Planning Concerns Readmission within the last 30 days:  Yes Current discharge risk:  Dependent with Mobility Barriers to Discharge:  [place], LCSW 07/14/2017,  10:34 AM [phone]

## 2017-07-15 DIAGNOSIS — R509 Fever, unspecified: Secondary | ICD-10-CM | POA: Diagnosis not present

## 2017-07-15 DIAGNOSIS — J1108 Influenza due to unidentified influenza virus with specified pneumonia: Secondary | ICD-10-CM | POA: Diagnosis not present

## 2017-07-15 DIAGNOSIS — E860 Dehydration: Secondary | ICD-10-CM | POA: Diagnosis not present

## 2017-07-15 DIAGNOSIS — J111 Influenza due to unidentified influenza virus with other respiratory manifestations: Secondary | ICD-10-CM | POA: Diagnosis not present

## 2017-07-15 MED ORDER — OSELTAMIVIR PHOSPHATE 30 MG PO CAPS: 30.0000 mg | ORAL_CAPSULE | Freq: Two times a day (BID) | ORAL | 0 refills | Status: AC

## 2017-07-15 MED ORDER — GUAIFENESIN ER 600 MG PO TB12: 600.0000 mg | ORAL_TABLET | Freq: Two times a day (BID) | ORAL | Status: AC

## 2017-07-15 NOTE — Progress Notes (Signed)
Pt d/c via [facility]

## 2017-07-15 NOTE — Progress Notes (Signed)
Discussed bed offers with daughter- [facility] selected. CSW spoke with admissions- to begin insurance authorization request. CSW discussed insurance auth process with daughter and daughter states she will discuss with sister if private pay will be option should insurance auth be denied. Will udpate pt's ALF [facility] as well.  Sharren Bridge, MSW, LCSW Clinical Social Work 07/15/2017 [phone]

## 2017-07-15 NOTE — Discharge Summary (Signed)
[facility]  Physician Discharge Summary   Patient ID: [name] MRN: [id number] DOB/AGE: Feb 26, 1919 84 y.o.  Admit date: 07/11/2017 Discharge date: 07/15/2017  PCP: [name], MD  DISCHARGE DIAGNOSES:  Principal Problem:   Generalized weakness Active Problems:   HYPERTENSION, BENIGN   PERIPHERAL VASCULAR DISEASE   RECOMMENDATIONS FOR OUTPATIENT FOLLOW UP: 1. CBC and basic metabolic panel in 1 week  DISCHARGE CONDITION: fair  Diet recommendation: Heart healthy  Filed Weights   07/11/17 1629 07/11/17 2232  Weight: 55.8 kg (123 lb) 55.9 kg (123 lb 3.8 oz)    INITIAL HISTORY: 84 year old Caucasian female with a past medical history of hypertension, peripheral vascular disease, dementia, recent hospitalization for choledocholithiasis status post ERCP.  She was discharged to skilled nursing facility after that hospitalization and then went back to her assisted living facility.  Sent over here because of a few day history of generalized weakness lethargy low-grade fever and cough.  Patient evaluation was positive for influenza.  She was hospitalized for further management.  Patient continues to improve.   HOSPITAL COURSE:   Generalized weakness Most likely secondary to influenza.  No other infectious etiology noted.  She was also noted to be mildly dehydrated with elevated BUN.  Creatinine was normal.  She was gently hydrated.    Physical therapy recommends short-term rehab at skilled nursing facility.  Social worker consulted.    Acute influenza Most likely reason for her presentation.  Tamiflu for 5 days.  Initiated on 3/8. Respiratory status has improved.    Essential hypertension Stable.  Continue home medications.  Recent choledocholithiasis and concern for liver abscess This was during her previous hospitalization in February.  She was given prolonged antibiotic course.  She was seen by gastroenterology and underwent ERCP and sphincterotomy.  LFTs  were significantly elevated during that admission.  Noted to be normal during this admission.  Her abdomen is benign.  History of peripheral vascular disease Stable.  Continue home medications.  History of dementia Stable.  Continue home medications.  Overall stable.  Okay for discharge to skilled nursing facility.   PERTINENT LABS:  The results of significant diagnostics from this hospitalization (including imaging, microbiology, ancillary and laboratory) are listed below for reference.    Microbiology: Recent Results (from the past 240 hour(s))  Urine Culture     Status: None   Collection Time: 07/11/17  7:32 PM  Result Value Ref Range Status   Specimen Description   Final    URINE, CATHETERIZED Performed at [facility] [address] [place], [place] [address]    Special Requests   Final    NONE Performed at [facility], [place] [address] [place], [place] [address]    Culture   Final    NO GROWTH Performed at [facility], [place] [address] [place], [place] [address]    Report Status 07/13/2017 FINAL  Final  Surgical pcr screen     Status: None   Collection Time: 07/12/17 12:51 AM  Result Value Ref Range Status   MRSA, PCR NEGATIVE NEGATIVE Final   Staphylococcus aureus NEGATIVE NEGATIVE Final    Comment: (NOTE) The Xpert SA Assay (FDA approved for NASAL specimens in patients 38 years of age and older), is one component of a comprehensive surveillance program. It is not intended to diagnose infection nor to guide or monitor treatment. Performed at [facility], [place] [address], [place],  [address]      Labs: Basic  Metabolic Panel: Recent Labs  Lab 07/11/17 1647 07/13/17 0414  NA 139 137  K 4.2 3.5  CL 98* 104  CO2 29 26  GLUCOSE 108* 108*  BUN 28* 19  CREATININE 0.93 0.73  CALCIUM 9.6 7.9*   Liver Function Tests: Recent Labs  Lab 07/11/17 1647 07/13/17 0414  AST 30 22  ALT  13* 10*  ALKPHOS 109 83  BILITOT 0.3 0.2*  PROT 7.6 5.8*  ALBUMIN 3.6 2.8*   Recent Labs  Lab 07/11/17 1647  LIPASE 25   CBC: Recent Labs  Lab 07/11/17 1647 07/13/17 0414  WBC 4.6 4.0  NEUTROABS 2.9  --   HGB 15.3* 12.8  HCT 46.6* 40.0  MCV 92.6 92.2  PLT 232 187    IMAGING STUDIES Dg Chest 2 View  Result Date: 07/11/2017 CLINICAL DATA:  Per order- fatigue Patient usually stays at [facility] on the Dementia memory care unit. Patient's family reports patient has become more tired and weak since Tuesday. PT HXL HTN, non smoker EXAM: CHEST - 2 VIEW COMPARISON:  06/12/2017 FINDINGS: Heart size is normal. There is mild perihilar peribronchial thickening. There is atherosclerotic calcification of the thoracic aorta. No there are no focal consolidations. Small bilateral pleural effusions are present. Multiple midthoracic wedge compression fractures are chronic. IMPRESSION: 1. Mild bronchitic changes. 2. Small bilateral pleural effusions. Electronically Signed   By: Nolon Nations M.D.   On: 07/11/2017 19:30    DISCHARGE EXAMINATION: Vitals:   07/14/17 0416 07/14/17 1402 07/14/17 2003 07/15/17 0510  BP: 133/62 (!) 161/41 133/64 127/62  Pulse: 65 (!) 58 69 72  Resp: 14 16 14 12   Temp: 98.4 F (36.9 C) 98.8 F (37.1 C) 98.9 F (37.2 C) 98.7 F (37.1 C)  TempSrc: Oral Oral Oral Oral  SpO2: 99% 97% 94% 95%  Weight:      Height:       General appearance: alert, cooperative, appears stated age and no distress Resp: clear to auscultation bilaterally Cardio: regular rate and rhythm, S1, S2 normal, no murmur, click, rub or gallop GI: soft, non-tender; bowel sounds normal; no masses,  no organomegaly Extremities: extremities normal, atraumatic, no cyanosis or edema Neurologic: Mildly distracted.  No obvious focal neurological deficits.  DISPOSITION: Skilled nursing facility  Discharge Instructions    Call MD for:  difficulty breathing, headache or visual disturbances   Complete  by:  As directed    Call MD for:  extreme fatigue   Complete by:  As directed    Call MD for:  persistant dizziness or light-headedness   Complete by:  As directed    Call MD for:  persistant nausea and vomiting   Complete by:  As directed    Call MD for:  severe uncontrolled pain   Complete by:  As directed    Call MD for:  temperature >100.4   Complete by:  As directed    Discharge instructions   Complete by:  As directed    See instructions on the discharge summary.  You were cared for by a hospitalist during your hospital stay. If you have any questions about your discharge medications or the care you received while you were in the hospital after you are discharged, you can call the unit and asked to speak with the hospitalist on call if the hospitalist that took care of you is not available. Once you are discharged, your primary care physician will handle any further medical issues. Please note that NO REFILLS for any discharge  medications will be authorized once you are discharged, as it is imperative that you return to your primary care physician (or establish a relationship with a primary care physician if you do not have one) for your aftercare needs so that they can reassess your need for medications and monitor your lab values. If you do not have a primary care physician, you can call [phone] for a physician referral.   Increase activity slowly   Complete by:  As directed         Allergies as of 07/15/2017      Reactions   Hydrocodone       Medication List    STOP taking these medications   HYDROcodone-acetaminophen 5-325 MG tablet Commonly known as:  NORCO/VICODIN     TAKE these medications   alendronate 70 MG tablet Commonly known as:  FOSAMAX Take 70 mg by mouth once a week.   amLODipine 5 MG tablet Commonly known as:  NORVASC Take 1 tablet (5 mg total) by mouth daily.   aspirin 81 MG EC tablet Take 1 tablet (81 mg total) by mouth daily.   docusate sodium  100 MG capsule Commonly known as:  COLACE Take 1 capsule (100 mg total) by mouth daily.   donepezil 10 MG tablet Commonly known as:  ARICEPT Take 10 mg by mouth at bedtime.   ergocalciferol 50000 units capsule Commonly known as:  VITAMIN D2 Take 50,000 Units by mouth every 14 (fourteen) days.   feeding supplement (PRO-STAT SUGAR FREE 64) Liqd Take 30 mLs by mouth 2 (two) times daily.   feeding supplement Liqd Take 1 Container by mouth daily.   guaiFENesin 600 MG 12 hr tablet Commonly known as:  MUCINEX Take 1 tablet (600 mg total) by mouth 2 (two) times daily.   metoprolol tartrate 25 MG tablet Commonly known as:  LOPRESSOR Take 1 tablet (25 mg total) by mouth 2 (two) times daily.   oseltamivir 30 MG capsule Commonly known as:  TAMIFLU Take 1 capsule (30 mg total) by mouth 2 (two) times daily for 2 days. Till 07/16/17   oyster calcium 500 MG Tabs tablet Take 500 mg of elemental calcium by mouth 2 (two) times daily.   pentoxifylline 400 MG CR tablet Commonly known as:  TRENTAL Take 400 mg by mouth daily.   pravastatin 80 MG tablet Commonly known as:  PRAVACHOL Take 80 mg by mouth daily.   saccharomyces boulardii 250 MG capsule Commonly known as:  FLORASTOR Take 1 capsule (250 mg total) by mouth 2 (two) times daily.   SENNOSIDES PO Take 2 tablets by mouth daily.   venlafaxine XR 75 MG 24 hr capsule Commonly known as:  EFFEXOR-XR Take 75 mg by mouth daily with breakfast.         Contact information for follow-up providers    [name], MD. Schedule an appointment as soon as possible for a visit in 2 week(s).   Specialty:  Internal Medicine Contact information: [place], [place] [address] [phone]            Contact information for after-discharge care    [facility] Follow up.   Service:  Skilled Nursing Contact information: [place]  [place] [phone]                  TOTAL DISCHARGE TIME: 35 minutes  Bonnielee Haff  [facility] Pager [phone]  07/15/2017, 10:33 AM

## 2017-07-15 NOTE — Progress Notes (Signed)
PT Cancellation Note  Patient Details Name: [name] MRN: [id number] DOB: 1919/01/09   Cancelled Treatment:    Reason Eval/Treat Not Completed: Fatigue/lethargy limiting ability to participate, patient stated"Oh, no, not know.. I am going to sleep> " Patient declined   Claretha Cooper 07/15/2017, 11:58 AM Tresa Endo PT [phone]

## 2017-07-15 NOTE — Progress Notes (Signed)
CSW has been following to placement needs today. See past notes. [facility] unable to obtain insurance authorization. Investigated bed options with AD approving 5 -day LOG (if facilities unable to accept pt under her Medicaid). Identified [facility]- started authorization and agreed to accept pt under Medicaid in meantime if pt/family agree to staying 30 days at SNF in order to have Medicaid cover stay if [facility] was to deny authorization. Discussed this at length with pt's daughter. They felt uncomfortable with this as they feel pt "will not need 30 days and would be in danger of losing her [facility] bed if [employer] denies her SNF request."   16:00- [employer] medicare not yet responded to request and family unable to decide to agree to SNF bed offered. CSW and family spoke with pt's ALF (memory care) Geophysicist/field seismologist (spoke with Wannetta Sender and Gerald Stabs). Explained situation above- that SNF was recommended however placement not secured.  Discussed pt's presentation and care needs at length with facility.  Daughter states they strongly prefer for pt to return to [facility] at this point. Nanine Means maintains that "if SNF recommended this is their encouragement as well," however ALF states that pt can return directly and needs can be met with Home Health PT/OT/RN ordered (active with home health [facility]). Provided pt's DC summary and FL2 for facility to review in order to ensure care needs can be met- Gerald Stabs advises paperwork received and facility prepared for pt's return.  CSW arranged [facility] transportation to [facility] memory care. Report [phone]  Sharren Bridge, MSW, LCSW Clinical Social Work 07/15/2017 [phone]

## 2017-07-15 NOTE — NC FL2 (Addendum)
[place] LEVEL OF CARE SCREENING TOOL     IDENTIFICATION  Patient Name: [name] Birthdate: 18-Apr-1919 Sex: female Admission Date (Current Location): 07/11/2017  [facility] and [place] Number:  Herbalist and Address:  [facility],  [place] [address], [place]      Provider Number: [id number]  Attending Physician Name and Address:  Bonnielee Haff, MD  Relative Name and Phone Number:       Current Level of Care: Hospital Recommended Level of Care: [place], secured unit Prior Approval Number:    Date Approved/Denied:   PASRR Number: [id number]  Discharge Plan: Home (ALF memory care secured unit)    Current Diagnoses: Patient Active Problem List   Diagnosis Date Noted  . Generalized weakness 07/11/2017  . Cholangitis   . Choledocholithiasis   . Fever 06/12/2017  . ADENOCARCINOMA, OVARY 07/14/2008  . ANEMIA 07/14/2008  . DEPRESSION 07/14/2008  . PERIPHERAL VASCULAR DISEASE 07/14/2008  . GERD 07/14/2008  . MELENA 07/14/2008  . OSTEOARTHRITIS 07/14/2008  . OSTEOPOROSIS 07/14/2008  . PANCREATITIS, HX OF 07/14/2008  . ESOPHAGITIS, HX OF 07/14/2008  . HYPERLIPIDEMIA TYPE IIB / III 04/16/2008  . HYPERTENSION, BENIGN 04/16/2008  . LIMB PAIN 04/16/2008  . CHEST PAIN-UNSPECIFIED 04/16/2008    Orientation RESPIRATION BLADDER Height & Weight     Self, Place  Normal Continent Weight: 123 lb 3.8 oz (55.9 kg) Height:  5\' 7"  (170.2 cm)  BEHAVIORAL SYMPTOMS/MOOD NEUROLOGICAL BOWEL NUTRITION STATUS      Continent Diet(Regular)  AMBULATORY STATUS COMMUNICATION OF NEEDS Skin   Extensive Assist Verbally Normal                       Personal Care Assistance Level of Assistance  Bathing, Feeding, Dressing Bathing Assistance: Limited assistance Feeding assistance: Independent Dressing Assistance: Limited assistance     Functional Limitations Info  Sight, Hearing, Speech Sight Info: Adequate Hearing  Info: Adequate Speech Info: Adequate    SPECIAL CARE FACTORS FREQUENCY  PT (By licensed PT), OT (By licensed OT)     PT Frequency: 2x/week OT Frequency: 2x/week            Contractures Contractures Info: Not present    Additional Factors Info  Code Status, Allergies Code Status Info: DNR Allergies Info: Allergies: Hydrocodone           Current Medications (07/15/2017):  This is the current hospital active medication list Current Facility-Administered Medications  Medication Dose Route Frequency Provider Last Rate Last Dose  . acetaminophen (TYLENOL) tablet 650 mg  650 mg Oral Q6H PRN Lovey Newcomer T, NP   650 mg at 07/14/17 2128  . albuterol (PROVENTIL) (2.5 MG/3ML) 0.083% nebulizer solution 2.5 mg  2.5 mg Nebulization Q8H PRN Emokpae, Ejiroghene E, MD      . amLODipine (NORVASC) tablet 5 mg  5 mg Oral Daily Emokpae, Ejiroghene E, MD   5 mg at 07/14/17 1312  . aspirin EC tablet 81 mg  81 mg Oral Daily Emokpae, Ejiroghene E, MD   81 mg at 07/14/17 1312  . docusate sodium (COLACE) capsule 100 mg  100 mg Oral Daily Emokpae, Ejiroghene E, MD   100 mg at 07/14/17 1312  . donepezil (ARICEPT) tablet 10 mg  10 mg Oral QHS Emokpae, Ejiroghene E, MD   10 mg at 07/14/17 2121  . enoxaparin (LOVENOX) injection 40 mg  40 mg Subcutaneous Q24H Emokpae, Ejiroghene E, MD   40 mg at 07/14/17  1312  . feeding supplement (ENSURE ENLIVE) (ENSURE ENLIVE) liquid 237 mL  237 mL Oral BID BM Emokpae, Courage, MD   237 mL at 07/13/17 1504  . guaiFENesin (MUCINEX) 12 hr tablet 600 mg  600 mg Oral BID Bonnielee Haff, MD   600 mg at 07/14/17 2123  . metoprolol tartrate (LOPRESSOR) tablet 25 mg  25 mg Oral BID Emokpae, Ejiroghene E, MD   25 mg at 07/14/17 2121  . ondansetron (ZOFRAN) tablet 4 mg  4 mg Oral Q6H PRN Emokpae, Ejiroghene E, MD       Or  . ondansetron (ZOFRAN) injection 4 mg  4 mg Intravenous Q6H PRN Emokpae, Ejiroghene E, MD      . oseltamivir (TAMIFLU) capsule 30 mg  30 mg Oral BID Emokpae,  Ejiroghene E, MD   30 mg at 07/14/17 2121  . pentoxifylline (TRENTAL) CR tablet 400 mg  400 mg Oral Daily Emokpae, Ejiroghene E, MD   400 mg at 07/14/17 1312  . pravastatin (PRAVACHOL) tablet 80 mg  80 mg Oral Daily Emokpae, Ejiroghene E, MD   80 mg at 07/14/17 1311  . saccharomyces boulardii (FLORASTOR) capsule 250 mg  250 mg Oral BID Emokpae, Ejiroghene E, MD   250 mg at 07/14/17 2121  . venlafaxine XR (EFFEXOR-XR) 24 hr capsule 75 mg  75 mg Oral Q breakfast Emokpae, Ejiroghene E, MD   75 mg at 07/14/17 1317     Discharge Medications: TAKE these medications   alendronate 70 MG tablet Commonly known as:  FOSAMAX Take 70 mg by mouth once a week.   amLODipine 5 MG tablet Commonly known as:  NORVASC Take 1 tablet (5 mg total) by mouth daily.   aspirin 81 MG EC tablet Take 1 tablet (81 mg total) by mouth daily.   docusate sodium 100 MG capsule Commonly known as:  COLACE Take 1 capsule (100 mg total) by mouth daily.   donepezil 10 MG tablet Commonly known as:  ARICEPT Take 10 mg by mouth at bedtime.   ergocalciferol 50000 units capsule Commonly known as:  VITAMIN D2 Take 50,000 Units by mouth every 14 (fourteen) days.   feeding supplement (PRO-STAT SUGAR FREE 64) Liqd Take 30 mLs by mouth 2 (two) times daily.   feeding supplement Liqd Take 1 Container by mouth daily.   guaiFENesin 600 MG 12 hr tablet Commonly known as:  MUCINEX Take 1 tablet (600 mg total) by mouth 2 (two) times daily.   metoprolol tartrate 25 MG tablet Commonly known as:  LOPRESSOR Take 1 tablet (25 mg total) by mouth 2 (two) times daily.   oseltamivir 30 MG capsule Commonly known as:  TAMIFLU Take 1 capsule (30 mg total) by mouth 2 (two) times daily for 2 days. Till 07/16/17   oyster calcium 500 MG Tabs tablet Take 500 mg of elemental calcium by mouth 2 (two) times daily.   pentoxifylline 400 MG CR tablet Commonly known as:  TRENTAL Take 400 mg by mouth daily.   pravastatin 80 MG  tablet Commonly known as:  PRAVACHOL Take 80 mg by mouth daily.   saccharomyces boulardii 250 MG capsule Commonly known as:  FLORASTOR Take 1 capsule (250 mg total) by mouth 2 (two) times daily.   SENNOSIDES PO Take 2 tablets by mouth daily.   venlafaxine XR 75 MG 24 hr capsule Commonly known as:  EFFEXOR-XR Take 75 mg by mouth daily with breakfast.     Relevant Imaging Results:  Relevant Lab Results:   Additional Information  SS [id number]  Nila Nephew, LCSW

## 2017-07-15 NOTE — Discharge Instructions (Signed)

## 2017-07-18 DIAGNOSIS — I4891 Unspecified atrial fibrillation: Secondary | ICD-10-CM | POA: Diagnosis not present

## 2017-07-18 DIAGNOSIS — F0390 Unspecified dementia without behavioral disturbance: Secondary | ICD-10-CM | POA: Diagnosis not present

## 2017-07-18 DIAGNOSIS — Z7982 Long term (current) use of aspirin: Secondary | ICD-10-CM | POA: Diagnosis not present

## 2017-07-18 DIAGNOSIS — Z8543 Personal history of malignant neoplasm of ovary: Secondary | ICD-10-CM | POA: Diagnosis not present

## 2017-07-18 DIAGNOSIS — I739 Peripheral vascular disease, unspecified: Secondary | ICD-10-CM | POA: Diagnosis not present

## 2017-07-18 DIAGNOSIS — M810 Age-related osteoporosis without current pathological fracture: Secondary | ICD-10-CM | POA: Diagnosis not present

## 2017-07-18 DIAGNOSIS — E559 Vitamin D deficiency, unspecified: Secondary | ICD-10-CM | POA: Diagnosis not present

## 2017-07-18 DIAGNOSIS — E43 Unspecified severe protein-calorie malnutrition: Secondary | ICD-10-CM | POA: Diagnosis not present

## 2017-07-18 DIAGNOSIS — I10 Essential (primary) hypertension: Secondary | ICD-10-CM | POA: Diagnosis not present

## 2017-07-23 DIAGNOSIS — Z7982 Long term (current) use of aspirin: Secondary | ICD-10-CM | POA: Diagnosis not present

## 2017-07-23 DIAGNOSIS — I10 Essential (primary) hypertension: Secondary | ICD-10-CM | POA: Diagnosis not present

## 2017-07-23 DIAGNOSIS — Z8543 Personal history of malignant neoplasm of ovary: Secondary | ICD-10-CM | POA: Diagnosis not present

## 2017-07-23 DIAGNOSIS — I4891 Unspecified atrial fibrillation: Secondary | ICD-10-CM | POA: Diagnosis not present

## 2017-07-23 DIAGNOSIS — I739 Peripheral vascular disease, unspecified: Secondary | ICD-10-CM | POA: Diagnosis not present

## 2017-07-23 DIAGNOSIS — E559 Vitamin D deficiency, unspecified: Secondary | ICD-10-CM | POA: Diagnosis not present

## 2017-07-23 DIAGNOSIS — M810 Age-related osteoporosis without current pathological fracture: Secondary | ICD-10-CM | POA: Diagnosis not present

## 2017-07-23 DIAGNOSIS — E43 Unspecified severe protein-calorie malnutrition: Secondary | ICD-10-CM | POA: Diagnosis not present

## 2017-07-23 DIAGNOSIS — F0390 Unspecified dementia without behavioral disturbance: Secondary | ICD-10-CM | POA: Diagnosis not present

## 2017-07-25 DIAGNOSIS — F0390 Unspecified dementia without behavioral disturbance: Secondary | ICD-10-CM | POA: Diagnosis not present

## 2017-07-25 DIAGNOSIS — E43 Unspecified severe protein-calorie malnutrition: Secondary | ICD-10-CM | POA: Diagnosis not present

## 2017-07-25 DIAGNOSIS — I10 Essential (primary) hypertension: Secondary | ICD-10-CM | POA: Diagnosis not present

## 2017-07-25 DIAGNOSIS — M810 Age-related osteoporosis without current pathological fracture: Secondary | ICD-10-CM | POA: Diagnosis not present

## 2017-07-25 DIAGNOSIS — Z7982 Long term (current) use of aspirin: Secondary | ICD-10-CM | POA: Diagnosis not present

## 2017-07-25 DIAGNOSIS — E559 Vitamin D deficiency, unspecified: Secondary | ICD-10-CM | POA: Diagnosis not present

## 2017-07-25 DIAGNOSIS — I4891 Unspecified atrial fibrillation: Secondary | ICD-10-CM | POA: Diagnosis not present

## 2017-07-25 DIAGNOSIS — Z8543 Personal history of malignant neoplasm of ovary: Secondary | ICD-10-CM | POA: Diagnosis not present

## 2017-07-25 DIAGNOSIS — I739 Peripheral vascular disease, unspecified: Secondary | ICD-10-CM | POA: Diagnosis not present

## 2017-07-26 DIAGNOSIS — E559 Vitamin D deficiency, unspecified: Secondary | ICD-10-CM | POA: Diagnosis not present

## 2017-07-26 DIAGNOSIS — Z7982 Long term (current) use of aspirin: Secondary | ICD-10-CM | POA: Diagnosis not present

## 2017-07-26 DIAGNOSIS — I10 Essential (primary) hypertension: Secondary | ICD-10-CM | POA: Diagnosis not present

## 2017-07-26 DIAGNOSIS — I739 Peripheral vascular disease, unspecified: Secondary | ICD-10-CM | POA: Diagnosis not present

## 2017-07-26 DIAGNOSIS — F0390 Unspecified dementia without behavioral disturbance: Secondary | ICD-10-CM | POA: Diagnosis not present

## 2017-07-26 DIAGNOSIS — M810 Age-related osteoporosis without current pathological fracture: Secondary | ICD-10-CM | POA: Diagnosis not present

## 2017-07-26 DIAGNOSIS — E43 Unspecified severe protein-calorie malnutrition: Secondary | ICD-10-CM | POA: Diagnosis not present

## 2017-07-26 DIAGNOSIS — Z8543 Personal history of malignant neoplasm of ovary: Secondary | ICD-10-CM | POA: Diagnosis not present

## 2017-07-26 DIAGNOSIS — I4891 Unspecified atrial fibrillation: Secondary | ICD-10-CM | POA: Diagnosis not present

## 2017-07-29 DIAGNOSIS — F0390 Unspecified dementia without behavioral disturbance: Secondary | ICD-10-CM | POA: Diagnosis not present

## 2017-07-29 DIAGNOSIS — Z7982 Long term (current) use of aspirin: Secondary | ICD-10-CM | POA: Diagnosis not present

## 2017-07-29 DIAGNOSIS — I4891 Unspecified atrial fibrillation: Secondary | ICD-10-CM | POA: Diagnosis not present

## 2017-07-29 DIAGNOSIS — E559 Vitamin D deficiency, unspecified: Secondary | ICD-10-CM | POA: Diagnosis not present

## 2017-07-29 DIAGNOSIS — I739 Peripheral vascular disease, unspecified: Secondary | ICD-10-CM | POA: Diagnosis not present

## 2017-07-29 DIAGNOSIS — E43 Unspecified severe protein-calorie malnutrition: Secondary | ICD-10-CM | POA: Diagnosis not present

## 2017-07-29 DIAGNOSIS — Z8543 Personal history of malignant neoplasm of ovary: Secondary | ICD-10-CM | POA: Diagnosis not present

## 2017-07-29 DIAGNOSIS — I10 Essential (primary) hypertension: Secondary | ICD-10-CM | POA: Diagnosis not present

## 2017-07-29 DIAGNOSIS — M810 Age-related osteoporosis without current pathological fracture: Secondary | ICD-10-CM | POA: Diagnosis not present

## 2017-07-30 DIAGNOSIS — I4891 Unspecified atrial fibrillation: Secondary | ICD-10-CM | POA: Diagnosis not present

## 2017-07-30 DIAGNOSIS — I10 Essential (primary) hypertension: Secondary | ICD-10-CM | POA: Diagnosis not present

## 2017-07-30 DIAGNOSIS — I739 Peripheral vascular disease, unspecified: Secondary | ICD-10-CM | POA: Diagnosis not present

## 2017-07-30 DIAGNOSIS — M810 Age-related osteoporosis without current pathological fracture: Secondary | ICD-10-CM | POA: Diagnosis not present

## 2017-07-30 DIAGNOSIS — F0390 Unspecified dementia without behavioral disturbance: Secondary | ICD-10-CM | POA: Diagnosis not present

## 2017-07-30 DIAGNOSIS — E559 Vitamin D deficiency, unspecified: Secondary | ICD-10-CM | POA: Diagnosis not present

## 2017-07-30 DIAGNOSIS — Z8543 Personal history of malignant neoplasm of ovary: Secondary | ICD-10-CM | POA: Diagnosis not present

## 2017-07-30 DIAGNOSIS — E43 Unspecified severe protein-calorie malnutrition: Secondary | ICD-10-CM | POA: Diagnosis not present

## 2017-07-30 DIAGNOSIS — Z7982 Long term (current) use of aspirin: Secondary | ICD-10-CM | POA: Diagnosis not present

## 2017-08-01 DIAGNOSIS — Z7982 Long term (current) use of aspirin: Secondary | ICD-10-CM | POA: Diagnosis not present

## 2017-08-01 DIAGNOSIS — N184 Chronic kidney disease, stage 4 (severe): Secondary | ICD-10-CM | POA: Diagnosis not present

## 2017-08-01 DIAGNOSIS — M810 Age-related osteoporosis without current pathological fracture: Secondary | ICD-10-CM | POA: Diagnosis not present

## 2017-08-01 DIAGNOSIS — I739 Peripheral vascular disease, unspecified: Secondary | ICD-10-CM | POA: Diagnosis not present

## 2017-08-01 DIAGNOSIS — Z09 Encounter for follow-up examination after completed treatment for conditions other than malignant neoplasm: Secondary | ICD-10-CM | POA: Diagnosis not present

## 2017-08-01 DIAGNOSIS — I4891 Unspecified atrial fibrillation: Secondary | ICD-10-CM | POA: Diagnosis not present

## 2017-08-01 DIAGNOSIS — Z8543 Personal history of malignant neoplasm of ovary: Secondary | ICD-10-CM | POA: Diagnosis not present

## 2017-08-01 DIAGNOSIS — E43 Unspecified severe protein-calorie malnutrition: Secondary | ICD-10-CM | POA: Diagnosis not present

## 2017-08-01 DIAGNOSIS — I10 Essential (primary) hypertension: Secondary | ICD-10-CM | POA: Diagnosis not present

## 2017-08-01 DIAGNOSIS — F0390 Unspecified dementia without behavioral disturbance: Secondary | ICD-10-CM | POA: Diagnosis not present

## 2017-08-01 DIAGNOSIS — E559 Vitamin D deficiency, unspecified: Secondary | ICD-10-CM | POA: Diagnosis not present

## 2017-08-02 DIAGNOSIS — I4891 Unspecified atrial fibrillation: Secondary | ICD-10-CM | POA: Diagnosis not present

## 2017-08-02 DIAGNOSIS — Z7982 Long term (current) use of aspirin: Secondary | ICD-10-CM | POA: Diagnosis not present

## 2017-08-02 DIAGNOSIS — E43 Unspecified severe protein-calorie malnutrition: Secondary | ICD-10-CM | POA: Diagnosis not present

## 2017-08-02 DIAGNOSIS — Z8543 Personal history of malignant neoplasm of ovary: Secondary | ICD-10-CM | POA: Diagnosis not present

## 2017-08-02 DIAGNOSIS — I739 Peripheral vascular disease, unspecified: Secondary | ICD-10-CM | POA: Diagnosis not present

## 2017-08-02 DIAGNOSIS — M810 Age-related osteoporosis without current pathological fracture: Secondary | ICD-10-CM | POA: Diagnosis not present

## 2017-08-02 DIAGNOSIS — I10 Essential (primary) hypertension: Secondary | ICD-10-CM | POA: Diagnosis not present

## 2017-08-02 DIAGNOSIS — E559 Vitamin D deficiency, unspecified: Secondary | ICD-10-CM | POA: Diagnosis not present

## 2017-08-02 DIAGNOSIS — F0390 Unspecified dementia without behavioral disturbance: Secondary | ICD-10-CM | POA: Diagnosis not present

## 2017-08-06 DIAGNOSIS — I739 Peripheral vascular disease, unspecified: Secondary | ICD-10-CM | POA: Diagnosis not present

## 2017-08-06 DIAGNOSIS — M810 Age-related osteoporosis without current pathological fracture: Secondary | ICD-10-CM | POA: Diagnosis not present

## 2017-08-06 DIAGNOSIS — Z8543 Personal history of malignant neoplasm of ovary: Secondary | ICD-10-CM | POA: Diagnosis not present

## 2017-08-06 DIAGNOSIS — E559 Vitamin D deficiency, unspecified: Secondary | ICD-10-CM | POA: Diagnosis not present

## 2017-08-06 DIAGNOSIS — E43 Unspecified severe protein-calorie malnutrition: Secondary | ICD-10-CM | POA: Diagnosis not present

## 2017-08-06 DIAGNOSIS — I10 Essential (primary) hypertension: Secondary | ICD-10-CM | POA: Diagnosis not present

## 2017-08-06 DIAGNOSIS — F0390 Unspecified dementia without behavioral disturbance: Secondary | ICD-10-CM | POA: Diagnosis not present

## 2017-08-06 DIAGNOSIS — I4891 Unspecified atrial fibrillation: Secondary | ICD-10-CM | POA: Diagnosis not present

## 2017-08-06 DIAGNOSIS — Z7982 Long term (current) use of aspirin: Secondary | ICD-10-CM | POA: Diagnosis not present

## 2017-08-08 DIAGNOSIS — F0390 Unspecified dementia without behavioral disturbance: Secondary | ICD-10-CM | POA: Diagnosis not present

## 2017-08-08 DIAGNOSIS — Z7982 Long term (current) use of aspirin: Secondary | ICD-10-CM | POA: Diagnosis not present

## 2017-08-08 DIAGNOSIS — I739 Peripheral vascular disease, unspecified: Secondary | ICD-10-CM | POA: Diagnosis not present

## 2017-08-08 DIAGNOSIS — Z8543 Personal history of malignant neoplasm of ovary: Secondary | ICD-10-CM | POA: Diagnosis not present

## 2017-08-08 DIAGNOSIS — I4891 Unspecified atrial fibrillation: Secondary | ICD-10-CM | POA: Diagnosis not present

## 2017-08-08 DIAGNOSIS — E43 Unspecified severe protein-calorie malnutrition: Secondary | ICD-10-CM | POA: Diagnosis not present

## 2017-08-08 DIAGNOSIS — M810 Age-related osteoporosis without current pathological fracture: Secondary | ICD-10-CM | POA: Diagnosis not present

## 2017-08-08 DIAGNOSIS — G8929 Other chronic pain: Secondary | ICD-10-CM | POA: Diagnosis not present

## 2017-08-08 DIAGNOSIS — E559 Vitamin D deficiency, unspecified: Secondary | ICD-10-CM | POA: Diagnosis not present

## 2017-08-08 DIAGNOSIS — Z09 Encounter for follow-up examination after completed treatment for conditions other than malignant neoplasm: Secondary | ICD-10-CM | POA: Diagnosis not present

## 2017-08-08 DIAGNOSIS — I10 Essential (primary) hypertension: Secondary | ICD-10-CM | POA: Diagnosis not present

## 2017-08-16 ENCOUNTER — Ambulatory Visit: Payer: Medicare HMO | Admitting: Podiatry

## 2017-09-13 ENCOUNTER — Ambulatory Visit: Payer: Medicare HMO | Admitting: Podiatry

## 2017-10-01 ENCOUNTER — Ambulatory Visit: Payer: Medicare HMO | Admitting: Podiatry

## 2017-10-01 ENCOUNTER — Encounter: Payer: Self-pay | Admitting: Podiatry

## 2017-10-01 DIAGNOSIS — B351 Tinea unguium: Secondary | ICD-10-CM

## 2017-10-01 DIAGNOSIS — M79676 Pain in unspecified toe(s): Secondary | ICD-10-CM | POA: Diagnosis not present

## 2017-10-02 NOTE — Progress Notes (Signed)
   SUBJECTIVE Patient presents to office today complaining of elongated, thickened nails that cause pain while ambulating in shoes. She is unable to trim her own nails. Patient is here for further evaluation and treatment.  Past Medical History:  Diagnosis Date  . Aortic regurgitation   . Atrial fibrillation ([place])   . Dementia   . Depression   . Gallstone pancreatitis   . Hyperlipidemia   . Hypertension   . Ovarian cancer [facility])     OBJECTIVE General Patient is awake, alert, and oriented x 3 and in no acute distress. Derm Skin is dry and supple bilateral. Negative open lesions or macerations. Remaining integument unremarkable. Nails are tender, long, thickened and dystrophic with subungual debris, consistent with onychomycosis, 1-5 bilateral. No signs of infection noted. Vasc  DP and PT pedal pulses palpable bilaterally. Temperature gradient within normal limits.  Neuro Epicritic and protective threshold sensation grossly intact bilaterally.  Musculoskeletal Exam No symptomatic pedal deformities noted bilateral. Muscular strength within normal limits.  ASSESSMENT 1. Onychodystrophic nails 1-5 bilateral with hyperkeratosis of nails.  2. Onychomycosis of nail due to dermatophyte bilateral 3. Pain in foot bilateral  PLAN OF CARE 1. Patient evaluated today.  2. Instructed to maintain good pedal hygiene and foot care.  3. Mechanical debridement of nails 1-5 bilaterally performed using a nail nipper. Filed with dremel without incident.  4. Return to clinic in 3 mos.    Edrick Kins, DPM [facility]  Dr. Edrick Kins, [place]                                        [place], [place] [address]                Office [phone]  Fax [phone]

## 2017-10-09 DIAGNOSIS — N184 Chronic kidney disease, stage 4 (severe): Secondary | ICD-10-CM | POA: Diagnosis not present

## 2017-10-09 DIAGNOSIS — I700 Atherosclerosis of aorta: Secondary | ICD-10-CM | POA: Diagnosis not present

## 2017-10-09 DIAGNOSIS — I480 Paroxysmal atrial fibrillation: Secondary | ICD-10-CM | POA: Diagnosis not present

## 2017-10-09 DIAGNOSIS — G301 Alzheimer's disease with late onset: Secondary | ICD-10-CM | POA: Diagnosis not present

## 2017-10-09 DIAGNOSIS — H6120 Impacted cerumen, unspecified ear: Secondary | ICD-10-CM | POA: Diagnosis not present

## 2017-10-09 DIAGNOSIS — I739 Peripheral vascular disease, unspecified: Secondary | ICD-10-CM | POA: Diagnosis not present

## 2017-10-09 DIAGNOSIS — M5137 Other intervertebral disc degeneration, lumbosacral region: Secondary | ICD-10-CM | POA: Diagnosis not present

## 2017-10-09 DIAGNOSIS — I75029 Atheroembolism of unspecified lower extremity: Secondary | ICD-10-CM | POA: Diagnosis not present

## 2017-10-09 DIAGNOSIS — I129 Hypertensive chronic kidney disease with stage 1 through stage 4 chronic kidney disease, or unspecified chronic kidney disease: Secondary | ICD-10-CM | POA: Diagnosis not present

## 2017-11-12 DIAGNOSIS — E785 Hyperlipidemia, unspecified: Secondary | ICD-10-CM | POA: Diagnosis not present

## 2017-11-12 DIAGNOSIS — R413 Other amnesia: Secondary | ICD-10-CM | POA: Diagnosis not present

## 2017-11-12 DIAGNOSIS — E039 Hypothyroidism, unspecified: Secondary | ICD-10-CM | POA: Diagnosis not present

## 2017-11-12 DIAGNOSIS — M810 Age-related osteoporosis without current pathological fracture: Secondary | ICD-10-CM | POA: Diagnosis not present

## 2017-11-12 DIAGNOSIS — R2689 Other abnormalities of gait and mobility: Secondary | ICD-10-CM | POA: Diagnosis not present

## 2017-11-12 DIAGNOSIS — G301 Alzheimer's disease with late onset: Secondary | ICD-10-CM | POA: Diagnosis not present

## 2017-11-12 DIAGNOSIS — I129 Hypertensive chronic kidney disease with stage 1 through stage 4 chronic kidney disease, or unspecified chronic kidney disease: Secondary | ICD-10-CM | POA: Diagnosis not present

## 2017-12-05 DIAGNOSIS — R2689 Other abnormalities of gait and mobility: Secondary | ICD-10-CM | POA: Diagnosis not present

## 2017-12-05 DIAGNOSIS — R2681 Unsteadiness on feet: Secondary | ICD-10-CM | POA: Diagnosis not present

## 2017-12-05 DIAGNOSIS — E039 Hypothyroidism, unspecified: Secondary | ICD-10-CM | POA: Diagnosis not present

## 2017-12-05 DIAGNOSIS — F419 Anxiety disorder, unspecified: Secondary | ICD-10-CM | POA: Diagnosis not present

## 2017-12-05 DIAGNOSIS — D649 Anemia, unspecified: Secondary | ICD-10-CM | POA: Diagnosis not present

## 2017-12-05 DIAGNOSIS — R413 Other amnesia: Secondary | ICD-10-CM | POA: Diagnosis not present

## 2017-12-05 DIAGNOSIS — E785 Hyperlipidemia, unspecified: Secondary | ICD-10-CM | POA: Diagnosis not present

## 2017-12-05 DIAGNOSIS — M6281 Muscle weakness (generalized): Secondary | ICD-10-CM | POA: Diagnosis not present

## 2017-12-05 DIAGNOSIS — G309 Alzheimer's disease, unspecified: Secondary | ICD-10-CM | POA: Diagnosis not present

## 2017-12-05 DIAGNOSIS — R41841 Cognitive communication deficit: Secondary | ICD-10-CM | POA: Diagnosis not present

## 2017-12-05 DIAGNOSIS — F0280 Dementia in other diseases classified elsewhere without behavioral disturbance: Secondary | ICD-10-CM | POA: Diagnosis not present

## 2017-12-06 DIAGNOSIS — R2681 Unsteadiness on feet: Secondary | ICD-10-CM | POA: Diagnosis not present

## 2017-12-06 DIAGNOSIS — M6281 Muscle weakness (generalized): Secondary | ICD-10-CM | POA: Diagnosis not present

## 2017-12-06 DIAGNOSIS — R2689 Other abnormalities of gait and mobility: Secondary | ICD-10-CM | POA: Diagnosis not present

## 2017-12-06 DIAGNOSIS — R41841 Cognitive communication deficit: Secondary | ICD-10-CM | POA: Diagnosis not present

## 2017-12-07 DIAGNOSIS — M6281 Muscle weakness (generalized): Secondary | ICD-10-CM | POA: Diagnosis not present

## 2017-12-07 DIAGNOSIS — R2681 Unsteadiness on feet: Secondary | ICD-10-CM | POA: Diagnosis not present

## 2017-12-07 DIAGNOSIS — R41841 Cognitive communication deficit: Secondary | ICD-10-CM | POA: Diagnosis not present

## 2017-12-07 DIAGNOSIS — R2689 Other abnormalities of gait and mobility: Secondary | ICD-10-CM | POA: Diagnosis not present

## 2017-12-09 DIAGNOSIS — F419 Anxiety disorder, unspecified: Secondary | ICD-10-CM | POA: Diagnosis not present

## 2017-12-09 DIAGNOSIS — G309 Alzheimer's disease, unspecified: Secondary | ICD-10-CM | POA: Diagnosis not present

## 2017-12-09 DIAGNOSIS — R2681 Unsteadiness on feet: Secondary | ICD-10-CM | POA: Diagnosis not present

## 2017-12-09 DIAGNOSIS — R2689 Other abnormalities of gait and mobility: Secondary | ICD-10-CM | POA: Diagnosis not present

## 2017-12-09 DIAGNOSIS — R41841 Cognitive communication deficit: Secondary | ICD-10-CM | POA: Diagnosis not present

## 2017-12-09 DIAGNOSIS — M6281 Muscle weakness (generalized): Secondary | ICD-10-CM | POA: Diagnosis not present

## 2017-12-09 DIAGNOSIS — M810 Age-related osteoporosis without current pathological fracture: Secondary | ICD-10-CM | POA: Diagnosis not present

## 2017-12-09 DIAGNOSIS — I10 Essential (primary) hypertension: Secondary | ICD-10-CM | POA: Diagnosis not present

## 2017-12-10 DIAGNOSIS — R2689 Other abnormalities of gait and mobility: Secondary | ICD-10-CM | POA: Diagnosis not present

## 2017-12-10 DIAGNOSIS — R41841 Cognitive communication deficit: Secondary | ICD-10-CM | POA: Diagnosis not present

## 2017-12-10 DIAGNOSIS — M6281 Muscle weakness (generalized): Secondary | ICD-10-CM | POA: Diagnosis not present

## 2017-12-10 DIAGNOSIS — R2681 Unsteadiness on feet: Secondary | ICD-10-CM | POA: Diagnosis not present

## 2017-12-11 DIAGNOSIS — R2689 Other abnormalities of gait and mobility: Secondary | ICD-10-CM | POA: Diagnosis not present

## 2017-12-11 DIAGNOSIS — R41841 Cognitive communication deficit: Secondary | ICD-10-CM | POA: Diagnosis not present

## 2017-12-11 DIAGNOSIS — R2681 Unsteadiness on feet: Secondary | ICD-10-CM | POA: Diagnosis not present

## 2017-12-11 DIAGNOSIS — M6281 Muscle weakness (generalized): Secondary | ICD-10-CM | POA: Diagnosis not present

## 2017-12-12 DIAGNOSIS — R2689 Other abnormalities of gait and mobility: Secondary | ICD-10-CM | POA: Diagnosis not present

## 2017-12-12 DIAGNOSIS — R41841 Cognitive communication deficit: Secondary | ICD-10-CM | POA: Diagnosis not present

## 2017-12-12 DIAGNOSIS — R2681 Unsteadiness on feet: Secondary | ICD-10-CM | POA: Diagnosis not present

## 2017-12-12 DIAGNOSIS — M6281 Muscle weakness (generalized): Secondary | ICD-10-CM | POA: Diagnosis not present

## 2017-12-13 DIAGNOSIS — M6281 Muscle weakness (generalized): Secondary | ICD-10-CM | POA: Diagnosis not present

## 2017-12-13 DIAGNOSIS — R2681 Unsteadiness on feet: Secondary | ICD-10-CM | POA: Diagnosis not present

## 2017-12-13 DIAGNOSIS — R41841 Cognitive communication deficit: Secondary | ICD-10-CM | POA: Diagnosis not present

## 2017-12-13 DIAGNOSIS — R2689 Other abnormalities of gait and mobility: Secondary | ICD-10-CM | POA: Diagnosis not present

## 2017-12-16 DIAGNOSIS — R2681 Unsteadiness on feet: Secondary | ICD-10-CM | POA: Diagnosis not present

## 2017-12-16 DIAGNOSIS — M6281 Muscle weakness (generalized): Secondary | ICD-10-CM | POA: Diagnosis not present

## 2017-12-16 DIAGNOSIS — R41841 Cognitive communication deficit: Secondary | ICD-10-CM | POA: Diagnosis not present

## 2017-12-16 DIAGNOSIS — G309 Alzheimer's disease, unspecified: Secondary | ICD-10-CM | POA: Diagnosis not present

## 2017-12-16 DIAGNOSIS — M810 Age-related osteoporosis without current pathological fracture: Secondary | ICD-10-CM | POA: Diagnosis not present

## 2017-12-16 DIAGNOSIS — F419 Anxiety disorder, unspecified: Secondary | ICD-10-CM | POA: Diagnosis not present

## 2017-12-16 DIAGNOSIS — F0280 Dementia in other diseases classified elsewhere without behavioral disturbance: Secondary | ICD-10-CM | POA: Diagnosis not present

## 2017-12-16 DIAGNOSIS — R2689 Other abnormalities of gait and mobility: Secondary | ICD-10-CM | POA: Diagnosis not present

## 2017-12-17 DIAGNOSIS — R41841 Cognitive communication deficit: Secondary | ICD-10-CM | POA: Diagnosis not present

## 2017-12-17 DIAGNOSIS — M6281 Muscle weakness (generalized): Secondary | ICD-10-CM | POA: Diagnosis not present

## 2017-12-17 DIAGNOSIS — R2681 Unsteadiness on feet: Secondary | ICD-10-CM | POA: Diagnosis not present

## 2017-12-17 DIAGNOSIS — R2689 Other abnormalities of gait and mobility: Secondary | ICD-10-CM | POA: Diagnosis not present

## 2017-12-18 DIAGNOSIS — R41841 Cognitive communication deficit: Secondary | ICD-10-CM | POA: Diagnosis not present

## 2017-12-18 DIAGNOSIS — R2681 Unsteadiness on feet: Secondary | ICD-10-CM | POA: Diagnosis not present

## 2017-12-18 DIAGNOSIS — M6281 Muscle weakness (generalized): Secondary | ICD-10-CM | POA: Diagnosis not present

## 2017-12-18 DIAGNOSIS — R2689 Other abnormalities of gait and mobility: Secondary | ICD-10-CM | POA: Diagnosis not present

## 2017-12-19 DIAGNOSIS — M6281 Muscle weakness (generalized): Secondary | ICD-10-CM | POA: Diagnosis not present

## 2017-12-19 DIAGNOSIS — R2689 Other abnormalities of gait and mobility: Secondary | ICD-10-CM | POA: Diagnosis not present

## 2017-12-19 DIAGNOSIS — R2681 Unsteadiness on feet: Secondary | ICD-10-CM | POA: Diagnosis not present

## 2017-12-19 DIAGNOSIS — R41841 Cognitive communication deficit: Secondary | ICD-10-CM | POA: Diagnosis not present

## 2017-12-23 DIAGNOSIS — F419 Anxiety disorder, unspecified: Secondary | ICD-10-CM | POA: Diagnosis not present

## 2017-12-23 DIAGNOSIS — I10 Essential (primary) hypertension: Secondary | ICD-10-CM | POA: Diagnosis not present

## 2017-12-23 DIAGNOSIS — F0280 Dementia in other diseases classified elsewhere without behavioral disturbance: Secondary | ICD-10-CM | POA: Diagnosis not present

## 2017-12-23 DIAGNOSIS — G309 Alzheimer's disease, unspecified: Secondary | ICD-10-CM | POA: Diagnosis not present

## 2017-12-30 DIAGNOSIS — G309 Alzheimer's disease, unspecified: Secondary | ICD-10-CM | POA: Diagnosis not present

## 2017-12-30 DIAGNOSIS — M810 Age-related osteoporosis without current pathological fracture: Secondary | ICD-10-CM | POA: Diagnosis not present

## 2017-12-30 DIAGNOSIS — F0280 Dementia in other diseases classified elsewhere without behavioral disturbance: Secondary | ICD-10-CM | POA: Diagnosis not present

## 2017-12-30 DIAGNOSIS — F419 Anxiety disorder, unspecified: Secondary | ICD-10-CM | POA: Diagnosis not present

## 2018-01-07 ENCOUNTER — Encounter: Payer: Medicare HMO | Admitting: Podiatry

## 2018-01-07 DIAGNOSIS — I10 Essential (primary) hypertension: Secondary | ICD-10-CM | POA: Diagnosis not present

## 2018-01-07 DIAGNOSIS — M810 Age-related osteoporosis without current pathological fracture: Secondary | ICD-10-CM | POA: Diagnosis not present

## 2018-01-07 DIAGNOSIS — G309 Alzheimer's disease, unspecified: Secondary | ICD-10-CM | POA: Diagnosis not present

## 2018-01-07 DIAGNOSIS — F0280 Dementia in other diseases classified elsewhere without behavioral disturbance: Secondary | ICD-10-CM | POA: Diagnosis not present

## 2018-01-10 NOTE — Progress Notes (Signed)
This encounter was created in error - please disregard.

## 2018-01-21 DIAGNOSIS — F419 Anxiety disorder, unspecified: Secondary | ICD-10-CM | POA: Diagnosis not present

## 2018-01-21 DIAGNOSIS — I129 Hypertensive chronic kidney disease with stage 1 through stage 4 chronic kidney disease, or unspecified chronic kidney disease: Secondary | ICD-10-CM | POA: Diagnosis not present

## 2018-01-21 DIAGNOSIS — G309 Alzheimer's disease, unspecified: Secondary | ICD-10-CM | POA: Diagnosis not present

## 2018-01-21 DIAGNOSIS — I10 Essential (primary) hypertension: Secondary | ICD-10-CM | POA: Diagnosis not present

## 2018-01-23 DIAGNOSIS — J159 Unspecified bacterial pneumonia: Secondary | ICD-10-CM | POA: Diagnosis not present

## 2018-01-23 DIAGNOSIS — R05 Cough: Secondary | ICD-10-CM | POA: Diagnosis not present

## 2018-01-29 DIAGNOSIS — Z961 Presence of intraocular lens: Secondary | ICD-10-CM | POA: Diagnosis not present

## 2018-01-31 DIAGNOSIS — R52 Pain, unspecified: Secondary | ICD-10-CM | POA: Diagnosis not present

## 2018-02-04 DIAGNOSIS — G309 Alzheimer's disease, unspecified: Secondary | ICD-10-CM | POA: Diagnosis not present

## 2018-02-04 DIAGNOSIS — F419 Anxiety disorder, unspecified: Secondary | ICD-10-CM | POA: Diagnosis not present

## 2018-02-04 DIAGNOSIS — I82402 Acute embolism and thrombosis of unspecified deep veins of left lower extremity: Secondary | ICD-10-CM | POA: Diagnosis not present

## 2018-02-04 DIAGNOSIS — F0280 Dementia in other diseases classified elsewhere without behavioral disturbance: Secondary | ICD-10-CM | POA: Diagnosis not present

## 2018-02-10 DIAGNOSIS — B351 Tinea unguium: Secondary | ICD-10-CM | POA: Diagnosis not present

## 2018-02-10 DIAGNOSIS — L84 Corns and callosities: Secondary | ICD-10-CM | POA: Diagnosis not present

## 2018-02-10 DIAGNOSIS — M2042 Other hammer toe(s) (acquired), left foot: Secondary | ICD-10-CM | POA: Diagnosis not present

## 2018-02-11 DIAGNOSIS — F329 Major depressive disorder, single episode, unspecified: Secondary | ICD-10-CM | POA: Diagnosis not present

## 2018-02-11 DIAGNOSIS — F419 Anxiety disorder, unspecified: Secondary | ICD-10-CM | POA: Diagnosis not present

## 2018-02-12 DIAGNOSIS — R52 Pain, unspecified: Secondary | ICD-10-CM | POA: Diagnosis not present

## 2018-02-20 DIAGNOSIS — I129 Hypertensive chronic kidney disease with stage 1 through stage 4 chronic kidney disease, or unspecified chronic kidney disease: Secondary | ICD-10-CM | POA: Diagnosis not present

## 2018-02-20 DIAGNOSIS — F419 Anxiety disorder, unspecified: Secondary | ICD-10-CM | POA: Diagnosis not present

## 2018-02-20 DIAGNOSIS — I82402 Acute embolism and thrombosis of unspecified deep veins of left lower extremity: Secondary | ICD-10-CM | POA: Diagnosis not present

## 2018-02-20 DIAGNOSIS — F0280 Dementia in other diseases classified elsewhere without behavioral disturbance: Secondary | ICD-10-CM | POA: Diagnosis not present

## 2018-03-04 DIAGNOSIS — G309 Alzheimer's disease, unspecified: Secondary | ICD-10-CM | POA: Diagnosis not present

## 2018-03-04 DIAGNOSIS — F419 Anxiety disorder, unspecified: Secondary | ICD-10-CM | POA: Diagnosis not present

## 2018-03-04 DIAGNOSIS — F0280 Dementia in other diseases classified elsewhere without behavioral disturbance: Secondary | ICD-10-CM | POA: Diagnosis not present

## 2018-03-04 DIAGNOSIS — I82402 Acute embolism and thrombosis of unspecified deep veins of left lower extremity: Secondary | ICD-10-CM | POA: Diagnosis not present

## 2018-03-10 DIAGNOSIS — I82402 Acute embolism and thrombosis of unspecified deep veins of left lower extremity: Secondary | ICD-10-CM | POA: Diagnosis not present

## 2018-03-18 DIAGNOSIS — F0280 Dementia in other diseases classified elsewhere without behavioral disturbance: Secondary | ICD-10-CM | POA: Diagnosis not present

## 2018-03-18 DIAGNOSIS — E785 Hyperlipidemia, unspecified: Secondary | ICD-10-CM | POA: Diagnosis not present

## 2018-03-18 DIAGNOSIS — K219 Gastro-esophageal reflux disease without esophagitis: Secondary | ICD-10-CM | POA: Diagnosis not present

## 2018-03-18 DIAGNOSIS — I82402 Acute embolism and thrombosis of unspecified deep veins of left lower extremity: Secondary | ICD-10-CM | POA: Diagnosis not present

## 2018-04-01 DIAGNOSIS — G309 Alzheimer's disease, unspecified: Secondary | ICD-10-CM | POA: Diagnosis not present

## 2018-04-01 DIAGNOSIS — F0280 Dementia in other diseases classified elsewhere without behavioral disturbance: Secondary | ICD-10-CM | POA: Diagnosis not present

## 2018-04-01 DIAGNOSIS — I82402 Acute embolism and thrombosis of unspecified deep veins of left lower extremity: Secondary | ICD-10-CM | POA: Diagnosis not present

## 2018-04-15 DIAGNOSIS — I82402 Acute embolism and thrombosis of unspecified deep veins of left lower extremity: Secondary | ICD-10-CM | POA: Diagnosis not present

## 2018-04-15 DIAGNOSIS — K219 Gastro-esophageal reflux disease without esophagitis: Secondary | ICD-10-CM | POA: Diagnosis not present

## 2018-04-15 DIAGNOSIS — F0280 Dementia in other diseases classified elsewhere without behavioral disturbance: Secondary | ICD-10-CM | POA: Diagnosis not present

## 2018-04-15 DIAGNOSIS — G309 Alzheimer's disease, unspecified: Secondary | ICD-10-CM | POA: Diagnosis not present

## 2018-04-29 DIAGNOSIS — G309 Alzheimer's disease, unspecified: Secondary | ICD-10-CM | POA: Diagnosis not present

## 2018-04-29 DIAGNOSIS — F419 Anxiety disorder, unspecified: Secondary | ICD-10-CM | POA: Diagnosis not present

## 2018-04-29 DIAGNOSIS — I10 Essential (primary) hypertension: Secondary | ICD-10-CM | POA: Diagnosis not present

## 2018-04-29 DIAGNOSIS — M810 Age-related osteoporosis without current pathological fracture: Secondary | ICD-10-CM | POA: Diagnosis not present

## 2018-05-13 DIAGNOSIS — F419 Anxiety disorder, unspecified: Secondary | ICD-10-CM | POA: Diagnosis not present

## 2018-05-13 DIAGNOSIS — I10 Essential (primary) hypertension: Secondary | ICD-10-CM | POA: Diagnosis not present

## 2018-05-13 DIAGNOSIS — G309 Alzheimer's disease, unspecified: Secondary | ICD-10-CM | POA: Diagnosis not present

## 2018-05-13 DIAGNOSIS — M810 Age-related osteoporosis without current pathological fracture: Secondary | ICD-10-CM | POA: Diagnosis not present

## 2018-05-27 DIAGNOSIS — F0280 Dementia in other diseases classified elsewhere without behavioral disturbance: Secondary | ICD-10-CM | POA: Diagnosis not present

## 2018-05-27 DIAGNOSIS — G309 Alzheimer's disease, unspecified: Secondary | ICD-10-CM | POA: Diagnosis not present

## 2018-05-27 DIAGNOSIS — M810 Age-related osteoporosis without current pathological fracture: Secondary | ICD-10-CM | POA: Diagnosis not present

## 2018-05-27 DIAGNOSIS — I10 Essential (primary) hypertension: Secondary | ICD-10-CM | POA: Diagnosis not present

## 2018-06-10 DIAGNOSIS — E785 Hyperlipidemia, unspecified: Secondary | ICD-10-CM | POA: Diagnosis not present

## 2018-06-10 DIAGNOSIS — F0280 Dementia in other diseases classified elsewhere without behavioral disturbance: Secondary | ICD-10-CM | POA: Diagnosis not present

## 2018-06-10 DIAGNOSIS — I10 Essential (primary) hypertension: Secondary | ICD-10-CM | POA: Diagnosis not present

## 2018-06-10 DIAGNOSIS — G309 Alzheimer's disease, unspecified: Secondary | ICD-10-CM | POA: Diagnosis not present

## 2018-06-20 DIAGNOSIS — Z23 Encounter for immunization: Secondary | ICD-10-CM | POA: Diagnosis not present

## 2018-06-24 DIAGNOSIS — G309 Alzheimer's disease, unspecified: Secondary | ICD-10-CM | POA: Diagnosis not present

## 2018-06-24 DIAGNOSIS — E639 Nutritional deficiency, unspecified: Secondary | ICD-10-CM | POA: Diagnosis not present

## 2018-06-24 DIAGNOSIS — E785 Hyperlipidemia, unspecified: Secondary | ICD-10-CM | POA: Diagnosis not present

## 2018-06-24 DIAGNOSIS — F0280 Dementia in other diseases classified elsewhere without behavioral disturbance: Secondary | ICD-10-CM | POA: Diagnosis not present

## 2018-07-08 DIAGNOSIS — G309 Alzheimer's disease, unspecified: Secondary | ICD-10-CM | POA: Diagnosis not present

## 2018-07-08 DIAGNOSIS — E639 Nutritional deficiency, unspecified: Secondary | ICD-10-CM | POA: Diagnosis not present

## 2018-07-08 DIAGNOSIS — E785 Hyperlipidemia, unspecified: Secondary | ICD-10-CM | POA: Diagnosis not present

## 2018-07-08 DIAGNOSIS — F0280 Dementia in other diseases classified elsewhere without behavioral disturbance: Secondary | ICD-10-CM | POA: Diagnosis not present

## 2018-07-22 DIAGNOSIS — E639 Nutritional deficiency, unspecified: Secondary | ICD-10-CM | POA: Diagnosis not present

## 2018-07-22 DIAGNOSIS — E785 Hyperlipidemia, unspecified: Secondary | ICD-10-CM | POA: Diagnosis not present

## 2018-07-22 DIAGNOSIS — I10 Essential (primary) hypertension: Secondary | ICD-10-CM | POA: Diagnosis not present

## 2018-07-22 DIAGNOSIS — G309 Alzheimer's disease, unspecified: Secondary | ICD-10-CM | POA: Diagnosis not present

## 2018-08-05 DIAGNOSIS — G309 Alzheimer's disease, unspecified: Secondary | ICD-10-CM | POA: Diagnosis not present

## 2018-08-05 DIAGNOSIS — F0280 Dementia in other diseases classified elsewhere without behavioral disturbance: Secondary | ICD-10-CM | POA: Diagnosis not present

## 2018-08-05 DIAGNOSIS — E639 Nutritional deficiency, unspecified: Secondary | ICD-10-CM | POA: Diagnosis not present

## 2018-08-05 DIAGNOSIS — E785 Hyperlipidemia, unspecified: Secondary | ICD-10-CM | POA: Diagnosis not present

## 2018-08-19 DIAGNOSIS — F329 Major depressive disorder, single episode, unspecified: Secondary | ICD-10-CM | POA: Diagnosis not present

## 2018-08-19 DIAGNOSIS — E46 Unspecified protein-calorie malnutrition: Secondary | ICD-10-CM | POA: Diagnosis not present

## 2018-08-19 DIAGNOSIS — G309 Alzheimer's disease, unspecified: Secondary | ICD-10-CM | POA: Diagnosis not present

## 2018-08-19 DIAGNOSIS — F0280 Dementia in other diseases classified elsewhere without behavioral disturbance: Secondary | ICD-10-CM | POA: Diagnosis not present

## 2018-08-20 DIAGNOSIS — D649 Anemia, unspecified: Secondary | ICD-10-CM | POA: Diagnosis not present

## 2018-08-20 DIAGNOSIS — Z79899 Other long term (current) drug therapy: Secondary | ICD-10-CM | POA: Diagnosis not present

## 2018-08-20 DIAGNOSIS — E559 Vitamin D deficiency, unspecified: Secondary | ICD-10-CM | POA: Diagnosis not present

## 2018-09-02 DIAGNOSIS — E46 Unspecified protein-calorie malnutrition: Secondary | ICD-10-CM | POA: Diagnosis not present

## 2018-09-02 DIAGNOSIS — G309 Alzheimer's disease, unspecified: Secondary | ICD-10-CM | POA: Diagnosis not present

## 2018-09-02 DIAGNOSIS — F0280 Dementia in other diseases classified elsewhere without behavioral disturbance: Secondary | ICD-10-CM | POA: Diagnosis not present

## 2018-09-02 DIAGNOSIS — I129 Hypertensive chronic kidney disease with stage 1 through stage 4 chronic kidney disease, or unspecified chronic kidney disease: Secondary | ICD-10-CM | POA: Diagnosis not present

## 2018-11-03 IMAGING — CT CT ABD-PELV W/ CM
2 of 5 series · 15 of 46 positions shown, 17 images · IV contrast (APPLIED)
Comparison: Radiograph 09/04/2016, CT chest 07/15/2008

ADDENDUM:
At least 2 stones in the distal common bile duct with prominent
common bile duct wall enhancement. Consider cholangitis as cause of
symptoms. These findings were discussed with Dr. Onika. The 2 small
low densities in the right liver could reflect developing
biloma/abscess. No drainable collection.
CLINICAL DATA: Dementia generalize weakness fever positive weight
loss abnormal liver function

EXAM:
CT ABDOMEN AND PELVIS WITH CONTRAST
TECHNIQUE: Multidetector CT imaging of the abdomen and pelvis was performed
using the standard protocol following bolus administration of
intravenous contrast.
CONTRAST:  100mL QGSO2O-BAA IOPAMIDOL (QGSO2O-BAA) INJECTION 61%

[Series 2: axial st · axial · 0.81mm/px · z∈[+795,+1185]mm · 12 of 90 slices shown, 14 images]
[im 6/90  soft-tissue]
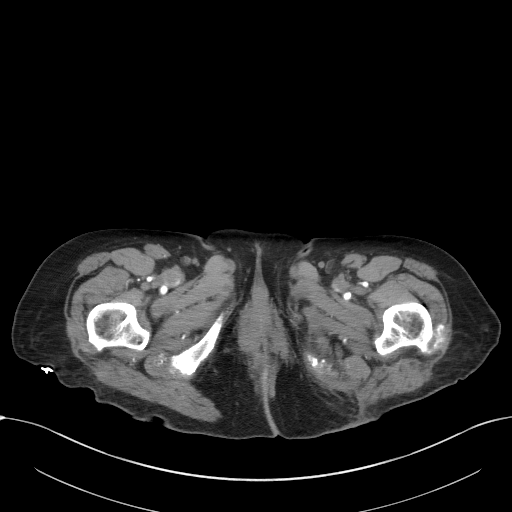
[im 6/90  bone]
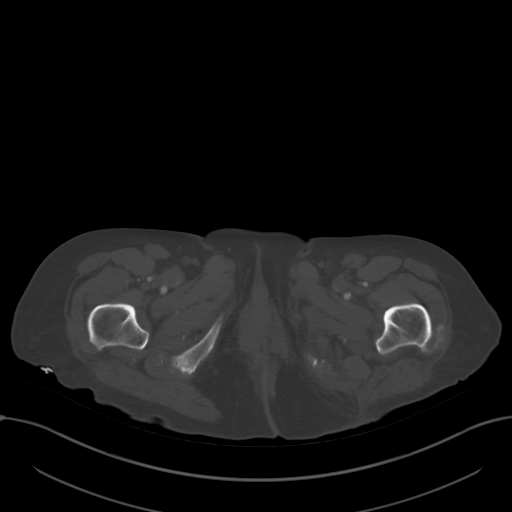
[im 12/90  soft-tissue]
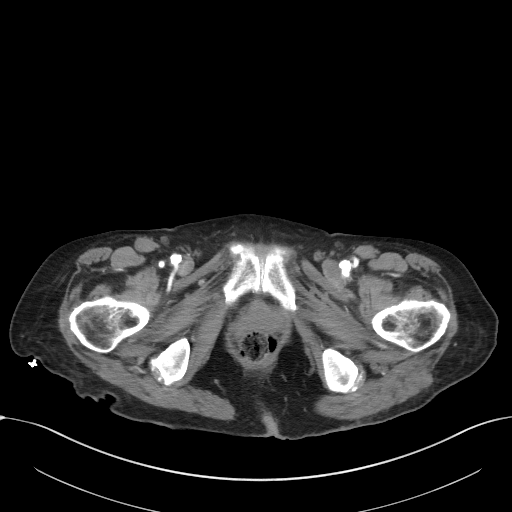
[im 18/90  soft-tissue]
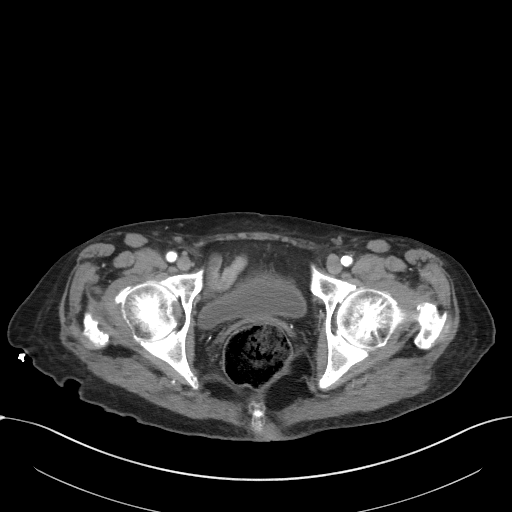
[im 30/90  soft-tissue]
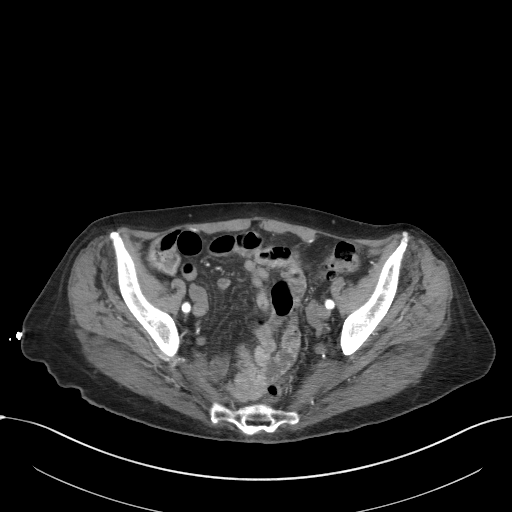
[im 36/90  soft-tissue]
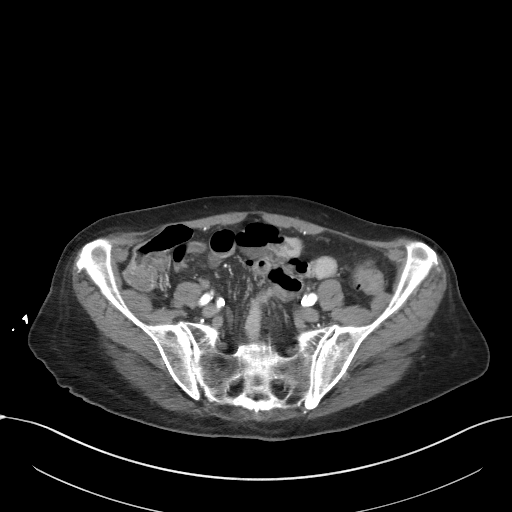
[im 42/90  soft-tissue]
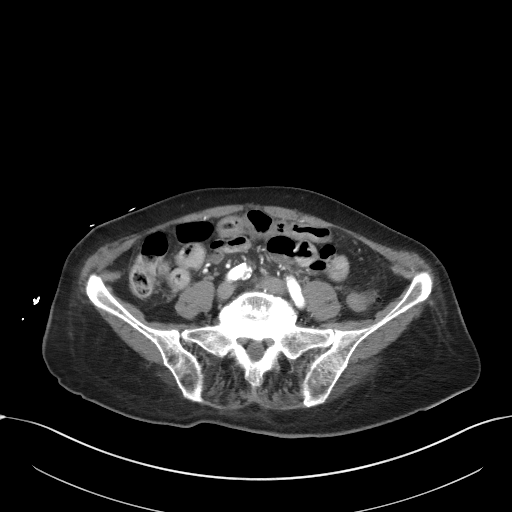
[im 48/90  soft-tissue]
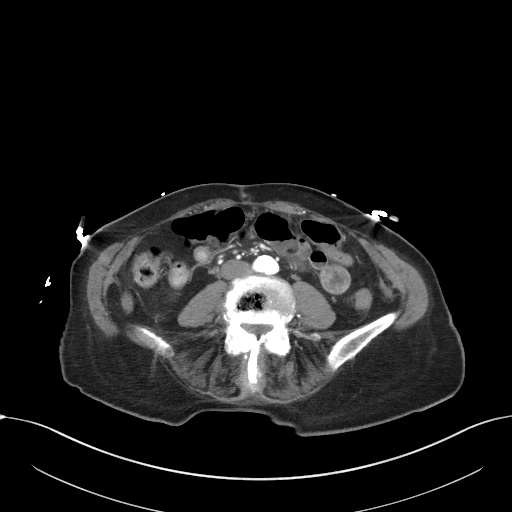
[im 54/90  soft-tissue]
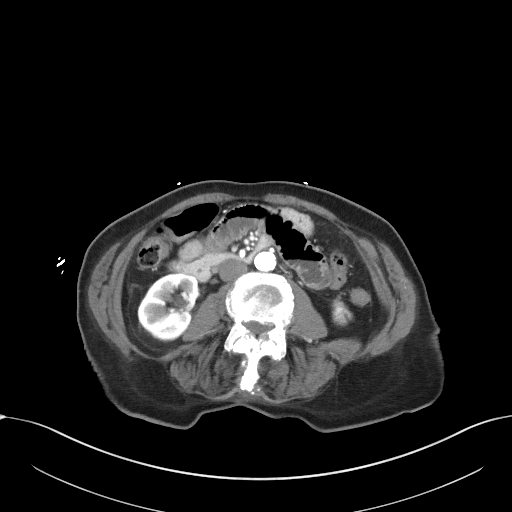
[im 60/90  soft-tissue]
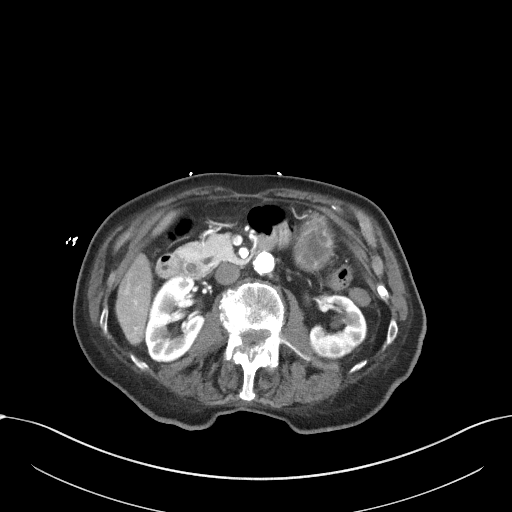
[im 60/90  bone]
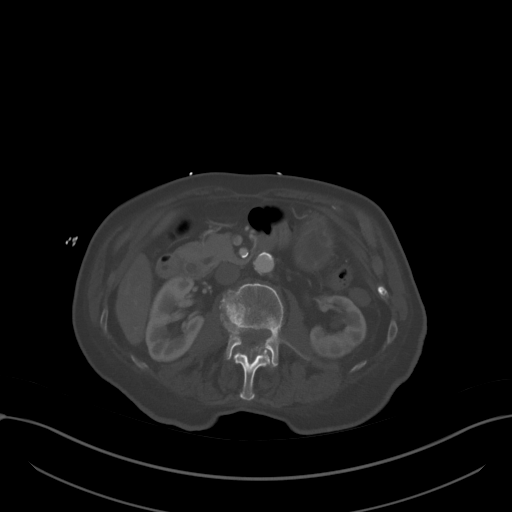
[im 72/90  soft-tissue]
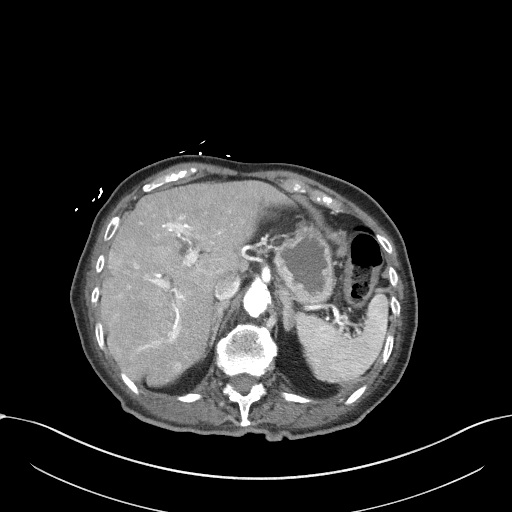
[im 78/90  soft-tissue]
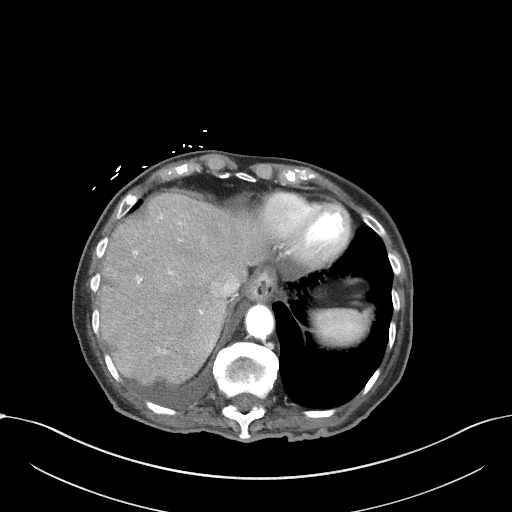
[im 84/90  soft-tissue]
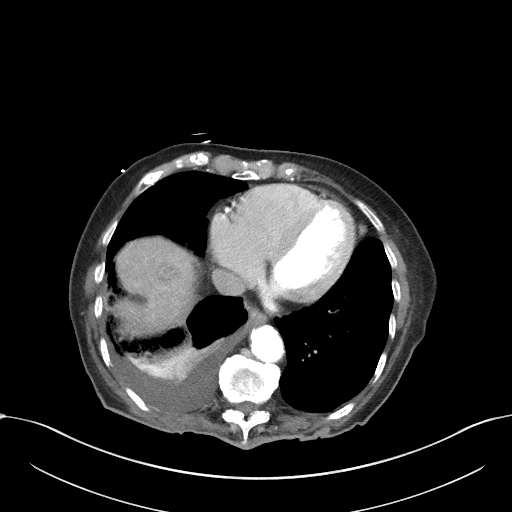

[Series 5: coronal st · coronal · 0.68mm/px · 3 of 86 slices shown]
[im 29/86  soft-tissue]
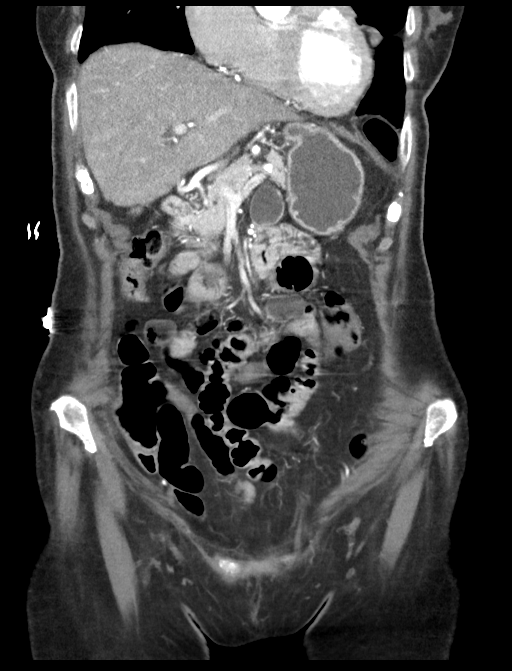
[im 38/86  soft-tissue]
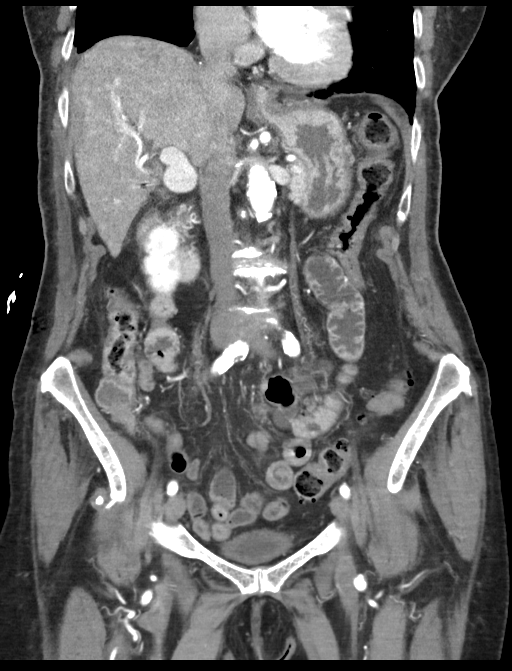
[im 48/86  soft-tissue]
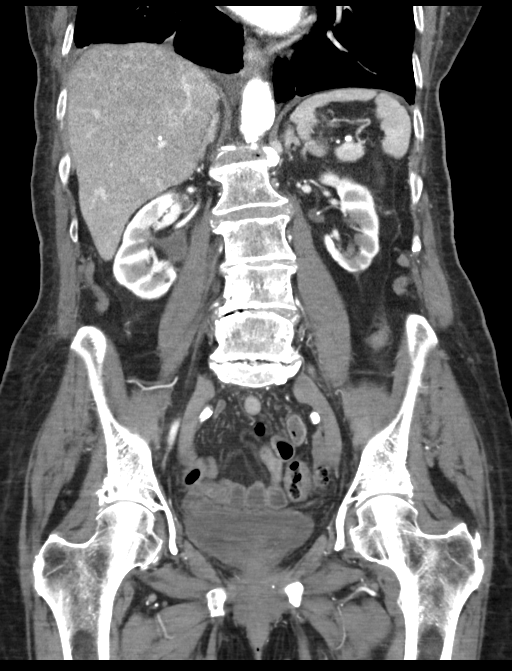

[15 of 46 positions shown; findings below may reference images not displayed]

FINDINGS: Lower chest: Small right-sided pleural effusion with partial
atelectasis at the right base. The left lung base is clear.
Borderline heart size. Coronary vascular calcification.

Hepatobiliary: Heterogeneous liver parenchyma with vague
hypoenhancing foci in the right lobe, for example series 2, image
number 18 measuring 3 mm, and within the dome of the liver measuring
approximately 1.9 cm. Surgical clips in the gallbladder fossa. No
biliary dilatation.

Pancreas: Unremarkable. No pancreatic ductal dilatation or
surrounding inflammatory changes.

Spleen: Normal in size without focal abnormality.

Adrenals/Urinary Tract: Adrenal glands are within normal limits.
Diffuse cortical thinning of the kidneys. No hydronephrosis. The
bladder is unremarkable

Stomach/Bowel: Stomach is nonenlarged. No dilated small bowel. No
colon wall thickening. Sigmoid colon diverticular disease.

Vascular/Lymphatic: Extensive aortic atherosclerosis without
aneurysmal dilatation. No significantly enlarged lymph nodes.

Reproductive: Status post hysterectomy. No adnexal masses.

Other: Negative for free air or free fluid.

Musculoskeletal: Degenerative changes of the spine. Trace
retrolisthesis of L1 on L2. Mild to moderate compression deformities
at T12 and T10.
IMPRESSION: 1. Coarse heterogeneous appearance of the liver with diffuse
decreased density suggesting steatosis or hepatocellular disease.
There are vague indeterminate hypoenhancing lesions within the dome
of the liver and right lobe, for which nonemergent liver MRI could
be helpful to further evaluate. There is no biliary dilatation in
this post cholecystectomy patient.
2. Sigmoid colon diverticular disease without acute inflammation
3. Small right pleural effusion with partial atelectasis at the
right base
4. Mild to moderate compression deformities at T10 and T12.

## 2018-11-03 IMAGING — DX DG CHEST 2V
2 series · 2 of 2 positions shown · non-contrast
Comparison: 06/12/2017, 09/10/2016

CLINICAL DATA: Fever

EXAM:
CHEST  2 VIEW

[chest pa]
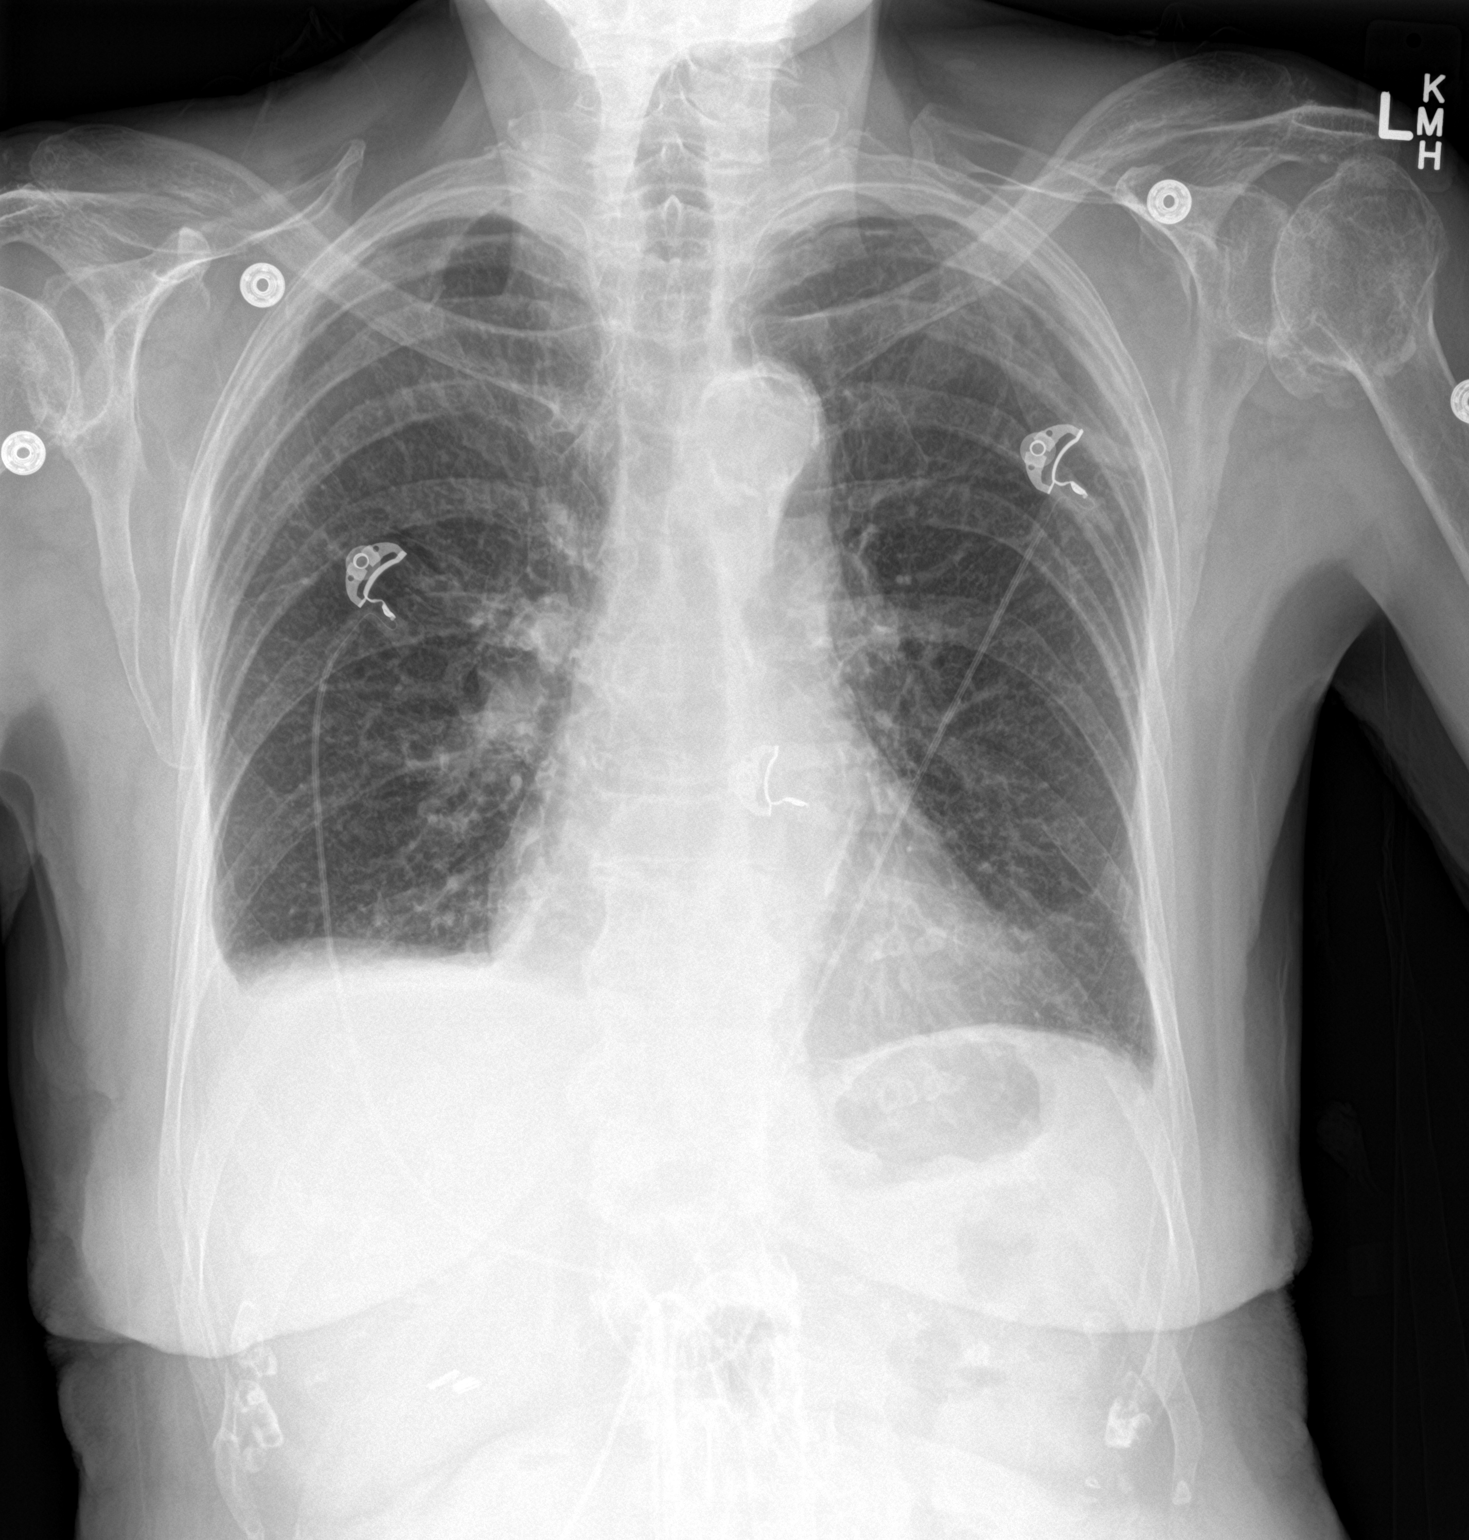

[chest lat]
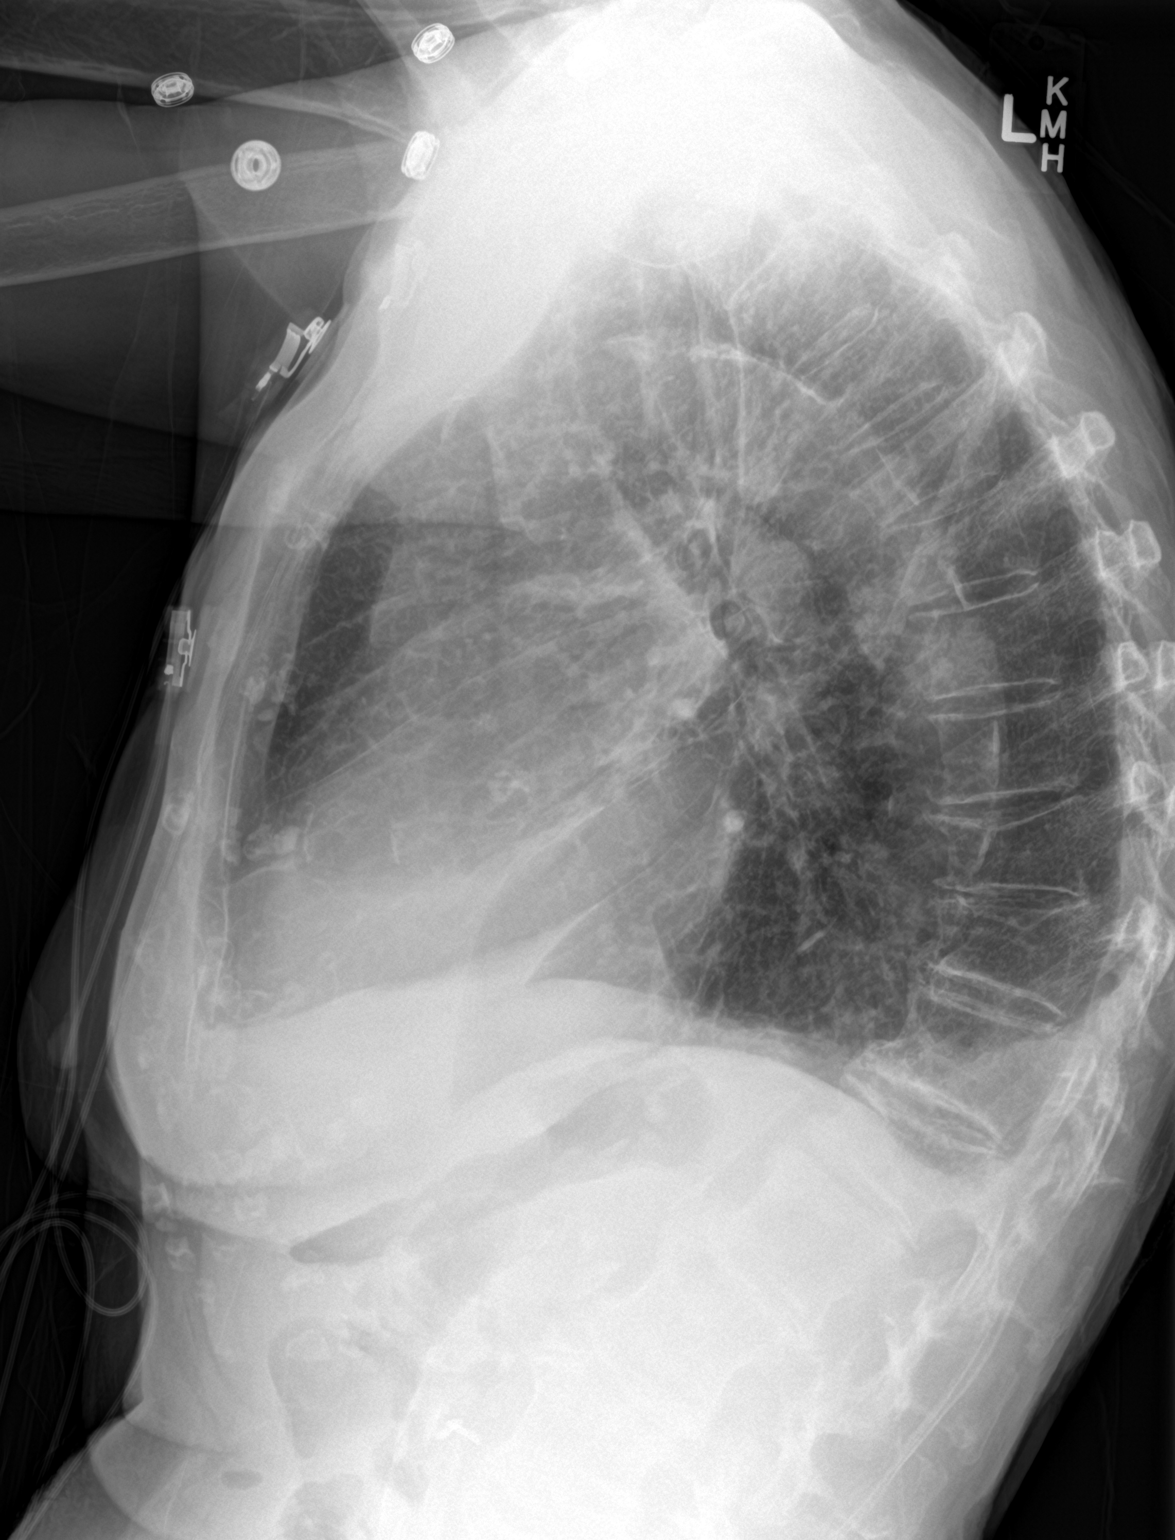

[2 of 2 positions shown; findings below may reference images not displayed]

FINDINGS: Negative for heart failure. Atherosclerotic calcification aortic
arch. Mild right pleural effusion with mild right lower lobe
atelectasis. No definite pneumonia.

Multiple mild chronic compression fractures in the thoracic spine
with kyphosis. Advanced degenerative change left shoulder joint
IMPRESSION: Mild right lower lobe atelectasis and small right effusion. No
definite pneumonia.

## 2019-02-05 DEATH — deceased
